# Patient Record
Sex: Male | Born: 1950 | Race: White | Hispanic: No | Smoking: Former smoker
Health system: Southern US, Community
[De-identification: ages and names within clinical notes are randomized; demographics above are authoritative.]

## PROBLEM LIST (undated history)

## (undated) DIAGNOSIS — J449 Chronic obstructive pulmonary disease, unspecified: Secondary | ICD-10-CM

## (undated) DIAGNOSIS — Z87442 Personal history of urinary calculi: Secondary | ICD-10-CM

## (undated) DIAGNOSIS — I10 Essential (primary) hypertension: Secondary | ICD-10-CM

## (undated) DIAGNOSIS — R0600 Dyspnea, unspecified: Secondary | ICD-10-CM

## (undated) DIAGNOSIS — I509 Heart failure, unspecified: Secondary | ICD-10-CM

## (undated) DIAGNOSIS — K219 Gastro-esophageal reflux disease without esophagitis: Secondary | ICD-10-CM

## (undated) HISTORY — PX: CARPAL TUNNEL RELEASE: SHX101

## (undated) HISTORY — DX: Heart failure, unspecified: I50.9

## (undated) HISTORY — PX: FRACTURE SURGERY: SHX138

---

## 2003-02-11 ENCOUNTER — Encounter: Payer: Self-pay | Admitting: Emergency Medicine

## 2003-02-11 ENCOUNTER — Inpatient Hospital Stay (HOSPITAL_COMMUNITY): Admission: AC | Admit: 2003-02-11 | Discharge: 2003-03-15 | Payer: Self-pay

## 2004-02-12 ENCOUNTER — Inpatient Hospital Stay (HOSPITAL_COMMUNITY): Admission: RE | Admit: 2004-02-12 | Discharge: 2004-02-15 | Payer: Self-pay | Admitting: Orthopedic Surgery

## 2004-02-12 ENCOUNTER — Ambulatory Visit: Payer: Self-pay | Admitting: Internal Medicine

## 2004-02-13 ENCOUNTER — Ambulatory Visit: Payer: Self-pay | Admitting: Plastic Surgery

## 2004-03-02 ENCOUNTER — Ambulatory Visit: Payer: Self-pay | Admitting: Internal Medicine

## 2004-03-23 ENCOUNTER — Ambulatory Visit: Payer: Self-pay | Admitting: Internal Medicine

## 2004-05-11 ENCOUNTER — Inpatient Hospital Stay (HOSPITAL_COMMUNITY): Admission: RE | Admit: 2004-05-11 | Discharge: 2004-05-13 | Payer: Self-pay | Admitting: Orthopedic Surgery

## 2004-05-12 ENCOUNTER — Ambulatory Visit: Payer: Self-pay | Admitting: Physical Medicine & Rehabilitation

## 2004-06-01 ENCOUNTER — Ambulatory Visit: Payer: Self-pay | Admitting: Internal Medicine

## 2004-08-27 ENCOUNTER — Ambulatory Visit (HOSPITAL_COMMUNITY): Admission: RE | Admit: 2004-08-27 | Discharge: 2004-08-27 | Payer: Self-pay | Admitting: Orthopedic Surgery

## 2004-11-24 ENCOUNTER — Ambulatory Visit: Payer: Self-pay | Admitting: Internal Medicine

## 2004-12-08 ENCOUNTER — Ambulatory Visit: Payer: Self-pay | Admitting: Internal Medicine

## 2005-01-11 ENCOUNTER — Ambulatory Visit: Payer: Self-pay | Admitting: Internal Medicine

## 2005-01-18 ENCOUNTER — Ambulatory Visit (HOSPITAL_COMMUNITY): Admission: RE | Admit: 2005-01-18 | Discharge: 2005-01-18 | Payer: Self-pay | Admitting: Orthopedic Surgery

## 2005-02-15 ENCOUNTER — Ambulatory Visit: Payer: Self-pay | Admitting: Internal Medicine

## 2005-04-19 ENCOUNTER — Ambulatory Visit: Payer: Self-pay | Admitting: Internal Medicine

## 2005-05-19 ENCOUNTER — Encounter
Admission: RE | Admit: 2005-05-19 | Discharge: 2005-08-17 | Payer: Self-pay | Admitting: Physical Medicine & Rehabilitation

## 2005-05-19 ENCOUNTER — Ambulatory Visit: Payer: Self-pay | Admitting: Physical Medicine & Rehabilitation

## 2005-07-26 ENCOUNTER — Ambulatory Visit: Payer: Self-pay | Admitting: Internal Medicine

## 2005-08-10 ENCOUNTER — Ambulatory Visit: Payer: Self-pay | Admitting: Physical Medicine & Rehabilitation

## 2005-09-28 ENCOUNTER — Ambulatory Visit: Payer: Self-pay | Admitting: Physical Medicine & Rehabilitation

## 2005-09-28 ENCOUNTER — Encounter
Admission: RE | Admit: 2005-09-28 | Discharge: 2005-12-27 | Payer: Self-pay | Admitting: Physical Medicine & Rehabilitation

## 2005-11-03 ENCOUNTER — Ambulatory Visit: Payer: Self-pay | Admitting: Physical Medicine & Rehabilitation

## 2005-12-27 ENCOUNTER — Encounter
Admission: RE | Admit: 2005-12-27 | Discharge: 2006-03-27 | Payer: Self-pay | Admitting: Physical Medicine & Rehabilitation

## 2005-12-27 ENCOUNTER — Ambulatory Visit: Payer: Self-pay | Admitting: Physical Medicine & Rehabilitation

## 2006-02-21 ENCOUNTER — Ambulatory Visit: Payer: Self-pay | Admitting: Physical Medicine & Rehabilitation

## 2006-04-18 ENCOUNTER — Ambulatory Visit: Payer: Self-pay | Admitting: Physical Medicine & Rehabilitation

## 2006-04-18 ENCOUNTER — Encounter
Admission: RE | Admit: 2006-04-18 | Discharge: 2006-07-17 | Payer: Self-pay | Admitting: Physical Medicine & Rehabilitation

## 2006-07-18 ENCOUNTER — Encounter
Admission: RE | Admit: 2006-07-18 | Discharge: 2006-10-16 | Payer: Self-pay | Admitting: Physical Medicine & Rehabilitation

## 2006-07-18 ENCOUNTER — Ambulatory Visit: Payer: Self-pay | Admitting: Physical Medicine & Rehabilitation

## 2006-11-07 ENCOUNTER — Ambulatory Visit: Payer: Self-pay | Admitting: Physical Medicine & Rehabilitation

## 2006-11-07 ENCOUNTER — Encounter
Admission: RE | Admit: 2006-11-07 | Discharge: 2006-11-09 | Payer: Self-pay | Admitting: Physical Medicine & Rehabilitation

## 2007-03-06 ENCOUNTER — Encounter
Admission: RE | Admit: 2007-03-06 | Discharge: 2007-03-07 | Payer: Self-pay | Admitting: Physical Medicine & Rehabilitation

## 2007-03-06 ENCOUNTER — Ambulatory Visit: Payer: Self-pay | Admitting: Physical Medicine & Rehabilitation

## 2007-07-03 ENCOUNTER — Encounter
Admission: RE | Admit: 2007-07-03 | Discharge: 2007-07-30 | Payer: Self-pay | Admitting: Physical Medicine & Rehabilitation

## 2007-07-26 ENCOUNTER — Ambulatory Visit: Payer: Self-pay | Admitting: Physical Medicine & Rehabilitation

## 2007-08-29 ENCOUNTER — Encounter
Admission: RE | Admit: 2007-08-29 | Discharge: 2007-08-29 | Payer: Self-pay | Admitting: Physical Medicine & Rehabilitation

## 2007-11-16 ENCOUNTER — Encounter
Admission: RE | Admit: 2007-11-16 | Discharge: 2007-12-07 | Payer: Self-pay | Admitting: Physical Medicine & Rehabilitation

## 2007-12-07 ENCOUNTER — Ambulatory Visit: Payer: Self-pay | Admitting: Physical Medicine & Rehabilitation

## 2008-03-31 ENCOUNTER — Encounter
Admission: RE | Admit: 2008-03-31 | Discharge: 2008-04-18 | Payer: Self-pay | Admitting: Physical Medicine & Rehabilitation

## 2008-04-18 ENCOUNTER — Ambulatory Visit: Payer: Self-pay | Admitting: Physical Medicine & Rehabilitation

## 2008-06-17 ENCOUNTER — Encounter
Admission: RE | Admit: 2008-06-17 | Discharge: 2008-09-15 | Payer: Self-pay | Admitting: Physical Medicine & Rehabilitation

## 2008-06-18 ENCOUNTER — Ambulatory Visit: Payer: Self-pay | Admitting: Physical Medicine & Rehabilitation

## 2008-07-18 ENCOUNTER — Ambulatory Visit: Payer: Self-pay | Admitting: Physical Medicine & Rehabilitation

## 2008-08-12 ENCOUNTER — Ambulatory Visit: Payer: Self-pay | Admitting: Physical Medicine & Rehabilitation

## 2008-08-12 ENCOUNTER — Encounter
Admission: RE | Admit: 2008-08-12 | Discharge: 2008-10-21 | Payer: Self-pay | Admitting: Physical Medicine & Rehabilitation

## 2008-10-21 ENCOUNTER — Ambulatory Visit: Payer: Self-pay | Admitting: Physical Medicine & Rehabilitation

## 2008-11-14 ENCOUNTER — Encounter
Admission: RE | Admit: 2008-11-14 | Discharge: 2009-02-05 | Payer: Self-pay | Admitting: Physical Medicine & Rehabilitation

## 2008-11-18 ENCOUNTER — Ambulatory Visit: Payer: Self-pay | Admitting: Physical Medicine & Rehabilitation

## 2008-12-17 ENCOUNTER — Encounter
Admission: RE | Admit: 2008-12-17 | Discharge: 2009-03-17 | Payer: Self-pay | Admitting: Physical Medicine & Rehabilitation

## 2008-12-17 ENCOUNTER — Ambulatory Visit: Payer: Self-pay | Admitting: Physical Medicine & Rehabilitation

## 2009-01-14 ENCOUNTER — Ambulatory Visit: Payer: Self-pay | Admitting: Physical Medicine & Rehabilitation

## 2009-02-12 ENCOUNTER — Ambulatory Visit: Payer: Self-pay | Admitting: Physical Medicine & Rehabilitation

## 2009-03-04 ENCOUNTER — Encounter
Admission: RE | Admit: 2009-03-04 | Discharge: 2009-04-28 | Payer: Self-pay | Admitting: Physical Medicine & Rehabilitation

## 2009-03-13 ENCOUNTER — Ambulatory Visit: Payer: Self-pay | Admitting: Physical Medicine & Rehabilitation

## 2009-04-15 ENCOUNTER — Ambulatory Visit: Payer: Self-pay | Admitting: Physical Medicine & Rehabilitation

## 2009-05-07 ENCOUNTER — Encounter
Admission: RE | Admit: 2009-05-07 | Discharge: 2009-08-04 | Payer: Self-pay | Admitting: Physical Medicine & Rehabilitation

## 2009-05-10 ENCOUNTER — Emergency Department: Payer: Self-pay | Admitting: Internal Medicine

## 2009-07-09 ENCOUNTER — Ambulatory Visit: Payer: Self-pay | Admitting: Physical Medicine & Rehabilitation

## 2009-08-04 ENCOUNTER — Encounter
Admission: RE | Admit: 2009-08-04 | Discharge: 2009-10-26 | Payer: Self-pay | Admitting: Physical Medicine & Rehabilitation

## 2009-08-07 ENCOUNTER — Ambulatory Visit: Payer: Self-pay | Admitting: Physical Medicine & Rehabilitation

## 2009-09-10 ENCOUNTER — Ambulatory Visit: Payer: Self-pay | Admitting: Physical Medicine & Rehabilitation

## 2009-10-15 ENCOUNTER — Ambulatory Visit: Payer: Self-pay | Admitting: Physical Medicine & Rehabilitation

## 2009-10-26 ENCOUNTER — Encounter
Admission: RE | Admit: 2009-10-26 | Discharge: 2009-11-03 | Payer: Self-pay | Admitting: Physical Medicine & Rehabilitation

## 2009-11-03 ENCOUNTER — Ambulatory Visit: Payer: Self-pay | Admitting: Physical Medicine & Rehabilitation

## 2010-09-14 NOTE — Assessment & Plan Note (Signed)
Theodore Obrien returns to clinic today for followup evaluation.  The patient  reports that he has been approved for disability.  He reports that he is  allowed to do a minimum amount of work and get paid in addition to his  disability payments.  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   In terms of his pain medicines, he is using his OxyContin CR 40 mg twice  a day and the oxycodone 5 mg up to 8 tablets per day.  He does need a  refill on each of those over the next couple of weeks.   MEDICATIONS:  1. OxyContin extended release 40 mg q.12 h.  2. Oxycodone immediate release 5 mg 1-2 tablets p.o. q.i.d. p.r.n. (6-      8 per day).  3. Neurontin 600 mg q.i.d.   REVIEW OF SYSTEMS:  Positive for night sweats, high blood sugar, nausea,  urinary retention, and poor appetite.   PHYSICAL EXAMINATION:  GENERAL:  Well-appearing adult male in mild to no  acute discomfort.  VITAL SIGNS:  Blood pressure was 187/101 in the left upper extremity and  155/87 in the right upper extremity.  Pulse was 110 with respiratory  rate of 22, and O2 saturation 97% on room air.  EXTREMITIES:  He ambulates without any assistive device.   IMPRESSION:  1. Persistent severe pain of the right foot with numbness of the feet      bilaterally to knee level.  2. Status post left carpal tunnel release with planned right carpal      tunnel release.   In the office today, we did refill the patient's oxycodone immediate  release and continuous release each as of December 21, 2007.  We will plan  on seeing the patient in follow up in this office in approximately 3-4  months time with refills prior to that appointment as necessary.  The  patient continues to get good analgesic effect overall without signs of  diversion with significant side effects.           ______________________________  Theodore Obrien, M.D.     DC/MedQ  D:  12/07/2007 11:45:28  T:  12/08/2007 05:14:13  Job #:  [id number]

## 2010-09-14 NOTE — Assessment & Plan Note (Signed)
Mr. Huhn was a prior patient of Dr. Thomasena Edis.  He is back in followup of  his painful peripheral neuropathy and prior right tibial fracture.  He  suffered his neuropathy due to side effects of antibiotic therapy.  The  patient is currently taking OxyContin 40 mg q.12 h and oxycodone 5 mg 1-  2 q.6 h p.r.n..  He needs usually 8 a day to cover his pain.  He is on  Neurontin 600 mg q.i.d. which helps somewhat as well.  Says he has tried  Lyrica before without significant benefit.  He also has tried Elavil at  some point at low dose.  The patient rates his pain as 7/10.  Described  as aching and burning.  Pain interferes with general activity, relations  with others, and enjoyment of life on a moderate-to-severe level.  Sleep  is poor.  He can walk about 10 minutes at a time due to foot pain.   REVIEW OF SYSTEMS:  Notable for numbness, trouble walking, tingling,  dizziness, confusion, depression, anxiety, nausea, urine retention,  weight gain.  Full review is in the written health and history section.  Other pertinent positives are above.   SOCIAL HISTORY:  Essentially is unchanged.  The patient has been on  disability since this summer.  He was unable to work due to his  problems.   PHYSICAL EXAMINATION:  VITAL SIGNS:  Blood pressure is 164/75, pulse is  101, respiratory rate 18.  He is sating 98% on room air.  GENERAL:  The patient is generally pleasant, alert and oriented x3.  HEART:  Regular rhythm, slightly tachycardiac.  CHEST:  Clear.  ABDOMEN:  Soft, nontender.  EXTREMITIES:  Strength is generally 5/5 in all 4 extremities.  He has  decreased pinprick and light touch in both hands in the distal fingers.  He has decreased sensation essentially below the ankle on either foot.  I grade that sensation to trace out of 2 in general.  There is no  obvious color abnormalities or symmetry abnormalities.  Pulses are 2+.  He has antalgic gait on either leg.  NEUROLOGIC:  Cognitively, he is  appropriate.   ASSESSMENT:  1. 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  2. History of right tibial fracture.  3. History of bilateral carpal tunnel syndrome.   PLAN:  1. I told Helen that our goal would be to decrease his oxycodone      breakthrough use.  I really do not want to increase him to 15 mg      breakthrough medication at this point.  He is on OxyContin 40 mg      q.12 h.  We will stay with the same schedule medications for now.      We will continue with Neurontin 600 mg q.i.d.  I did start Voltaren      gel t.i.d. to hand and feet.  2. We will add Topamax 25 mg at bedtime titrating up to 75 mg over 2      weeks' time.  3. I will have him follow up with Nurse [facility] in 1 month and with me      in 2 months' time.      Ranelle Oyster, M.D.  Electronically Signed     ZTS/MedQ  D:  07/18/2008 09:53:05  T:  07/19/2008 00:01:15  Job #:  [id number]

## 2010-09-14 NOTE — Assessment & Plan Note (Signed)
[name] returns to the clinic today for followup evaluation.  He  reports that he is doing well.  He does need a refill on his OxyContin  and oxycodone in the office today.  He is getting good relief overall  with the medication.  He does have a sufficient supply of Neurontin with  1 refill at present.   MEDICATIONS:  1. OxyContin extended release 40 mg q.12 h.  2. Oxycodone immediate release 5 mg 1-2 tablets p.o. q.i.d. p.r.n.      (generally 8 per day).  3. Neurontin 600 mg q.i.d.   REVIEW OF SYSTEMS:  Positive for urinary retention.   PHYSICAL EXAMINATION:  GENERAL:  Well-appearing, middle-aged adult male,  in mild to no acute discomfort.  VITAL SIGNS:  Blood pressure 145/84 with pulse 97, respiratory rate 18,  and O2 saturation 97% on room air.  He ambulates without any assistive  device.  He has 4+ to 5-/5 strength throughout.   IMPRESSION:  1. Persistent severe pain at the right foot with numbness of the feet      bilaterally to knee level.  2. Status post left carpal tunnel release with planned, right carpal      tunnel release.   In the office today, we did refill the patient's OxyContin CR and his  OxyContin immediate release, each as of today April 18, 2008.  No  refill on the Neurontin is necessary.  We will plan on seeing him in  follow up in approximately 3 months' time.  He continues to get good  analgesic effect without signs of diversion or significant side effects.           ______________________________  Ellwood Dense, M.D.     DC/MedQ  D:  04/18/2008 13:39:46  T:  04/19/2008 05:44:15  Job #:  [id number]

## 2010-09-14 NOTE — Assessment & Plan Note (Signed)
NARRATIVE:  Mr. Massmann returned to the clinic today for followup  evaluation.  The new job that he has with [employer] has been causing more  stress.  He is having a lot of stress related to poor software at work.  He also has to do a lot more walking between buildings, and both of  those have resulted in slightly increased use of the immediate release  oxycodone.  He does need a refill on each of the medicines starting  tomorrow.  His feet have felt worse with increased walking, possibly  related to the stress also.   MEDICATIONS:  1. OxyContin extended release 40 mg q.12 h.  2. Oxycodone immediate release 5 mg, 1-2 tablets p.o. t.i.d. p.r.n. (6-      8 per day).  3. Neurontin 600 mg q.i.d.   REVIEW OF SYSTEMS:  Positive for nausea.   PHYSICAL EXAMINATION:  GENERAL:  A well-appearing fit adult male in mild  acute discomfort.  VITAL SIGNS:  Not obtained in the office today.  MUSCULOSKELETAL:  He has 5/5 strength throughout.   IMPRESSION:  1. Persistent severe pain of the right foot with numbness of the feet      bilaterally to the knee level.  2. Status post carpal tunnel release with planned right carpal tunnel      release.  3. In the office today, we did refill the patient's extended release      oxycodone and his immediate release oxycodone.  We increased him up      to 8 tablets per day of the immediate release on an as-needed      basis.  Each of those will be filled as of tomorrow, March 08, 2007.   FOLLOWUP:  We will plan on seeing him in followup in approximately 4  months' time.           ______________________________  Ellwood Dense, M.D.     DC/MedQ  D:  03/07/2007 09:11:19  T:  03/07/2007 14:55:39  Job #:  [id number]

## 2010-09-14 NOTE — Assessment & Plan Note (Signed)
Mr. Blatz returns to the clinic today for followup evaluation. 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 He has  been working with them for three months and doing well. He reports that  he continues to get good relief from his pain medicines noted below. He  is off Cymbalta and reports no adverse effects off the medication  compared to on the medication. He reports that his pain is still present  in his bilateral feet, especially if he is up on his feet walking long  distances. He does report that the pain is variable and intermittent.   MEDICATIONS:  1. OxyContin extended release 40 mg q12 hours.  2. Oxycodone immediate release 5 mg 1-2 tablets p.o. t.i.d. p.r.n.  (6      per day).  3. Neurontin 600 mg q.i.d.   REVIEW OF SYSTEMS:  Noncontributory.   PHYSICAL EXAMINATION:  Well-appearing, fit, adult male in mild to no  acute discomfort. Blood pressure 161/103 with a pulse of 81, respiratory  rate 17 and O2 saturation is 94% on room air.  The patient has 5/5 strength throughout the bilateral upper and lower  extremities.   IMPRESSION:  1. Persistent severe pain of the right foot with numbness of the feet      bilaterally to knee level.  2. Status post left carpal tunnel release with planned right carpal      tunnel release.  3. In the office today, we did refill the patient's OxyContin      sustained release along with his immediate release and Neurontin      each as of November 13, 2006. Will plan on seeing him in followup in      approximately 4 months time with refills prior to that appointment      as necessary.           ______________________________  Ellwood Dense, M.D.     DC/MedQ  D:  11/09/2006 09:55:22  T:  11/09/2006 19:06:52  Job #:  [id number]

## 2010-09-14 NOTE — Assessment & Plan Note (Signed)
Theodore Obrien is back regarding his painful peripheral neuropathy.  He has  been generally under fair control.  He was on vacation and ran out of  his OxyContin and had some withdrawal symptoms.  He has been taking  Topamax and has noticed a big difference and has had no side effects  worth mentioning except for a bit of dry mouth.  He reports his pain is  7-9/10, describes it as tingling and burning.  Pain interferes with  general activity, relations with others, enjoyment of life on a moderate  level.  Sleep is fair.   REVIEW OF SYSTEMS:  Notable for the above, loss and dizziness,  occasional nausea, and some weight loss.  Full 14-point review is in the  written health and history section above.   SOCIAL HISTORY:  Unchanged.  He is still on disability and trying to  workout longer-term health insurance.   PHYSICAL EXAMINATION:  Blood pressure is 168/83, pulse is 104,  respiratory rate 18.  He is sating 96% on room air.  The patient is  pleasant, alert and oriented x3.  Affect is bright and appropriate.  Gait is essentially stable.  He had decreased in pinprick in the distal  hands as well as the feet and stocking-glove distribution.  Sensation is  trace to 1/2.  No skin color changes noted.  Pulses are 2+.  He has some  generalized antalgia on either leg with walking.  Cognitively, he is  appropriate.   ASSESSMENT:  1. 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  2. History of right tibial fracture.  3. Bilateral carpal tunnel syndrome.   PLAN:  1. We will increase Topamax to 100 mg h.s.  titrating up to 200 mg as      tolerated to see if he has an effect.  2. We will continue OxyContin 40 mg q.12 h. #60 and oxycodone 5 mg 1-2      q.6 h. p.r.n. #240.  3. Gave the patient literature regarding a spinal stimulator which he      will review.  4. Continue Neurontin 600 mg q.i.d.  5. We will see him back in 3 months with me.      Theodore Obrien, M.D.  Electronically Signed     ZTS/MedQ  D:  10/21/2008 10:01:07  T:  10/21/2008 84:69:62  Job #:  [id number]

## 2010-09-14 NOTE — Assessment & Plan Note (Signed)
Mr. Maye returns to the clinic today for followup evaluation.  He  reports that he was laid off and actually released in late December 2008  to early January 2009.  He reports that he has applied for disability as  he does not feel that he can continue to work secondary to the pain from  his right foot and also the pain medications that he takes.  He reports  that he was sent to a psychiatrist by Social Security/Disability for  memory testing.  He also reports that he may be hiring an attorney to  help him with his disability.  The disability people have told him that  the initial decision may take 4-6 months and he is prepared for that at  this time.  He is also reporting that he is planning to marry probably  in April 2009.   MEDICATIONS:  1. Oxycodone extended release 40 mg q.12 hours.  2. Oxycodone immediate release 5 mg one to two tablets p.o. q.i.d.      p.r.n. (6-8 per day).  3. Neurontin 600 mg q.i.d.   REVIEW OF SYSTEMS:  Noncontributory.   PHYSICAL EXAMINATION:  GENERAL:  Well-appearing thin adult male.  Mild  acute discomfort.  VITAL SIGNS:  Blood pressure 145/99, pulse 83, respiratory rate 18, O2  saturation 98% on room air.  He is 5-/5 strength throughout.  He  ambulates without any assistive device.   IMPRESSION:  1. Persistent severe pain of the right foot with numbness of the feet      bilaterally to knee level.  2. Status post carpal tunnel release with planned right carpal tunnel      release.   In the office today, we did refill the patient's oxycodone immediate  release and continuous release as of today, July 30, 2007.  That is  slightly early from his last refill done June 05, 2007.  He reports  that he has been slightly sick and nauseated recently with some vomiting  and he has taken slightly increased amounts of medications secondary to  the vomiting.   We will plan to see the patient in followup in approximately 4 months'  time.  Refills of his  medications will be completed prior to that office  visit.           ______________________________  Ellwood Dense, M.D.     DC/MedQ  D:  07/30/2007 13:33:15  T:  07/30/2007 14:06:32  Job #:  [id number]
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
## 2010-09-17 NOTE — Assessment & Plan Note (Signed)
Theodore Obrien returns to clinic today for follow-up evaluation. I last saw  him in this office February 22, 2006. Since that time he had carpel  tunnel surgery on the left with Dr. Carola Frost, April 16, 2006. He did use  a few extra immediate release oxycodone initially after that surgery  this week. He does not need a refill on his Neurontin, but does need a  refill on his immediate release oxycodone along with his extended-  release oxycodone. He reports that he was laid off from his work at  [employer] approximately Thanksgiving 2007. He is in the process of trying  to set up [employer]. He still needs to have carpel tunnel release  on the right side in the near future.   MEDICATIONS:  1. Oxycodone extended release 40 mg q. 12 hours.  2. Oxycodone immediate release 5 mg 1-2 tablets p.o. t.i.d. p.r.n.  3. Cymbalta 60 mg q. day.  4. Neurontin 600 mg q.i.d.   REVIEW OF SYSTEMS:  Positive for nausea along with urinary retention.   PHYSICAL EXAMINATION:  A well-appearing adult male in mild-to-no acute  discomfort. Blood pressure 162/100 with pulse of 82, respiratory rate  16, and O2 saturation of 97% on room air. He has 4/5 strength throughout  the bilateral upper extremities with some decreased strength in the left  wrist where he has wrist splint in place. He also has a splint present  on the right wrist. Lower extremity exam showed 5/5 strength throughout.   IMPRESSION:  1. Persistent severe pain of the right foot along with numbness of the      feet bilaterally through knee level.  2. Status post left carpel tunnel release with imminent right carpel      tunnel release planned.   At the present time we have refilled his oxycodone immediate release  along with his extended release as of April 27, 2006. Will plan on  seeing the patient in followup in approximately two to three months with  refills prior to that appointment if necessary. He continues to be very  compliant with all  restrictions regarding narcotic use.           ______________________________  Theodore Obrien, M.D.     DC/MedQ  D:  04/20/2006 10:39:07  T:  04/20/2006 12:43:50  Job #:  [id number]
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
## 2010-09-17 NOTE — Op Note (Signed)
NAME:  [name], [name] NO.:  [id number]   MEDICAL RECORD NO.:  [id number]          PATIENT TYPE:  AMB   LOCATION:  SDS                          FACILITY:  [facility]   PHYSICIAN:  Doralee Albino. Carola Frost, M.D. 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   PROCEDURE:  1.  Removal of right tibial nail (implant, deep).  2.  Intramedullary debridement with reaming and curettage of all locking      bolt sites.   SURGEON:  Myrene Galas, M.D.   ASSISTANT:  Peter Garter, PA   ANESTHESIA:  General.   COMPLICATIONS:  None.   SPECIMENS:  Four specimens, 2 from the intramedullary nail fibrous tissue  and 2 from tibial reamings.   DRAINS:  None.   DISPOSITION:  To PACU.   CONDITION:  Stable.   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  This included  placement of antibiotic rod, osteotomy coupled with nailing and iliac crest  bone grafting, and exchange nailing.  Throughout this time, he has been on  antibiotics and has been followed by Dr. Orvan Falconer from Infectious Disease.  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  After discussion with Dr. Orvan Falconer and following  serial x-rays, we offered Theodore Obrien removal of the intramedullary nail with  cast bracing and continuation of the antibiotics for 10 days or so.  This  was based upon the clinical union which he had attained including total lack  of tenderness, complete return to function, ability to jump without any pain  and no symptoms after prolonged activity.  In spite of this, he did not have  convincing radiographic union, but again, Dr. Orvan Falconer felt that  discontinuation of antibiotics was a pressing need.  After full discussion  of the risks and benefits including the possibility of refracture, continued  infection or recurrent infection and possible need for further surgery, the  patient wished to proceed up.  He understood thromboembolic risk as well as  other perioperative risk.   BRIEF DESCRIPTION OF PROCEDURE:  Theodore Obrien was taken to the operating room  after receiving preoperative vancomycin.  His right lower extremity was  prepped and draped in the usual sterile fashion.  No tourniquet was used.  The old incision was remade proximally and the proximal aspect of the tibial  nail cleaned with a curette to remove any fibrinous material.  Next, the  locking bolts were all withdrawn with the exception of partial withdrawal of  one of the distal locks to prevent rotation during engagement of the  extraction device in the proximal aspect of the nail.  The extraction device  was engaged, the locking bolt removed and then the nail tracked without  complication.  It should be noted that the distal locking bolts were well  affixed and that the proximal locking bolt was partially threaded was felt  to have purchase as well.  No purulence was encountered and no significant  or unusual drainage.  A guidewire was then placed into the canal and  sequential reaming performed from 11.5 to 13.5, which was the amount of  reaming the patient had previously undergone.  After removal of the nail, we  sent cultures from the debris within the distal aspect of the nail; we also  sent another set of cultures from the tibial reamings after the 13-mm reamer  passage.  The bone felt solid during reaming, there was no gross motion and  copious irrigation was performed, allowing egress through the distal locking  bolts.   The entire tibia was then scanned with multiple fluoro images to  ensure no complications and to obtain a baseline picture of the fracture  site as well.  Again, it appeared to be bridged nicely on 4 cortices.  The  patient's wounds were then irrigated and closed in a standard layered  fashion with PDS and nylon.  A sterile gently compressive dressing was  applied and the patient taken to the PACU in stable condition.   PROGNOSIS:  Theodore Obrien appears to have resolved his infection, based on  negative inflammatory markers preoperatively, well-fixed locking bolts at  removal and the absence of any purulent debris.  The reaming and debridement  of his bone and locking bolt should further reduce his bacterial load if one  is present.  We are hopeful we can go on and completely resolve this  infection, as he has sustained significant side-effects from his antibiotic  regimen.  We will allow him to begin progressive weightbearing with external  support either from his Cam boot or the clamshell tibial orthosis which has  been ordered for him.  I will discontinued the antibiotics in another 10-14  days and follow up with me in approximately 10 days for removal of his  sutures.  He will not receive any formal DVT prophylaxis because of his  continued mobilization.  We still remain concerned about the possibility of  persistent infection as well as refracture.      Doralee Albino. Carola Frost, M.D.  Electronically Signed     MHH/MEDQ  D:  01/18/2005  T:  01/19/2005  Job:  [id number]

## 2010-09-17 NOTE — Assessment & Plan Note (Signed)
Theodore Obrien returns to the Clinic today ahead of his previously scheduled  appointment.  He is reporting increased pain on an occasional basis,  especially severe in the medial aspect of his left foot.  We have been using  OxyContin sustained release 40 mg q.12 h. and Oxycodone 5 mg up to 5 tablets  per day on an as needed basis.  He also uses Neurontin, and that dose has  recently been increased to 600 mg q.i.d.  We are reluctant to increase his  pain medications, explicit that there are no narcotics any further.  He is  not sure if he has ever tried Lyrica, although he feels he probably tried  some samples in the past.  I would like to at least try that medication to  see if we can help him with his intermittent pain.  Unfortunately, we may  eventually need to increase his narcotic dosage, but will try to avoid that  at least for now.   MEDICATIONS:  1.  OxyContin sustained release 4 mg q.12 h.  2.  Oxycodone 5 mg 1-2 tablets p.o. q.4-6 h. p.r.n. (approximately 5 per      day).  3.  Cymbalta 60 mg q.d.  4.  Gabapentin 600 mg q.i.d.   REVIEW OF SYSTEMS:  Positive for urinary retention.   PHYSICAL EXAMINATION:  GENERAL:  Well-appearing, fit adult male; in mild to  no acute discomfort.  VITAL SIGNS:  Blood pressure 135/83, pulse 77, respirations 16, O2  saturations 95% on room air.  EXTREMITIES:  He has 5/5 strength throughout the bilateral upper and lower  extremities.  He complains of severe pain along the medial aspect of his  left foot.  He ambulates without any assistive device.   IMPRESSION:  Persistent, severe pain of the right foot; along with numbness  at the bottom of the legs, up to the level of his knees.   PLAN:  No refill on his OxyContin or Ocycodone medication is necessary.  We  did give him samples of Lyrica at 50 mg, to use twice a day.  We will see  how he does on this medication over the next month.  We would like to see  him a follow-up in approximately 1 month's  time.           ______________________________  Ellwood Dense, M.D.     DC/MedQ  D:  09/29/2005 10:49:53  T:  09/29/2005 13:26:31  Job #:  [id number]

## 2010-09-17 NOTE — Assessment & Plan Note (Signed)
Theodore Obrien returned to the clinic today for follow-up evaluation.  He reports  that overall he still has persistent burning and tingling of his feet.  He  complains of them feeling like he is touching cacti.  He reports that he  continues to take is Cymbalta at 60 mg daily.  He reports that he gets good  relief with his pain medicines and they help him tolerate the pain that he  does have.  He reports that he could not take his pain medicine for a few  days secondary to nausea related to GI upset.  He noted that he had  increased pain when he was not taking his immediate-release and extended-  release Oxycodone.   MEDICATIONS:  1. Oxycodone extended-release 40 mg q.12 hours.  2. Oxycodone immediate-release 5 mg one to two tablets p.o. q.4-6 hours      (approximately 5-6 per days).  3. Cymbalta 60 mg daily.  4. Gabapentin 600 mg q.i.d.   REVIEW OF SYSTEMS:  Positive for urinary retention and nausea.   PHYSICAL EXAMINATION:  GENERAL:  A well-appearing, fit adult male in mild to  no acute discomfort.  VITAL SIGNS:  Blood pressure 150/80 pulse 92, respiratory rate 18, and O2  saturation 95% on room air.  He has 5/5 strength throughout the bilateral  upper and lower extremities.  Bulk and tone were normal.   IMPRESSION:  Persistent severe pain in the right foot along with numbness of  his feet bilaterally through knee level.   In the office today we did refill the patient's Oxycodone sustained-release  and Oxycodone Immediate-release as noted above.  He reports that he has a  sufficient supply of Neurontin at this point.  We will plan on seeing him in  follow-up in 2-3 months' time with refills prior to that appointment as  necessary.           ______________________________  Ellwood Dense, M.D.     DC/MedQ  D:  12/28/2005 16:46:22  T:  12/29/2005 13:38:08  Job #:  [id number]
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
## 2010-09-17 NOTE — Assessment & Plan Note (Signed)
DATE OF VISIT:  November 07, 2005   REASON FOR VISIT:  [name] returns to the clinic today for followup  evaluation.  He reports that overall he is getting good relief from his  OxyContin sustained release 40 mg q.12h. and his p.r.n. oxycodone.  He  reports that he gained no relief with the Lyrica that we had given him as  samples.  He also reports that he had a lot of stomach aches with the  medication.  He would prefer not to continue that.  He continues to use the  Gabapentin 600 mg four times a day.   The patient is reporting no adverse side-effects from the oxycodone and  OxyContin medications.  He does report that he takes a fair amount of  vitamin C for his overall health.   MEDICATIONS:  1.  Oxycodone extended release 40 mg q.12h.  2.  Oxycodone immediate release 5 mg one to two tablets p.o. q.4-6h. p.r.n.      (approximately 5 per day).  3.  Cymbalta 60 mg daily.  4.  Gabapentin 600 mg four times a day.   REVIEW OF SYSTEMS:  Positive for nausea along with urinary retention.   PHYSICAL EXAMINATION:  GENERAL:  A well-appearing, fit, adult male in mild  to no acute discomfort.  VITAL SIGNS:  Blood pressure 157/81 with a pulse of 94, respirations 16, O2  saturations 96% on room air.  NEUROLOGIC:  He has 5/5 strength throughout the bilateral upper and lower  extremities.  He complains of severe right foot pain that was worse over the  past few days when he had to do some particular work at his place of  employment.  He was up on his feet for a prolonged period of time and doing  a fair amount of lifting.  Since that time, the pain has improved to some  degree.   ASSESSMENT/PLAN:  1.  Persistent severe pain of the right foot along with numbness of his feet      up through his knee levels.  2.  In the office today, we did refill the patient's oxycodone and allowed      him to use an average of 5 per day with variation allowable.  He is also      using the oxycodone extended  release at 40 mg q.12h. and that was      refilled for him in the office today, both as of November 09, 2005.  We will      plan on seeing him in followup in approximately 2 months' time.  He will      not be using the Lyrica, but will continue on the Gabapentin as noted      above.           ______________________________  Ellwood Dense, M.D.     DC/MedQ  D:  11/07/2005 14:06:56  T:  11/07/2005 16:27:01  Job #:  [id number]

## 2010-09-17 NOTE — Assessment & Plan Note (Signed)
Mr. Zehren returns to clinic today for followup evaluation.  He reports  that he is doing well overall.  He continues to take the 2 oxycodone  medications as noted below.  He does need refills on each of those over  the next couple of days.   CURRENT MEDICATIONS:  1. Oxycodone extended release 40 mg q. 12 hours.  2. Oxycodone immediate release 5 mg 1 to 2 tablets p.o. t.i.d. (6 per      day).  3. Cymbalta 60 mg daily.  4. Neurontin 600 mg q.i.d.   REVIEW OF SYSTEMS:  Noncontributory.   PHYSICAL EXAMINATION:  Well-appearing adult male, in no acute  discomfort.  Blood pressure 148/83 with pulse 89.  Respiratory rate 16,  and 02 saturation 94% on room air.  The patient has 5/5 strength in  bilateral upper and lower extremities.   IMPRESSION:  1. Persistent severe pain of the right foot with numbness of the feet      bilaterally to the knee level.  2. Status post left carpal tunnel release with planned right carpal      tunnel release.   In the office today, we did refill the patient's oxycodone sustained  release along with his oxycodone immediate release.  We will plan on  seeing the patient in followup in approximately 4 months' time with  medication refills as necessary prior to that appointment.           ______________________________  Ellwood Dense, M.D.     DC/MedQ  D:  07/19/2006 12:11:12  T:  07/19/2006 14:40:41  Job #:  [id number]

## 2010-09-17 NOTE — Op Note (Signed)
NAME:  Theodore Obrien NO.:  [id number]   MEDICAL RECORD NO.:  [id number]          PATIENT TYPE:  INP   LOCATION:  2550                         FACILITY:  [facility]   PHYSICIAN:  Doralee Albino. Carola Frost, M.D. DATE OF BIRTH:  1951-03-15   DATE OF PROCEDURE:  02/12/2004  DATE OF DISCHARGE:                                 OPERATIVE REPORT   PREOPERATIVE DIAGNOSIS:  Right tibial infected nonunion.   POSTOPERATIVE DIAGNOSIS:  Right tibial infected nonunion.   PROCEDURE:  1.  Irrigation and debridement of right tibia nonunion site with insertion      of an antibiotic cement rod.  2.  Irrigation and debridement of soft tissue abscess and free flap.  3.  Removal of hardware, right tibial Depuy nail.   SURGEON:  Doralee Albino. Carola Frost, M.D.   ASSISTANT:  Cecil Cranker, P.A.-C.   ANESTHESIA:  General.   COMPLICATIONS:  None.   TOURNIQUET TIME:  None.   ESTIMATED BLOOD LOSS:  150 mL.   DISPOSITION:  To the PACU.   SPECIMENS:  Four, first from the free flap abscess, second from bone  nonunion site, third from bone nonunion site, fourth from intramedullary  reaming, right tibia.   CONDITION:  Good.   INDICATIONS FOR PROCEDURE:  Theodore Obrien is a patient of Dr. Cornelius Moras referred  for treatment of an infected nonunion of a grade 3B open tibial shaft  fracture treated with intramedullary nailing in October 2004 with a soleus  flap done by Dr. Odis Luster.  The patient, over the past month, experienced  increasing pain, redness, and swelling of the right tibia.  MRI was obtained  which showed a soft tissue abscess.  The abscess did rupture on the day  prior to surgery.  After discussing the risks and benefits of surgery, the  patient did elect to proceed.   DESCRIPTION OF PROCEDURE:  Theodore Obrien was taken to the operating room without  the administration of preoperative antibiotics.  His right lower extremity  was prepped and draped in the usual sterile fashion.  After induction  of  general anesthesia, Dr. Odis Luster had previously marked the edge of the soleus  flap and the proper incision.  This was followed with dissection carried  down to the anterior crest of the tibia at the margin of the soleus flap in  the lateral compartment.  The periosteum was elevated revealing a pocket of  purulence which extended over the medial aspect of the tibia underneath the  free flap.  This communicated directly with the abscess above.  The nonunion  site was curetted aggressively and rongeur used to remove much of the  fibrous nonunion tissue.  It was scraped back to bleeding bone, both the  proximal and distal end.  The intramedullary nail was then removed after  first securing the traction device in the nail and removing the locking  screws.  The nail was backed out and the canal reamed sequentially up to 12  mm.  The reamings were sent to microbiology as were other cultures obtained  including bone.  PulsaVac was then used to  copiously irrigate the nonunion  site and flap as well as irrigation down the canal.  The nonunion site  measured approximately 2 cm.  Following this pulsatile irrigation, an  antibiotic impregnated 300 mm rod was inserted.  The cement contained both  vancomycin and tobramycin.  Following reaming, the patient was administered  IV vancomycin empirically.  The incisions were then irrigated after  obtaining AP and lateral x-rays and closed in a layered fashion with 2-0 PDS  and simple nylon sutures for the flap area and 2-0 PDS for the knee incision  and simple nylon sutures for the locking bolt incision.  A sterile, gentle  compressive dressing was applied.  The patient was awakened from anesthesia  and transferred to the PACU in stable condition.   PROGNOSIS:  We anticipate that we will obtain an organism from Theodore Obrien's  surgery.  We will plan for placement of a PICC line and will consult  infectious disease to assist with management of this infection.   A course of  6-8 weeks of treatment is anticipated with subsequent return to the OR for  repeat debridement of his fracture site, placement of BMP as well as iliac  crest autograft, and fibular osteotomy with acute shortening of the tibia.  Dr. Odis Luster and myself will examine his wound tomorrow and plan a course of  treatment for his flap damage which will most likely consist of wet to dry  dressings.  It may be that at the time of his acute bony shortening, he may  also undergo revision of his flap that will be facilitated by that decreased  length.  He will be placed on DVT prophylaxis after placement of his PICC  line and will remain non-weight bearing on that extremity.       MHH/MEDQ  D:  02/12/2004  T:  02/12/2004  Job:  [id number]

## 2010-09-17 NOTE — Assessment & Plan Note (Signed)
[name] is back regarding his peripheral neuropathy.  He notes that he  has been more active over the summer and in general feeling better, but  had this more foot pain due to activity.  He tried the Topamax, but did  not notice any substantial benefit and weaned himself off.  He remains  on OxyContin 40 mg q.12 h and oxycodone 5 mg 1-2 q.6 h p.r.n.  He read  up on the spinal stimulator and was not very interested and is a bit  afraid of such a procedure.  He continues on Neurontin 600 mg q.i.d.  Pain is 6-7/10.  He described as a sharp, constant, burning.  Pain  interferes with general activity, relations with others, enjoyment of  life on a moderate level.  Sleep is poor as his pain is most prominent  at night.   REVIEW OF SYSTEMS:  Notable for the above as well as some depression,  anxiety, occasional confusion.  Other pertinent positives are above and  full 14-point review is in the written health and history section of the  chart.   SOCIAL HISTORY:  The patient is married.  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  He feels that the  activity helps to keep his mind off his pain.   PHYSICAL EXAMINATION:  VITAL SIGNS:  Blood pressure is 145/86, pulse 89,  respiratory rate 16.  He is sating 98% on room air.  GENERAL:  The patient is pleasant, alert, and oriented x3.  EXTREMITIES:  He continues to have some decreased pinprick and sensation  in the distal limbs.  He has some antalgia with gait.  No skin or  temperature color changes are noted.  NEUROLOGIC:  Cognitively, he is well within normal limits.  HEART:  Regular rate.  CHEST:  Clear.  ABDOMEN:  Soft, nontender.   ASSESSMENT:  1. 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  2. History of right tibial fracture.  3. Bilateral carpal tunnel syndrome.   PLAN:  1. We will introduce nortriptyline 25 mg nightly for neuropathic pain      to see if he has any benefit with this.  He  will continue with his      Neurontin 600 mg q.i.d. as well.  Consider increasing Neurontin to      900 mg in the future.  2. Refilled OxyContin 40 mg q.12 h #60 and oxycodone 5 mg 1-2 q.6 h      p.r.n. #240.  3. I will see the patient back in about 3 months with Nursing Clinic      followup in 1 months' time.  The patient will call me if any      questions or issues with his medications.      Theodore Obrien, M.D.  Electronically Signed     ZTS/MedQ  D:  01/14/2009 12:43:22  T:  01/15/2009 04:14:32  Job #:  [id number]
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
## 2010-09-17 NOTE — Discharge Summary (Signed)
NAME:  Theodore Obrien, OVENS NO.:  [id number]   MEDICAL RECORD NO.:  [id number]          PATIENT TYPE:  INP   LOCATION:  5012                         FACILITY:  [facility]   PHYSICIAN:  Doralee Albino. Carola Frost, M.D. DATE OF BIRTH:  [birth year]   DATE OF ADMISSION:  02/12/2004  DATE OF DISCHARGE:  02/15/2004                                 DISCHARGE SUMMARY   ADMISSION DIAGNOSIS:  Infected nonunion right tibial fracture.   DISCHARGE DIAGNOSIS:  Infected nonunion right tibial fracture status post  removal of tibial nail, insertion of antibiotic spacer insertion and  irrigation and debridement of infected flap.   CONSULTATIONS OBTAINED:  Dr. Etter Sjogren of plastic surgery.   PROCEDURE PERFORMED:  On February 12, 2004, a removal of tibial nail,  antibiotic spacer insertion and I&D of infected flap without complications.   HISTORY:  [name] is a patient of Dr. Cornelius Moras who is referred to [place] for an  infected nonunion of a grade IIIB open tibial shaft fracture treated in  October of 2004 with an intramedullary nailing with a psoas flap done by Dr.  Odis Luster.  Over the past month or so, he has been experiencing some increased  pain, redness and swelling of his right tibia and an MRI was obtained which  showed an abscess in the soft tissue.  On the day prior to surgery, the  abscess was ruptured.  At this time, the patient is electing to proceed with  examination.   PHYSICAL EXAMINATION:  GENERAL:  This is a 60 year old white male who is  well-developed and well-nourished and in no acute distress.  HEENT:  Pupils equal and reactive to light and accommodation.  Extraocular  movements intact.  Normocephalic, atraumatic.  NECK:  Supple.  LUNGS:  Clear to auscultation bilaterally.  HEART:  A regular rate and rhythm with a normal S1 and S2.  ABDOMEN:  Positive bowel sounds, nontender and soft.  SKIN:  Warm and dry.  There is a flap covering his right tibia that appears  to be infected at this  time.  EXTREMITIES:  The patient has positive motor and sensory in all extremities  bilaterally as well as positive dorsal pedal pulses bilaterally.   COURSE IN THE HOSPITAL:  The patient was admitted on February 12, 2004,  underwent a removal of a tibial nail with an antibiotic spacer insertion and  had I&D of the infected flap, tolerated the procedure well without  complications.  Infectious disease was consulted at that time, Dr. Cliffton Asters.  Empiric vancomycin was started pending the status of the strains  and cultures.  The spacer that was inserted had tobramycin and vancomycin in  it.  On February 13, 2004, the patient without complaints or concerns.  He  states the pain is well controlled at this time.  He remains resting  comfortably in bed, positive sensory in his right lower extremity including  deep peroneal nerve and superficial peroneal nerve and tibial nerve,  positive motor bilaterally, positive dorsal pedal pulses bilaterally.  Calf  is soft and nontender.  Laboratories are within normal limits and  dressing  is intact.  The patient is nonweightbearing with PT and OT at this time.  Plastic is going to see him at a later date.  Plastic saw him later in the  day, recommended saline dressing q.i.d. and t.i.d. at home.  Report that  wound should heal and he is to follow up with him in approximately two  weeks.  On February 14, 2004, the patient was seen, reports doing quite well.  No changes in exam from previous day.  He remains afebrile, vital signs  stable.  On February 15, 2004, the patient is seen, no complaints, ready to  go home.  Feels comfortable doing dressing changes at home, remains  afebrile.  Gram's smear of cultures were negative for growth x2 days.  Still  waiting final recommendations from Infectious Disease as far as antibiotic  therapy.  He has been set up with home antibiotic with vancomycin at 2 grams  IV q.24h. and Avelox 500 mg p.o. daily.  The patient at  this point is  discharged.   DISCHARGE CONDITION:  Good.   DISPOSITION:  Discharged to home.   MEDICATIONS:  1.  Vancomycin 2 grams q.24h.  2.  Avelox 500 mg p.o. daily.  3.  Percocet 5/325 p.r.n. pain.   On February 13, 2004, PICC line was also inserted for the patient so he could  have home antibiotic therapy.   DISCHARGE INSTRUCTIONS:  The patient is nonweightbearing on the right lower  extremity.  Physical therapy and occupational therapy to work with range of  motion.  Diet is as tolerated.  Dressing changes t.i.d. to infected flap  area as per plastics. The patient is to follow up with Dr. Myrene Galas in  approximately 10 days at [phone] for an appointment.  Also follow up with  Dr. Odis Luster as well as Dr. Orvan Falconer in their respective clinics.      Robe   RWC/MEDQ  D:  03/24/2004  T:  03/25/2004  Job:  [id number]
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
## 2010-09-17 NOTE — Op Note (Signed)
NAME:  Theodore Obrien, Theodore Obrien NO.:  [id number]   MEDICAL RECORD NO.:  [id number]          PATIENT TYPE:  INP   LOCATION:  5031                         FACILITY:  [facility]   PHYSICIAN:  Doralee Albino. Carola Frost, M.D. DATE OF BIRTH:  [birth year]   DATE OF PROCEDURE:  DATE OF DISCHARGE:  05/13/2004                                 OPERATIVE REPORT   DATE OF PROCEDURE:  May 11, 2004.   PREOPERATIVE DIAGNOSES:  1.  Infected nonunion, right tibia.  2.  Retained antibiotic rod, right tibia.   POSTOPERATIVE DIAGNOSES:  1.  Infected nonunion, right tibia.  2.  Retained antibiotic rod, right tibia.   PROCEDURE:  1.  Repair of nonunion with iliac crest bone grafting.  2.  Intramedullary nailing, right tibia, with DePuy tibial nail, 11 mm x 345      mm statically locked.  3.  Removal of antibiotic drug delivery implant.  4.  Fibular osteotomy.  5.  Debridement of full thickness free flap wound including skin,      subcutaneous tissue, and muscle.   SURGEON:  Doralee Albino. Carola Frost, MD.   ASSISTANT:  Cecil Cranker, MD.   ANESTHESIA:  General.   COMPLICATIONS:  None.   ESTIMATED BLOOD LOSS:  200 mL.   DISPOSITION:  To PACU.   CONDITION:  Stable.   BRIEF SUMMARY OF INDICATION FOR PROCEDURE:  Theodore Obrien, Theodore Obrien., is a 60-year-  old white male, well known to the Orthopedic practice for his open tibia  fracture sustained over one year ago with late development of infection and  symptomatic nonunion.  He underwent irrigation and debridement of his  infected nonunion site with removal of his intramedullary nail and placement  of an antibiotic-impregnated intramedullary rod approximately 10 weeks ago.  He was treated on IV Vancomycin.  He also had an associated full thickness  defect in his flap that was treated with localized wound care.  His C-  reactive protein and sed rate have returned to baseline levels, and his soft  tissues had nearly resolved their infection, although he  did continue to  have a draining sinus, but there was no associated purulence.  He had a  large defect over a centimeter, and after discussion of the risks and  benefits, he elected to proceed with an iliac crest bone grafting for his  nonunion site, intramedullary nailing, and fibular osteotomy with acute  shortening to improve bone apposition.  He understood the risks to include  neurovascular injury, continued nonunion, persistent infection,  thromboembolic complications, and the possible need for further surgery.   BRIEF SUMMARY OF PROCEDURE:  [name] was taken to the operating room where  his right lower extremity was prepped and draped in the usual sterile  fashion.  He was administered preoperative antibiotics because we had,  despite numerous attempts, failed to obtain an organism even with aggressive  culturing and bone biopsy and specimens.  Through a 4-cm incision, approach  was made to his right iliac crest where we were able to harvest a  considerable quantity of iliac crest bone.  This was  excellent cancellous  material, and we were able to use the trap door technique, folding back down  the trap door made with osteotomes with the hinge being medial and  posterior.  The soft tissues were repaired in layers after temporary  placement of a Gelfoam.  The 0 Vicryl, 2-0 Vicryl, and a running  subcuticular stitch were then used, and then Steri-Strips applied.  Attention was turned distally where we then made the old incision to the  knee, identified the tip of the antibiotic rod.  We also reentered along the  edge of the muscle flap and elevated this medially.  No gross purulence was  encountered; however, some fibrous tissue had reaccumulation.  Once again,  the bone ends were scraped thoroughly, and attention was directed toward  this full thickness defect in the flap, which continued to be a problem.  It  tracked directly down to the fracture site in the area in which we needed  to  place the iliac crest graft.  Consequently, the decision was made to proceed  with excision full thickness and then apposition with sutures.  We debrided  the skin and subcutaneous tissue and muscle all along this necrotic area  after scraping the margins with a curette.  We then used buried PDS sutures  and then nylon far-near, near-far sutures to reduce tension as we closed  this area.  We then took a bur to freshen the sclerotic bone ends as well as  a drill to get back to bleeding cancellous and cortical bone.  We did take  the bur posteriorly along the distal aspect of the tibia to bur down  posterior spikes so that we could achieve better apposition.  The antibiotic  rod was grasped with a Kocher and thrust proximally and then retrieved  through the knee.  We continued the burring process and the curettage  process and so forth.  We then inserted a guide rod and began sequentially  reaming, going up to 12.5.  This removed all the adherent fibrous tissue  within the canal, but did encounter a moderate amount of resistance.  Because we did not want to overream him and jeopardize his blood supply or  burn the bone in the intramedullary canal, we elected to proceed with  placement of an 11 mm intramedullary nail.  The nail was then inserted and  locked distally.  We chose the same size nail as was previously used because  this nail had been left slightly short and we knew that we would compress at  least a centimeter at the fracture site.  We locked it distally using the  perfect circle technique and then gently back slapped it to obtain excellent  apposition.  Prior to compressing at the fracture site, we did place some of  the cancellous iliac crest bone graft posteriorly around the fracture site.  Once the tibial cortex was apposed, we then locked the nail proximally, both  through a dynamic sliding hole as well as a static hole.  We wanted to minimize motion at the fracture site  during the initial stages of his  healing while providing for the possibility of dynamization at the three  month mark or so if needed.  Prior to back slapping the nail, we did  approach the fibula through a 3 cm lateral incision being visual and to  avoid injury to the peroneal nerve or vessel.  We used a large enough  incision to make certain that we did not  encounter this nerve and that we  had adequate exposure, a centimeter of bone was about 12 mm rather than bone  work size through this osteotomy.  We then took the remainder of the iliac  crest graft, which again was significant in quantity, and completely packed  it around and within the fracture site where possible.  We were able to  place a graft nearly circumferentially.  We then closed the area with a deep  PDS layer and then used 2-0 Vicryl and nylon for the area along the flap.  We used a Vicryl and staples at the knee in the locking bolt sites.  Sterile  dressing was applied, the patient awakened from anesthesia after application  of a posterior U-dressing, and transported to the PACU in stable condition.   PROGNOSIS:  Mr. Patsey Berthold prognosis is good as we were able to achieve a solid  bony apposition, we did not encounter any gross purulence, and we had an  excellent graft from his crest.  However, he certainly is at high risk for  persistent nonunion given the atrophic nature and prior infection in this  area.  Because he has been so active, we will place him on DVT prophylaxis  in the hospital only and then will allow him simply to continue with his  very active lifestyle.  He will ambulatory on postop day one if patterns  hold.  He will be touchdown weightbearing only on this side until he begins  to develop some evidence of healing, probably at the six-week mark, and then  we will rapidly progress weightbearing.  We will consider dynamization at  the three-month mark, and at this point he should develop enough stability   that, rather than bend at the fracture site with removal of the static nail,  he will only truly achieve axial compression.  He will remain on IV  Vancomycin, and fortunately he has tolerated this well without systemic  complication.      [place]   MHH/MEDQ  D:  05/13/2004  T:  05/13/2004  Job:  [id number]

## 2010-09-17 NOTE — Op Note (Signed)
NAME:  Theodore Obrien, Theodore Obrien NO.:  [id number]   MEDICAL RECORD NO.:  [id number]          PATIENT TYPE:  OIB   LOCATION:  2550                         FACILITY:  [facility]   PHYSICIAN:  Doralee Albino. Carola Frost, M.D. DATE OF BIRTH:  1951/04/12   DATE OF PROCEDURE:  08/27/2004  DATE OF DISCHARGE:  08/27/2004                                 OPERATIVE REPORT   PREOPERATIVE DIAGNOSIS:  Persistent right tibial nonunion.   POSTOPERATIVE DIAGNOSIS:  Persistent right tibial nonunion.   PROCEDURE:  1.  Right tibial exchange nailing and compression, using Howmedica T2 12mm X       2.  Fibular osteotomy.   SURGEON:  Doralee Albino. Carola Frost, M.D.   ASSISTANT:  Cecil Cranker, P.A.-C.   ANESTHESIA:  General.   COMPLICATIONS:  None.   DRAINS:  None.   SPECIMENS:  Two sent to microbiology for culture.   DISPOSITION:  To the PACU.   CONDITION:  Stable.   INDICATIONS FOR PROCEDURE:  Mr. Towry is a 60 year old well known to my  service for treatment of his persistent right tibial nonunion which was  treated back in January with removal of an antibiotic rod, fibular  osteotomy, and intramedullary nailing using iliac crest bone graft at the  site.  He has been followed for the last three months over which he has  begun weight-bearing as tolerated without complications other than mild  discomfort.  However, his x-rays demonstrated failure of his tibial nonunion  site to completely heal.  He has maintained IV antibiotics throughout this  course.  After discussing the risks and benefits, the patient wished to  proceed with exchange nailing of his tibia, repeat fibular osteotomy, and  use of a compression type nail device.   BRIEF SUMMARY OF PROCEDURE:  Mr. Milnes went to the operating room where his  11 by 345 mm nail was removed.  The tibia was then sequentially reamed up to  13.5.  A 12 mm nail was then inserted and locked distally.  A single  proximal lock was placed in the  Dynamic slot, as well.  The fibula was then  exposed laterally and a repeat 1 cm osteotomy was performed.  We did  separate the fibula from the tibia to allow for dynamization of the distal  tibia proximally.  The compression screw was then placed into the center of  the intramedullary nail and cranked until the Dynamic proximal screw was  engaged.  We then continued to apply compression resulting in bending both  the proximal and distal locks to maximize compression at the nonunion site.  This was watched under direct fluoroscopic imaging on the lateral and noted  to result in compression of the fracture site with generation of a solid  apposition and stability.  The wounds were then copiously irrigated and  closed in standard layered fashion.  The knee was washed, as well, and given  that a small incision was made, an 18 gauge needle was also used to extract  any additional collection within the knee joint, itself.  Sterile, gently  compressive dressing was  applied without a splint.  The patient was then  taken to the PACU in stable condition.  Reamings were sent to microbiology  during the procedure for culture and gram stain.   PROGNOSIS:  Mr. Glomski's prognosis with this fracture is fair.  It has  continued to manifest as a persistent nonunion despite aggressive treatment,  both of his infection as well as his fracture site.  We have applied  significant compressive forces to his fracture site and am hopeful that with  exchange nailing, this can ultimately lead to union.  It certainly should be  stable and he be allowed to weight-bear as tolerated and given his tolerance  of such procedures in the past, he will be discharged to home from the PACU  per his request.  Should he have any pain which is not well controlled, we  will be more than happy to admit him overnight or for two days.  It may be  that he requires additional osteotomy at some point proximal to this  nonunion site in  order to maximize compression or that he may need a second  iliac crest grafting, but we are certainly hopeful to avoid that as well as  possible circular frame should this not be successful.      MHH/MEDQ  D:  09/06/2004  T:  09/06/2004  Job:  [id number]
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
## 2010-09-17 NOTE — Assessment & Plan Note (Signed)
[name] returns to the clinic today for follow-up evaluation.  He reports  that he is getting good results overall with the OxyContin and oxycodone  noted below.  He reports that he has some increased pain, especially in his  foot on the right side if he is up for a long period of time at work.  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   MEDICATIONS:  1.  OxyContin 40 mg twice a day.  2.  Oxycodone 5 mg one to two tablets p.o. q.4-6h. p.r.n. (approximately      five per day).  3.  Cymbalta 60 mg daily.  4.  Gabapentin 600 mg t.i.d.   REVIEW OF SYSTEMS:  Positive for urinary retention and coughing along with  sinus problems.   PHYSICAL EXAMINATION:  GENERAL:  Well-appearing, fit adult male in mild to  no acute discomfort.  VITAL SIGNS:  Blood pressure 140/89 with a pulse of 107, respiratory rate  16, and O2 saturation 96% on room air.  NEUROLOGIC:  He has 5-/5 strength throughout the bilateral upper and lower  extremities.  Bulk and tone were normal.   IMPRESSION:  Persistent severe pain of the right foot along with numbness of  the bilateral legs up to the level of the knees.   In the office we refilled the patient's OxyContin medication 40 mg q.12h.  and oxycodone at 5 mg one to two tablets p.o. q.4-6h. p.r.n.  Those were  both written for August 13, 2005.  We will plan on seeing him in follow-up in  approximately two to three months' time.           ______________________________  Ellwood Dense, M.D.     DC/MedQ  D:  08/11/2005 10:10:20  T:  08/11/2005 11:31:58  Job #:  [id number]

## 2010-09-17 NOTE — Assessment & Plan Note (Signed)
Theodore Obrien returns to clinic today for followup evaluation.  He reports that  he is doing fairly well overall.  He has good days and bad days, but gets  reasonable relief from the combination of his oxycodone immediate release  and oxycodone sustained release.  He is using those as noted below and needs  a refill over the next few days.  He actually has approximately 41 tablets  of the oxycodone immediate release remaining and 15 tablets of the sustained  release, and that is dated from a prescription January 26, 2006.  He does  not use it quite as much as allowed and still gets reasonable relief.  He  does not need a refill on the Neurontin in the office today.   He has had some adjustment in his work schedule and is doing some extra  walking at his job that causes him some increased foot pain.   The patient has been diagnosed with mild carpal tunnel syndrome by Dr.  Thad Ranger on nerve conduction studies.  He is being referred to Dr. Carola Frost for  evaluation of carpal tunnel surgery.  Dr. Carola Frost apparently did some prior  right leg surgery for this gentleman.   MEDICATIONS:  1. Oxycodone extended release 40 mg q. 12 hours.  2. Oxycodone immediate release 5 mg 1 to 2 tablets p.o. t.i.d. p.r.n.  3. Cymbalta 60 mg.  4. Neurontin 600 mg q.i.d.   REVIEW OF SYSTEMS:  Positive for nausea and urinary retention.   PHYSICAL EXAM:  Well-appearing thin adult male in mild to no acute  discomfort.  Blood pressure 170/90, with a pulse of 116, respiratory rate 16, and an O2  saturation 94% on room air.  He has 5/5 strength to his bilateral upper and lower extremities.  Bulk and  tone were normal.  He had decreased sensation of the right foot along the  medial aspect.   IMPRESSION:  Persistent severe pain of the right foot along with numbness of  the feet bilaterally through knee level.   In the office today we did refill the patient's oxycodone sustained release,  along with his immediate release,  each as of March 02, 2006.  He does not  need a refill of his Neurontin in the office today.  We will plan on seeing  the patient in followup in this office in approximately 2 to 3 months' time  with refills prior to that appointment as necessary.           ______________________________  Ellwood Dense, M.D.     DC/MedQ  D:  02/22/2006 15:10:17  T:  02/23/2006 15:38:53  Job #:  [id number]
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
## 2010-09-17 NOTE — Assessment & Plan Note (Signed)
MEDICAL RECORD NUMBER:  [id number]   [name] returns to the clinic today for follow-up evaluation. He reports  that he is getting good relief from a combination of his oxycodone used four  to six tablets per day and his OxyContin used 40 mg twice a day. He does  need refills on both of those in the office today. He reports that the pain  never goes away but the medicines do make his pain more tolerable. He still  has pain at the balls of his feet which he complains is a bruised-like  feeling, especially after he is up walking for a prolonged period of time.  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 He has placed gel  pads in his shoes and that has helped with his foot pain.   MEDICATIONS:  1.  OxyContin 40 mg twice a day.  2.  Oxycodone 5 mg one to two tablets p.o. q.4-6h. p.r.n.  3.  Cymbalta 60 mg daily.  4.  Gabapentin 600 mg t.i.d.   REVIEW OF SYSTEMS:  Noncontributory.   PHYSICAL EXAMINATION:  Well-appearing, fit adult male in mild to no acute  discomfort. Blood pressure 156/91 with a pulse of 87, respiratory rate 16,  and O2 saturation 95% on room air. He has 5/5 strength throughout the  bilateral upper extremities and 5-/5 strength throughout the bilateral lower  extremities. The patient has intact sensation in his upper extremities with  the exception of his right hand fingertips. He has slightly decreased  sensation below knee levels bilaterally.   IMPRESSION:  Persistent severe pain of the right foot along with numbness of  the bilateral legs up to the level of his knees.   In the office today we did refill the patient's OxyContin along with his  oxycodone medication as of February 19 and February 16 respectively. We will  plan on seeing him in follow-up in approximately 2 months' time with refill  of medication prior to that as needed at the 27-month period.           ______________________________  Ellwood Dense, M.D.     DC/MedQ  D:   06/16/2005 10:13:53  T:  06/16/2005 10:50:41  Job #:  [id number]

## 2011-02-13 ENCOUNTER — Emergency Department: Payer: Self-pay | Admitting: Emergency Medicine

## 2012-04-09 ENCOUNTER — Ambulatory Visit: Payer: Self-pay

## 2012-09-28 ENCOUNTER — Ambulatory Visit: Payer: Self-pay | Admitting: Internal Medicine

## 2014-10-06 ENCOUNTER — Telehealth: Payer: Self-pay | Admitting: Surgery
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
## 2014-10-07 NOTE — Telephone Encounter (Signed)
I have called pt back and advised him of sx date and pre op date and time.

## 2014-10-20 ENCOUNTER — Encounter
Admission: RE | Admit: 2014-10-20 | Discharge: 2014-10-20 | Disposition: A | Discharge status: Home / Self Care | Payer: Medicare Other | Source: Ambulatory Visit | Attending: Surgery | Admitting: Surgery

## 2014-10-20 DIAGNOSIS — Z01812 Encounter for preprocedural laboratory examination: Secondary | ICD-10-CM | POA: Diagnosis present

## 2014-10-20 DIAGNOSIS — I10 Essential (primary) hypertension: Secondary | ICD-10-CM | POA: Diagnosis not present

## 2014-10-20 DIAGNOSIS — K4190 Unilateral femoral hernia, without obstruction or gangrene, not specified as recurrent: Secondary | ICD-10-CM | POA: Insufficient documentation

## 2014-10-20 DIAGNOSIS — Z01810 Encounter for preprocedural cardiovascular examination: Secondary | ICD-10-CM | POA: Insufficient documentation

## 2014-10-20 HISTORY — DX: Essential (primary) hypertension: I10

## 2014-10-20 HISTORY — DX: Gastro-esophageal reflux disease without esophagitis: K21.9

## 2014-10-20 LAB — CBC WITH DIFFERENTIAL/PLATELET
Basophils Absolute: 0 10*3/uL (ref 0–0.1)
Basophils Relative: 1 %
Eosinophils Absolute: 0.4 10*3/uL (ref 0–0.7)
Eosinophils Relative: 7 %
HCT: 40.5 % (ref 40.0–52.0)
Hemoglobin: 13.5 g/dL (ref 13.0–18.0)
Lymphocytes Relative: 23 %
Lymphs Abs: 1.3 10*3/uL (ref 1.0–3.6)
MCH: 28.5 pg (ref 26.0–34.0)
MCHC: 33.3 g/dL (ref 32.0–36.0)
MCV: 85.6 fL (ref 80.0–100.0)
Monocytes Absolute: 0.6 10*3/uL (ref 0.2–1.0)
Monocytes Relative: 10 %
Neutro Abs: 3.3 10*3/uL (ref 1.4–6.5)
Neutrophils Relative %: 59 %
Platelets: 165 10*3/uL (ref 150–440)
RBC: 4.73 MIL/uL (ref 4.40–5.90)
RDW: 13.7 % (ref 11.5–14.5)
WBC: 5.5 10*3/uL (ref 3.8–10.6)

## 2014-10-20 LAB — BASIC METABOLIC PANEL
Anion gap: 4 — ABNORMAL LOW (ref 5–15)
BUN: 11 mg/dL (ref 6–20)
CO2: 28 mmol/L (ref 22–32)
Calcium: 8.6 mg/dL — ABNORMAL LOW (ref 8.9–10.3)
Chloride: 104 mmol/L (ref 101–111)
Creatinine, Ser: 0.85 mg/dL (ref 0.61–1.24)
GFR calc Af Amer: >60 mL/min (ref >60)
GFR calc non Af Amer: >60 mL/min (ref >60)
Glucose, Bld: 126 mg/dL — ABNORMAL HIGH (ref 65–99)
Potassium: 4.6 mmol/L (ref 3.5–5.1)
Sodium: 136 mmol/L (ref 135–145)

## 2014-10-20 NOTE — Patient Instructions (Signed)
  Your procedure is scheduled on: Monday 6/27 Report to Day Surgery. [facility] mall Entrance To find out your arrival time please call [phone] between 1PM - 3PM on Friday 6/24.  Remember: Instructions that are not followed completely may result in serious medical risk, up to and including death, or upon the discretion of your surgeon and anesthesiologist your surgery may need to be rescheduled.    __x__ 1. Do not eat food or drink liquids after midnight. No gum chewing or hard candies.     __x__ 2. No Alcohol for 24 hours before or after surgery.   ____ 3. Bring all medications with you on the day of surgery if instructed.    __x__ 4. Notify your doctor if there is any change in your medical condition     (cold, fever, infections).     Do not wear jewelry, make-up, hairpins, clips or nail polish.  Do not wear lotions, powders, or perfumes.   Do not shave 48 hours prior to surgery. Men may shave face and neck.  Do not bring valuables to the hospital.    [facility] is not responsible for any belongings or valuables.               Contacts, dentures or bridgework may not be worn into surgery.  Leave your suitcase in the car. After surgery it may be brought to your room.  For patients admitted to the hospital, discharge time is determined by your                treatment team.   Patients discharged the day of surgery will not be allowed to drive home.   Please read over the following fact sheets that you were given:   Surgical Site Infection Prevention   __x__ Take these medicines the morning of surgery with A SIP OF WATER:    1. carvedilol  2. clonidine  3.   4.  5.  6.  ____ Fleet Enema (as directed)   __x_ Use CHG Soap as directed  ____ Use inhalers on the day of surgery  ____ Stop metformin 2 days prior to surgery    ____ Take 1/2 of usual insulin dose the night before surgery and none on the morning of surgery.   ____ Stop Coumadin/Plavix/aspirin on   ____  Stop Anti-inflammatories on    ____ Stop supplements until after surgery.    ____ Bring C-Pap to the hospital.

## 2014-10-27 ENCOUNTER — Ambulatory Visit: Payer: Medicare Other | Admitting: Anesthesiology

## 2014-10-27 ENCOUNTER — Encounter
Admission: RE | Disposition: A | Discharge status: Home / Self Care | Payer: Self-pay | Source: Ambulatory Visit | Attending: Surgery

## 2014-10-27 ENCOUNTER — Ambulatory Visit
Admission: RE | Admit: 2014-10-27 | Discharge: 2014-10-27 | Disposition: A | Discharge status: Home / Self Care | Payer: Medicare Other | Source: Ambulatory Visit | Attending: Surgery | Admitting: Surgery

## 2014-10-27 ENCOUNTER — Encounter: Payer: Self-pay | Admitting: *Deleted

## 2014-10-27 DIAGNOSIS — I10 Essential (primary) hypertension: Secondary | ICD-10-CM | POA: Insufficient documentation

## 2014-10-27 DIAGNOSIS — K4190 Unilateral femoral hernia, without obstruction or gangrene, not specified as recurrent: Secondary | ICD-10-CM

## 2014-10-27 DIAGNOSIS — Z79899 Other long term (current) drug therapy: Secondary | ICD-10-CM | POA: Insufficient documentation

## 2014-10-27 DIAGNOSIS — K4130 Unilateral femoral hernia, with obstruction, without gangrene, not specified as recurrent: Secondary | ICD-10-CM | POA: Insufficient documentation

## 2014-10-27 DIAGNOSIS — F17210 Nicotine dependence, cigarettes, uncomplicated: Secondary | ICD-10-CM | POA: Insufficient documentation

## 2014-10-27 HISTORY — PX: FEMORAL HERNIA REPAIR: SHX632

## 2014-10-27 SURGERY — HERNIA REPAIR FEMORAL
Anesthesia: General | Site: Groin | Laterality: Left | Wound class: Clean

## 2014-10-27 MED ORDER — NEOMYCIN-POLYMYXIN B GU 40-200000 IR SOLN
Status: DC | PRN
  Administered 2014-10-27: 2 mL

## 2014-10-27 MED ORDER — PROPOFOL 10 MG/ML IV BOLUS
INTRAVENOUS | Status: DC | PRN
  Administered 2014-10-27: 30 mg via INTRAVENOUS
  Administered 2014-10-27: 150 mg via INTRAVENOUS

## 2014-10-27 MED ORDER — CEFAZOLIN SODIUM 1-5 GM-% IV SOLN
1.0000 g | INTRAVENOUS | Status: AC
Start: 2014-10-27 — End: 2014-10-27
  Administered 2014-10-27: 1 g via INTRAVENOUS

## 2014-10-27 MED ORDER — MIDAZOLAM HCL 2 MG/2ML IJ SOLN
INTRAMUSCULAR | Status: DC | PRN
  Administered 2014-10-27: 1 mg via INTRAVENOUS
  Administered 2014-10-27: .5 mg via INTRAVENOUS

## 2014-10-27 MED ORDER — KETOROLAC TROMETHAMINE 30 MG/ML IJ SOLN
INTRAMUSCULAR | Status: DC | PRN
  Administered 2014-10-27: 30 mg via INTRAVENOUS

## 2014-10-27 MED ORDER — FAMOTIDINE 20 MG PO TABS
20.0000 mg | ORAL_TABLET | Freq: Once | ORAL | Status: AC
  Administered 2014-10-27: 20 mg via ORAL

## 2014-10-27 MED ORDER — FAMOTIDINE 20 MG PO TABS
ORAL_TABLET | ORAL | Status: AC
  Administered 2014-10-27: 20 mg via ORAL
  Filled 2014-10-27: qty 1

## 2014-10-27 MED ORDER — NEOMYCIN-POLYMYXIN B GU 40-200000 IR SOLN
Status: AC
  Filled 2014-10-27: qty 2

## 2014-10-27 MED ORDER — ONDANSETRON HCL 4 MG/2ML IJ SOLN: 4.0000 mg | Freq: Once | INTRAMUSCULAR | Status: DC | PRN

## 2014-10-27 MED ORDER — HEPARIN SODIUM (PORCINE) 5000 UNIT/ML IJ SOLN
5000.0000 [IU] | Freq: Once | INTRAMUSCULAR | Status: AC
  Administered 2014-10-27: 5000 [IU] via SUBCUTANEOUS

## 2014-10-27 MED ORDER — BUPIVACAINE HCL (PF) 0.25 % IJ SOLN
INTRAMUSCULAR | Status: AC
  Filled 2014-10-27: qty 30

## 2014-10-27 MED ORDER — LACTATED RINGERS IV SOLN
INTRAVENOUS | Status: DC
  Administered 2014-10-27 (×2): via INTRAVENOUS

## 2014-10-27 MED ORDER — HEPARIN SODIUM (PORCINE) 5000 UNIT/ML IJ SOLN
INTRAMUSCULAR | Status: AC
  Administered 2014-10-27: 5000 [IU] via SUBCUTANEOUS
  Filled 2014-10-27: qty 1

## 2014-10-27 MED ORDER — FENTANYL CITRATE (PF) 100 MCG/2ML IJ SOLN
INTRAMUSCULAR | Status: DC | PRN
  Administered 2014-10-27 (×2): 25 ug via INTRAVENOUS
  Administered 2014-10-27: 50 ug via INTRAVENOUS

## 2014-10-27 MED ORDER — FENTANYL CITRATE (PF) 100 MCG/2ML IJ SOLN
25.0000 ug | INTRAMUSCULAR | Status: DC | PRN
  Administered 2014-10-27 (×4): 25 ug via INTRAVENOUS

## 2014-10-27 MED ORDER — HYDROCODONE-ACETAMINOPHEN 5-325 MG PO TABS: 1.0000 | ORAL_TABLET | Freq: Four times a day (QID) | ORAL | Status: DC | PRN

## 2014-10-27 MED ORDER — HYDROCODONE-ACETAMINOPHEN 5-325 MG PO TABS
1.0000 | ORAL_TABLET | Freq: Four times a day (QID) | ORAL | Status: DC | PRN
  Administered 2014-10-27: 2 via ORAL

## 2014-10-27 MED ORDER — FENTANYL CITRATE (PF) 100 MCG/2ML IJ SOLN
INTRAMUSCULAR | Status: AC
  Administered 2014-10-27: 25 ug via INTRAVENOUS
  Filled 2014-10-27: qty 2

## 2014-10-27 MED ORDER — HYDROCODONE-ACETAMINOPHEN 5-325 MG PO TABS
ORAL_TABLET | ORAL | Status: AC
  Filled 2014-10-27: qty 2

## 2014-10-27 MED ORDER — CEFAZOLIN SODIUM 1-5 GM-% IV SOLN
INTRAVENOUS | Status: AC
  Administered 2014-10-27: 1 g via INTRAVENOUS
  Filled 2014-10-27: qty 50

## 2014-10-27 MED ORDER — LIDOCAINE HCL (CARDIAC) 20 MG/ML IV SOLN
INTRAVENOUS | Status: DC | PRN
  Administered 2014-10-27: 30 mg via INTRAVENOUS

## 2014-10-27 MED ORDER — EPHEDRINE SULFATE 50 MG/ML IJ SOLN
INTRAMUSCULAR | Status: DC | PRN
  Administered 2014-10-27: 10 mg via INTRAVENOUS

## 2014-10-27 MED ORDER — BUPIVACAINE HCL (PF) 0.25 % IJ SOLN
INTRAMUSCULAR | Status: DC | PRN
  Administered 2014-10-27: 20 mL via PERINEURAL

## 2014-10-27 SURGICAL SUPPLY — 39 items
APL SKNCLS STERI-STRIP NONHPOA (GAUZE/BANDAGES/DRESSINGS) ×2
BENZOIN TINCTURE PRP APPL 2/3 (GAUZE/BANDAGES/DRESSINGS) ×3 IMPLANT
CANISTER SUCT 1200ML W/VALVE (MISCELLANEOUS) ×4 IMPLANT
CHLORAPREP W/TINT 26ML (MISCELLANEOUS) ×4 IMPLANT
CLOSURE WOUND 1/2 X4 (GAUZE/BANDAGES/DRESSINGS) ×1
DRAIN PENROSE 5/8X12 LTX STRL (DRAIN) ×4 IMPLANT
DRAPE INCISE IOBAN 66X45 STRL (DRAPES) ×1 IMPLANT
DRAPE LAPAROTOMY TRNSV 106X77 (MISCELLANEOUS) ×4 IMPLANT
DRAPE SHEET LG 3/4 BI-LAMINATE (DRAPES) ×4 IMPLANT
DRAPE UTILITY 15X26 TOWEL STRL (DRAPES) ×8 IMPLANT
DRESSING TELFA 4X3 1S ST N-ADH (GAUZE/BANDAGES/DRESSINGS) ×4 IMPLANT
DRSG TEGADERM 2-3/8X2-3/4 SM (GAUZE/BANDAGES/DRESSINGS) ×3 IMPLANT
DRSG TEGADERM 4X4.75 (GAUZE/BANDAGES/DRESSINGS) ×1 IMPLANT
ELECT CAUTERY BLADE 6.4 (BLADE) ×4 IMPLANT
GAUZE SPONGE 4X4 12PLY STRL (GAUZE/BANDAGES/DRESSINGS) ×1 IMPLANT
GLOVE BIO SURGEON STRL SZ7.5 (GLOVE) ×10 IMPLANT
GOWN STRL REUS W/ TWL LRG LVL3 (GOWN DISPOSABLE) ×2 IMPLANT
GOWN STRL REUS W/ TWL XL LVL3 (GOWN DISPOSABLE) ×2 IMPLANT
GOWN STRL REUS W/TWL LRG LVL3 (GOWN DISPOSABLE) ×4
GOWN STRL REUS W/TWL XL LVL3 (GOWN DISPOSABLE) ×4
LABEL OR SOLS (LABEL) ×4 IMPLANT
MESH PATCH KUGEL HERNIA (Mesh General) ×3 IMPLANT
NDL SAFETY 25GX1.5 (NEEDLE) ×4 IMPLANT
NS IRRIG 500ML POUR BTL (IV SOLUTION) ×4 IMPLANT
PACK BASIN MINOR ARMC (MISCELLANEOUS) ×4 IMPLANT
PAD GROUND ADULT SPLIT (MISCELLANEOUS) ×4 IMPLANT
STAPLER SKIN PROX 35W (STAPLE) ×1 IMPLANT
STRAP SAFETY BODY (MISCELLANEOUS) ×1 IMPLANT
STRIP CLOSURE SKIN 1/2X4 (GAUZE/BANDAGES/DRESSINGS) ×2 IMPLANT
SUT SILK 2 0 SH (SUTURE) ×2 IMPLANT
SUT VIC AB 0 UR5 27 (SUTURE) ×10 IMPLANT
SUT VIC AB 2-0 BRD 54 (SUTURE) ×1 IMPLANT
SUT VIC AB 2-0 SH 27 (SUTURE) ×4
SUT VIC AB 2-0 SH 27XBRD (SUTURE) ×5 IMPLANT
SUT VIC AB 3-0 SH 27 (SUTURE) ×4
SUT VIC AB 3-0 SH 27X BRD (SUTURE) ×2 IMPLANT
SUT VIC AB 4-0 FS2 27 (SUTURE) ×4 IMPLANT
SWABSTK COMLB BENZOIN TINCTURE (MISCELLANEOUS) ×1 IMPLANT
SYRINGE 10CC LL (SYRINGE) ×4 IMPLANT

## 2014-10-27 NOTE — Transfer of Care (Signed)
Immediate Anesthesia Transfer of Care Note  Patient: Theodore Obrien  Procedure(s) Performed: Procedure(s): Repair of left femoral and direct hernia with mesh  (Left)  Patient Location: PACU  Anesthesia Type:General  Level of Consciousness: sedated  Airway & Oxygen Therapy: Patient Spontanous Breathing and Patient connected to face mask oxygen  Post-op Assessment: Report given to RN and Post -op Vital signs reviewed and stable  Post vital signs: Reviewed and stable  Last Vitals:  Filed Vitals:   10/27/14 0844  BP: 141/79  Pulse: 53  Temp: 37 C  Resp: 12    Complications: No apparent anesthesia complications

## 2014-10-27 NOTE — Op Note (Signed)
10/27/2014  8:35 AM  PATIENT:  Theodore Obrien  64 y.o. male  PRE-OPERATIVE DIAGNOSIS:  FEMORAL HERNIA  POST-OPERATIVE DIAGNOSIS:  left femoral and direct hernia   PROCEDURE:  Procedure(s): Repair of left femoral and direct hernia with mesh  (Left)  SURGEON:  Surgeon(s) and Role:    Sherri Rad, MD - Primary  ASSISTANTS: Scrub tech  ANESTHESIA: General  SPECIMEN:none  Findings:  Small direct and moderate femoral hernia  EBL: Minimal  Description of procedure:   With informed consent supine position general anesthesia the patient's left groin was sterilely prepped and draped with Betadine solution. [name] was applied. Timeout was observed.  Incision was fashion low in the left groin. Scalpel was used on skin and subcutaneous tissues and Scarpa's fascia divided with electrocautery. The course of the external oblique aponeurosis was identified and incised laterally with scalpel and carried through the external ring with Metzenbaum scissors. The spermatic cord was then encircled at the pubic tubercle and elevated with Penrose drain. Dissection of the cord demonstrated the peritoneal reflection. There was a moderate direct inguinal hernia.  The floor the inguinal canal was opened with electrocautery through the transversalis muscle and the preperitoneal space was identified. Cooper's ligament was swept inferiorly and laterally. ED up to the vessels was an incarcerated femoral hernia which was reduced easily returned back into the preperitoneal space.  Small oval Kugel patch was brought into the field and secured to the Cooper's ligament with a number 000 Vicryls suture and placed in the preperitoneal pocket to obliterate the direct femoral and indirect hernia spaces. Mesh lay without any undue tension or twisting. It was secured superiorly and inferiorly along the undersurface of the transversalis fascia with number 00 Vicryls suture. The floor of the inguinal canal was then reapproximated  with 00 Vicryls suture in running locking fashion. External oblique was reapproximated from lateral to medial with 2-0 Vicryls suture. Scarpa's fascia was reapproximated with 20- Vicryls suture. 3-0 running deep dermal was placed followed by 4-0 Vicryls septic or in the skin. Benzoin Steri-Strips Telfa and Tegaderm and then applied. She was then taken to the recovery room in stable and satisfactory condition by anesthesia services.

## 2014-10-27 NOTE — Anesthesia Procedure Notes (Signed)
Procedure Name: LMA Insertion Date/Time: 10/27/2014 7:45 AM Performed by: Allean Found Pre-anesthesia Checklist: Patient identified, Emergency Drugs available, Suction available, Patient being monitored and Timeout performed Patient Re-evaluated:Patient Re-evaluated prior to inductionOxygen Delivery Method: Circle system utilized Preoxygenation: Pre-oxygenation with 100% oxygen Intubation Type: IV induction Ventilation: Mask ventilation without difficulty LMA: LMA inserted Tube size: 4.0 mm Number of attempts: 1 Placement Confirmation: positive ETCO2 and breath sounds checked- equal and bilateral Tube secured with: Tape Dental Injury: Teeth and Oropharynx as per pre-operative assessment
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
## 2014-10-27 NOTE — Anesthesia Postprocedure Evaluation (Signed)
  Anesthesia Post-op Note  Patient: Theodore Obrien  Procedure(s) Performed: Procedure(s): Repair of left femoral and direct hernia with mesh  (Left)  Anesthesia type:General  Patient location: PACU  Post pain: Pain level controlled  Post assessment: Post-op Vital signs reviewed, Patient's Cardiovascular Status Stable, Respiratory Function Stable, Patent Airway and No signs of Nausea or vomiting  Post vital signs: Reviewed and stable  Last Vitals:  Filed Vitals:   10/27/14 1049  BP: 109/67  Pulse: 59  Temp: 36.2 C  Resp: 14    Level of consciousness: awake, alert  and patient cooperative  Complications: No apparent anesthesia complications

## 2014-10-27 NOTE — H&P (Signed)
  Date of Initial H&P:09/11/2014  History reviewed, patient examined, no change in status, stable for surgery. 

## 2014-10-27 NOTE — Discharge Instructions (Addendum)
DISCHARGE INSTRUCTIONS TO PATIENT  REMINDER:   Carry a list of your medications and allergies with you at all times  Call your pharmacy at least 1 week in advance to refill prescriptions  Do not mix any prescribed pain medicine with alcohol  Do not drive any motor vehicles while taking pain medication.  Take medications with food.  Do not retake a pain medication if you vomit after taking it.  Activity: no lifting more than 15 pounds until instructed by your doctor.   Dressing Care Instruction (if applicable):              Remove operative dressings in 48 hours.  May Shower-  Call office if any questions regarding this activity.  Dry Dressing as needed to operative site.  Drain care instructions provided to you in the hospital.   Follow-up appointments (date to return to physician): Call for appointment with Dr. Sherri Rad, MD at [phone] or [phone]  If need MD on call after hours and on weekends call Hospital operator at [phone] as ask to speak to Surgeon on call for [facility].  Call Surgeon if you have:  Temperature greater than 100.4  Persistent nausea and vomiting  Severe uncontrolled pain  Redness, tenderness, or signs of infection (pain, swelling, redness, odor or green/yellow discharge around the site)  Difficulty breathing, headache or visual disturbances  Hives  Persistent dizziness or light-headedness  Extreme fatigue  Any other questions or concerns you may have after discharge  In an emergency, call 911 or go to an Emergency Department at a nearby hospital  Diet:  Resume your usual diet.  Avoid spicy, greasy or heavy foods.  If you have nausea or vomiting, go back to liquids.  If you cannot keep liquids down, call your doctor.  Avoid alcohol consumption while on prescription pain medications. Good nutrition promotes healing. Increase fiber and fluids.     I understand and acknowledge receipt of the above instructions.                                                                                                                                         Patient or Guardian Signature                                                                    Date/Time  Physician's or R.N.'s Signature                                                                  Date/Time  The discharge instructions have been reviewed with the patient and/or Family Member/Parent/Guardian.  Patient and/or Family Member/Parent/Guardian signed and retained a printed copy.

## 2014-10-28 ENCOUNTER — Encounter: Payer: Self-pay | Admitting: Surgery

## 2014-10-29 ENCOUNTER — Telehealth: Payer: Self-pay | Admitting: Surgery
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
## 2014-10-29 NOTE — Telephone Encounter (Signed)
Returned patient call. Patient reports that the swelling has gone down since yesterday and that his penis and scrotum are bruised. Patient did not report any additional symptoms such as severe pain or fever. I informed patient that some bruising and swelling are normal 3 days after a hernia surgery. Patient directed to go to ED if he develops a fever; if swelling and bruising becomes worse, or if he experiences sudden un relievable severe pain. Patient confirmed understanding of directions.

## 2014-11-04 ENCOUNTER — Ambulatory Visit (INDEPENDENT_AMBULATORY_CARE_PROVIDER_SITE_OTHER): Payer: Medicare Other | Admitting: Surgery

## 2014-11-04 ENCOUNTER — Encounter: Payer: Self-pay | Admitting: Surgery

## 2014-11-04 VITALS — BP 136/76 | HR 61 | Temp 98.9°F | Resp 20

## 2014-11-04 DIAGNOSIS — K4090 Unilateral inguinal hernia, without obstruction or gangrene, not specified as recurrent: Secondary | ICD-10-CM

## 2014-11-04 NOTE — Progress Notes (Signed)
Outpatient postop visit  11/04/2014  Theodore Obrien is an 64 y.o. male.    Procedure: Left femoral hernia repair  CC: Minimal pain  HPI: October performed a left femoral hernia repair with mesh via an open technique. Patient has no complaints at this time with the exception of minimal pain and edge.  Medications reviewed.    Physical Exam:  BP 136/76 mmHg  Pulse 61  Temp(Src) 98.9 F (37.2 C) (Oral)  Resp 20    PE: Healing left groin scar no erythema no drainage minimal induration nontender    Assessment/Plan:  Patient doing very well postop open femoral hernia repair on the left with mesh. I reminded him of no heavy lifting or sports for another 2 weeks. He will call and follow-up if needed.  Florene Glen, MD, FACS

## 2014-11-24 ENCOUNTER — Telehealth: Payer: Self-pay

## 2014-11-24 NOTE — Telephone Encounter (Signed)
Patient called stating that he had surgery (hernia repair) on 10/27/2014-Dr. Marina Gravel. He stated that he has had some redness on the middle of his incision area. He stated that he doesn't have any oozing, no fever, no itching but still would like for [place] to take a look at. I told him to stop by for [place] to take a look at it and if he needed to be seen by one of the surgeons, that we would schedule it for tomorrow.

## 2014-11-27 ENCOUNTER — Encounter: Payer: Self-pay | Admitting: Surgery

## 2014-11-27 ENCOUNTER — Ambulatory Visit (INDEPENDENT_AMBULATORY_CARE_PROVIDER_SITE_OTHER): Payer: Medicare Other | Admitting: Surgery

## 2014-11-27 ENCOUNTER — Telehealth: Payer: Self-pay | Admitting: Surgery

## 2014-11-27 VITALS — BP 138/74 | HR 68 | Temp 98.3°F | Ht 73.0 in | Wt 173.0 lb

## 2014-11-27 DIAGNOSIS — K4090 Unilateral inguinal hernia, without obstruction or gangrene, not specified as recurrent: Secondary | ICD-10-CM

## 2014-11-27 MED ORDER — DOXYCYCLINE HYCLATE 50 MG PO CAPS: 50.0000 mg | ORAL_CAPSULE | Freq: Two times a day (BID) | ORAL | Status: DC

## 2014-11-27 NOTE — Progress Notes (Signed)
Outpatient postop visit  11/27/2014  Theodore Obrien is an 64 y.o. male.    Procedure: Left femoral hernia repair with mesh  CC drainage from wound  HPI: Patient states drainage started a few days ago and is purulent in nature denies fevers or chills no increase in pain.  Medications reviewed.    Physical Exam:  BP 138/74 mmHg  Pulse 68  Temp(Src) 98.3 F (36.8 C) (Oral)  Ht 6\' 1"  (1.854 m)  Wt 173 lb (78.472 kg)  BMI 22.83 kg/m2    PE: Minimal erythema only at the medial edge of the otherwise healing wound suggestive of suture abscess. No expressible purulence or fluid. Nontender wound and the remainder the wound is nonerythematous    Assessment/Plan:  Suspect suture abscess although there is no fluid expressible at this point. I would recommend starting the patient on anabiotic's I will prescribe doxycycline twice a day with instructions to follow-up if it worsens and certainly to be seen next week in the office.  Florene Glen, MD, FACS

## 2014-11-27 NOTE — Telephone Encounter (Signed)
Patient called regarding surgery 6/27 with Dr Marina Gravel (hernia) his incision is now open and has puss draining from same. Dr Burt Knack advised patient to come in to be seen this afternoon. 3:00pm appointment has been made.

## 2014-11-27 NOTE — Patient Instructions (Signed)
Follow-up next week in the [facility] office.  Please be sure to get your antibiotics at the pharmacy.

## 2014-11-27 NOTE — Telephone Encounter (Deleted)
Patient had surgery

## 2014-12-02 ENCOUNTER — Encounter: Payer: Medicare Other | Admitting: General Surgery

## 2014-12-03 ENCOUNTER — Encounter: Payer: Medicare Other | Admitting: General Surgery

## 2014-12-09 ENCOUNTER — Ambulatory Visit (INDEPENDENT_AMBULATORY_CARE_PROVIDER_SITE_OTHER): Payer: Medicare Other | Admitting: Surgery

## 2014-12-09 ENCOUNTER — Encounter: Payer: Self-pay | Admitting: Surgery

## 2014-12-09 VITALS — BP 137/78 | HR 69 | Temp 98.4°F | Ht 73.0 in | Wt 180.4 lb

## 2014-12-09 DIAGNOSIS — K4090 Unilateral inguinal hernia, without obstruction or gangrene, not specified as recurrent: Secondary | ICD-10-CM

## 2014-12-09 DIAGNOSIS — K469 Unspecified abdominal hernia without obstruction or gangrene: Secondary | ICD-10-CM | POA: Insufficient documentation

## 2014-12-09 NOTE — Progress Notes (Signed)
Surgery clinic  The patient was seen about a week ago by my associate placed on the site and 4 look like a suture abscess.  This morning he awoke with some drainage from his wound and made an appointment.  He denies any malaise fevers.  On physical examination neck appreciate no evidence of recurrent hernia on the left side. Along the medial aspect of the wound is a suture granuloma. Suture was visible and removed. This wound was debrided. And dressed.  Impression suture granuloma  Plan dry dressing,  follow-up 7-10 days.

## 2014-12-09 NOTE — Patient Instructions (Signed)
Please keep a dry dressing on this area until it heals.  You may shower but ensure that this area is dried well prior to placing dressing on incision area.  Please set-up an appointment to see [place] in 7-10 days.

## 2014-12-17 ENCOUNTER — Encounter: Payer: Self-pay | Admitting: Surgery

## 2014-12-17 ENCOUNTER — Encounter: Payer: Medicare Other | Admitting: Surgery

## 2014-12-17 ENCOUNTER — Ambulatory Visit (INDEPENDENT_AMBULATORY_CARE_PROVIDER_SITE_OTHER): Payer: Medicare Other | Admitting: Surgery

## 2014-12-17 VITALS — BP 132/80 | HR 73 | Temp 98.5°F | Ht 73.0 in | Wt 181.0 lb

## 2014-12-17 DIAGNOSIS — S31109D Unspecified open wound of abdominal wall, unspecified quadrant without penetration into peritoneal cavity, subsequent encounter: Secondary | ICD-10-CM

## 2014-12-17 DIAGNOSIS — Z09 Encounter for follow-up examination after completed treatment for conditions other than malignant neoplasm: Secondary | ICD-10-CM

## 2014-12-17 MED ORDER — SULFAMETHOXAZOLE-TRIMETHOPRIM 800-160 MG PO TABS: 1.0000 | ORAL_TABLET | Freq: Two times a day (BID) | ORAL | Status: DC

## 2014-12-17 NOTE — Progress Notes (Signed)
Surgery Progress Note  S: Doing well.  Wound healed then became red over the past few days.  Had a little area in middle that he said drained some old blood and white chunks.  Min pain.  No longer on antibiotics Blood pressure 132/80, pulse 73, temperature 98.5 F (36.9 C), temperature source Oral, height 6\' 1"  (1.854 m), weight 181 lb (82.101 kg). GEN: NAD/A&Ox3 GROIN: Cellulitis around length of wound.  Min tender. No swelling.  Small area palpated with cotton stab with no drainage  A/P 64 yo s/p FHR with postop stitch abscess, now with new cellulitis.  Began on bactrim. Gave instructions to wear occlusive dressing while working due to dirtiness of environment.  F/u in 1 week with Dr. Marina Gravel.  No indication for surgical intervention at this time.

## 2014-12-17 NOTE — Patient Instructions (Signed)
Do not drive on pain medications Do not lift greater than 15 lbs for a period of 6 weeks Call or return to ER if you develop fever greater than 101.5, nausea/vomiting, increased pain, redness/drainage from incisions Put occlusive dressing on in the morning before work. Take off after work before shower.

## 2014-12-23 ENCOUNTER — Ambulatory Visit (INDEPENDENT_AMBULATORY_CARE_PROVIDER_SITE_OTHER): Payer: Medicare Other | Admitting: Surgery

## 2014-12-23 ENCOUNTER — Encounter: Payer: Self-pay | Admitting: Surgery

## 2014-12-23 VITALS — BP 129/74 | HR 56 | Temp 97.2°F | Ht 73.0 in | Wt 180.0 lb

## 2014-12-23 DIAGNOSIS — K4090 Unilateral inguinal hernia, without obstruction or gangrene, not specified as recurrent: Secondary | ICD-10-CM

## 2014-12-23 NOTE — Patient Instructions (Signed)
Follow up as needed.   Please call with any questions or concerns.

## 2014-12-23 NOTE — Progress Notes (Signed)
Surgery Clinic  Status post left femoral and direct inguinal hernia repair with preperitoneal Kugel patch on 10/27/14. He's had minor superficial suture abscess. He was seen last weekend placed on oral antibiotics which he is nearly completed with now. He is feeling much better and there is been no further drainage.   PE:  Wound is nicely healed. There is no open areas. I cannot appreciate a recurrence.  Impression: Doing well.  Plan:  Activity and diet as tolerated I'll see him back in the office as needed.

## 2015-03-23 ENCOUNTER — Emergency Department
Admission: EM | Admit: 2015-03-23 | Discharge: 2015-03-24 | Disposition: A | Discharge status: Home / Self Care | Payer: Medicare Other | Attending: Emergency Medicine | Admitting: Emergency Medicine

## 2015-03-23 ENCOUNTER — Encounter: Payer: Self-pay | Admitting: *Deleted

## 2015-03-23 DIAGNOSIS — F1123 Opioid dependence with withdrawal: Secondary | ICD-10-CM | POA: Diagnosis not present

## 2015-03-23 DIAGNOSIS — R531 Weakness: Secondary | ICD-10-CM | POA: Insufficient documentation

## 2015-03-23 DIAGNOSIS — F1423 Cocaine dependence with withdrawal: Secondary | ICD-10-CM | POA: Diagnosis not present

## 2015-03-23 DIAGNOSIS — F10239 Alcohol dependence with withdrawal, unspecified: Secondary | ICD-10-CM | POA: Diagnosis not present

## 2015-03-23 DIAGNOSIS — R451 Restlessness and agitation: Secondary | ICD-10-CM | POA: Insufficient documentation

## 2015-03-23 DIAGNOSIS — F1120 Opioid dependence, uncomplicated: Secondary | ICD-10-CM

## 2015-03-23 DIAGNOSIS — R1110 Vomiting, unspecified: Secondary | ICD-10-CM | POA: Diagnosis not present

## 2015-03-23 DIAGNOSIS — R443 Hallucinations, unspecified: Secondary | ICD-10-CM | POA: Diagnosis not present

## 2015-03-23 DIAGNOSIS — R61 Generalized hyperhidrosis: Secondary | ICD-10-CM | POA: Diagnosis not present

## 2015-03-23 DIAGNOSIS — F1910 Other psychoactive substance abuse, uncomplicated: Secondary | ICD-10-CM

## 2015-03-23 DIAGNOSIS — I10 Essential (primary) hypertension: Secondary | ICD-10-CM | POA: Diagnosis not present

## 2015-03-23 DIAGNOSIS — F1110 Opioid abuse, uncomplicated: Secondary | ICD-10-CM | POA: Diagnosis present

## 2015-03-23 DIAGNOSIS — F17210 Nicotine dependence, cigarettes, uncomplicated: Secondary | ICD-10-CM | POA: Insufficient documentation

## 2015-03-23 LAB — URINE DRUG SCREEN, QUALITATIVE (ARMC ONLY)
Amphetamines, Ur Screen: NOT DETECTED
Barbiturates, Ur Screen: NOT DETECTED
Benzodiazepine, Ur Scrn: NOT DETECTED
Cannabinoid 50 Ng, Ur ~~LOC~~: NOT DETECTED
Cocaine Metabolite,Ur ~~LOC~~: POSITIVE — AB
MDMA (ECSTASY) UR SCREEN: NOT DETECTED
Methadone Scn, Ur: NOT DETECTED
OPIATE, UR SCREEN: POSITIVE — AB
Phencyclidine (PCP) Ur S: NOT DETECTED
Tricyclic, Ur Screen: NOT DETECTED

## 2015-03-23 LAB — COMPREHENSIVE METABOLIC PANEL
ALK PHOS: 70 U/L (ref 38–126)
ALT: 15 U/L — ABNORMAL LOW (ref 17–63)
AST: 19 U/L (ref 15–41)
Albumin: 4.2 g/dL (ref 3.5–5.0)
Anion gap: 9 (ref 5–15)
BUN: 13 mg/dL (ref 6–20)
CALCIUM: 9.5 mg/dL (ref 8.9–10.3)
CO2: 24 mmol/L (ref 22–32)
Chloride: 103 mmol/L (ref 101–111)
Creatinine, Ser: 0.93 mg/dL (ref 0.61–1.24)
GFR calc Af Amer: >60 mL/min (ref >60)
GFR calc non Af Amer: >60 mL/min (ref >60)
GLUCOSE: 183 mg/dL — AB (ref 65–99)
Potassium: 3.9 mmol/L (ref 3.5–5.1)
Sodium: 136 mmol/L (ref 135–145)
Total Bilirubin: 1.8 mg/dL — ABNORMAL HIGH (ref 0.3–1.2)
Total Protein: 8.2 g/dL — ABNORMAL HIGH (ref 6.5–8.1)

## 2015-03-23 LAB — ACETAMINOPHEN LEVEL: Acetaminophen (Tylenol), Serum: <10 ug/mL — ABNORMAL LOW (ref 10–30)

## 2015-03-23 LAB — CBC WITH DIFFERENTIAL/PLATELET
BASOS ABS: 0 10*3/uL (ref 0–0.1)
BASOS PCT: 1 %
EOS ABS: 0.1 10*3/uL (ref 0–0.7)
Eosinophils Relative: 1 %
HCT: 50.3 % (ref 40.0–52.0)
HEMOGLOBIN: 16.7 g/dL (ref 13.0–18.0)
LYMPHS ABS: 1.3 10*3/uL (ref 1.0–3.6)
Lymphocytes Relative: 14 %
MCH: 27.9 pg (ref 26.0–34.0)
MCHC: 33.1 g/dL (ref 32.0–36.0)
MCV: 84.1 fL (ref 80.0–100.0)
Monocytes Absolute: 0.6 10*3/uL (ref 0.2–1.0)
Monocytes Relative: 6 %
NEUTROS PCT: 78 %
Neutro Abs: 7.5 10*3/uL — ABNORMAL HIGH (ref 1.4–6.5)
Platelets: 210 10*3/uL (ref 150–440)
RBC: 5.98 MIL/uL — AB (ref 4.40–5.90)
RDW: 13.3 % (ref 11.5–14.5)
WBC: 9.6 10*3/uL (ref 3.8–10.6)

## 2015-03-23 LAB — ETHANOL: Alcohol, Ethyl (B): <5 mg/dL (ref <5)

## 2015-03-23 LAB — SALICYLATE LEVEL

## 2015-03-23 MED ORDER — LORAZEPAM 2 MG/ML IJ SOLN
0.0000 mg | Freq: Four times a day (QID) | INTRAMUSCULAR | Status: DC
  Administered 2015-03-23 (×3): 2 mg via INTRAVENOUS
  Filled 2015-03-23 (×3): qty 1

## 2015-03-23 MED ORDER — THIAMINE HCL 100 MG/ML IJ SOLN
100.0000 mg | Freq: Every day | INTRAMUSCULAR | Status: DC
  Administered 2015-03-23: 100 mg via INTRAVENOUS
  Filled 2015-03-23: qty 2

## 2015-03-23 MED ORDER — LORAZEPAM 2 MG PO TABS: 0.0000 mg | ORAL_TABLET | Freq: Four times a day (QID) | ORAL | Status: DC

## 2015-03-23 MED ORDER — VITAMIN B-1 100 MG PO TABS
100.0000 mg | ORAL_TABLET | Freq: Every day | ORAL | Status: DC
  Administered 2015-03-24: 100 mg via ORAL
  Filled 2015-03-23: qty 1

## 2015-03-23 MED ORDER — BUPRENORPHINE HCL 8 MG SL SUBL
8.0000 mg | SUBLINGUAL_TABLET | Freq: Every day | SUBLINGUAL | Status: DC
  Administered 2015-03-24: 8 mg via SUBLINGUAL
  Filled 2015-03-23: qty 1

## 2015-03-23 MED ORDER — CLONIDINE HCL 0.1 MG PO TABS
0.1000 mg | ORAL_TABLET | Freq: Two times a day (BID) | ORAL | Status: DC
  Administered 2015-03-23 – 2015-03-24 (×2): 0.1 mg via ORAL
  Filled 2015-03-23 (×2): qty 1

## 2015-03-23 MED ORDER — LORAZEPAM 2 MG PO TABS: 0.0000 mg | ORAL_TABLET | Freq: Two times a day (BID) | ORAL | Status: DC

## 2015-03-23 MED ORDER — SODIUM CHLORIDE 0.9 % IV SOLN: Freq: Once | INTRAVENOUS | Status: DC

## 2015-03-23 MED ORDER — LORAZEPAM 2 MG/ML IJ SOLN
0.0000 mg | Freq: Two times a day (BID) | INTRAMUSCULAR | Status: DC
  Administered 2015-03-23: 2 mg via INTRAVENOUS
  Filled 2015-03-23: qty 1

## 2015-03-23 MED ORDER — BUPRENORPHINE HCL 8 MG SL SUBL
8.0000 mg | SUBLINGUAL_TABLET | SUBLINGUAL | Status: AC
  Administered 2015-03-23: 8 mg via SUBLINGUAL
  Filled 2015-03-23: qty 1

## 2015-03-23 NOTE — ED Notes (Signed)
He is awake  - lying in bed rolling back and forht - moaning with loud speech  "Get me some medicine - i am gonna die."  Pt reassured    Med administered as ordered

## 2015-03-23 NOTE — ED Notes (Signed)
Pt moving around and tossing and turning in bed.  Up at times, but redirectable to stay in room.  NAD. Cooperative with POC.

## 2015-03-23 NOTE — ED Notes (Signed)
BEHAVIORAL HEALTH ROUNDING Patient sleeping: No. Patient alert and oriented: yes Behavior appropriate: Yes.  ; If no, describe:  Nutrition and fluids offered: yes Toileting and hygiene offered: Yes  Sitter present: q15 minute observations and security monitoring   ENVIRONMENTAL ASSESSMENT Potentially harmful objects out of patient reach: Yes.   Personal belongings secured: Yes.   Patient dressed in hospital provided attire only: Yes.   Plastic bags out of patient reach: Yes.   Patient care equipment (cords, cables, call bells, lines, and drains) shortened, removed, or accounted for: Yes.   Equipment and supplies removed from bottom of stretcher: Yes.   Potentially toxic materials out of patient reach: Yes.   Sharps container removed or out of patient reach: Yes.   monitoring Law enforcement present: Yes  ODS

## 2015-03-23 NOTE — ED Notes (Signed)
Pt's sister  Theodore Obrien has visited and she is his POA  Phone numbers placed on ED behavioral sheet   Family reassured  Pt resting quietly  Continue to monitor

## 2015-03-23 NOTE — Consult Note (Signed)
[facility] Face-to-Face Psychiatry Consult   Reason for Consult:  Consult for this 64 year old man who comes into the hospital requesting help with heroin abuse Referring Physician:  Thomasene Lot Patient Identification: Theodore Obrien MRN:  [id number] Principal Diagnosis: Opiate dependence [facility]) Diagnosis:   Patient Active Problem List   Diagnosis Date Noted  . Opiate dependence ([place]) [F11.20] 03/23/2015  . Hernia of abdominal cavity [K46.9] 12/09/2014    Total Time spent with patient: 45 minutes  Subjective:   Theodore Obrien is a 64 y.o. male patient admitted with "I need help getting off heroin".  HPI:  Patient presented voluntarily requesting assistance stopping the use of heroin. He states that he has been using IV heroin 1 for about 5-6 months. Denies that he is using any other drugs. Last use was yesterday morning. He also admits that he drinks a bit but denies history of DTs or seizures. Mood is been feeling anxious and dysphoric but he denies any thoughts of killing himself. Denies any hallucinations. He had been sober for up to 18 months in the past before relapsing about 6 months ago. Motivated by realizing how sick he is getting and how much it is impairing his life.  Social history: Patient lives by himself. 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 Has family who are supportive of him.  Medical history: He is currently running high blood pressure but denies any significant ongoing medical problems  Substance abuse history: History of opiate dependence going back several years.  Past Psychiatric History: No history of suicide attempts. No other history of significant psychiatric problems that he identifies. He has been to rehabilitation and detox treatment in the past.  Risk to Self: Suicidal Ideation: No Suicidal Intent: No Is patient at risk for suicide?: No Suicidal Plan?: No Access to Means: No What has been your use of drugs/alcohol within the last 12 months?: Heroin How many times?: 0 Other  Self Harm Risks: Active Addiction Triggers for Past Attempts: Unknown Intentional Self Injurious Behavior: None Risk to Others: Homicidal Ideation: No Thoughts of Harm to Others: No Current Homicidal Intent: No Current Homicidal Plan: No Access to Homicidal Means: No Identified Victim: None Reported History of harm to others?: No Assessment of Violence: None Noted Violent Behavior Description: None reported or noted Does patient have access to weapons?: No Criminal Charges Pending?: No Does patient have a court date: No Prior Inpatient Therapy: Prior Inpatient Therapy: Yes Prior Therapy Dates: 04/2012 Prior Therapy Facilty[facility]Provider[facility]): [facility] (In [place], [place]) Reason for Treatment: SA Treatment Prior Outpatient Therapy: Prior Outpatient Therapy: Yes Prior Therapy Dates: Current Prior Therapy Facilty/Provider(s): [facility] Reason for Treatment: Depression Does patient have an ACCT team?: No Does patient have Intensive In-House Services?  : No Does patient have [facility] services? : No Does patient have P4CC services?: No  Past Medical History:  Past Medical History  Diagnosis Date  . Hypertension   . GERD (gastroesophageal reflux disease)     Past Surgical History  Procedure Laterality Date  . Fracture surgery      right leg  . Carpal tunnel release Left   . Femoral hernia repair Left 10/27/2014    Procedure: Repair of left femoral and direct hernia with mesh ;  Surgeon: Sherri Rad, MD;  Location: [facility] ORS;  Service: General;  Laterality: Left;   Family History:  Family History  Problem Relation Age of Onset  . Hypertension Mother    Family Psychiatric  History: Denies any family history of substance abuse or mental health  problems Social History:  History  Alcohol Use  . Yes    Comment: occasionally beer     History  Drug Use No    Social History   Social History  . Marital Status: Legally Separated    Spouse Name: N/A  . Number of  Children: N/A  . Years of Education: N/A   Social History Main Topics  . Smoking status: Current Every Day Smoker -- 1.00 packs/day for 5 years    Types: Cigarettes  . Smokeless tobacco: Never Used  . Alcohol Use: Yes     Comment: occasionally beer  . Drug Use: No  . Sexual Activity: Not Asked   Other Topics Concern  . None   Social History Narrative   Additional Social History:    Pain Medications: See PTA Prescriptions: See PTA Over the Counter: See PTA History of alcohol / drug use?: Yes Longest period of sobriety (when/how long): Unknown Negative Consequences of Use: Financial, Personal relationships Name of Substance 1: Heroin 1 - Age of First Use: 64 1 - Amount (size/oz): Unknown 1 - Frequency: Daily 1 - Duration: 6 months 1 - Last Use / Amount: 03/23/2015                   Allergies:  No Known Allergies  Labs:  Results for orders placed or performed during the hospital encounter of 03/23/15 (from the past 48 hour(s))  CBC with Differential/Platelet     Status: Abnormal   Collection Time: 03/23/15  9:22 AM  Result Value Ref Range   WBC 9.6 3.8 - 10.6 K/uL   RBC 5.98 (H) 4.40 - 5.90 MIL/uL   Hemoglobin 16.7 13.0 - 18.0 g/dL   HCT 50.3 40.0 - 52.0 %   MCV 84.1 80.0 - 100.0 fL   MCH 27.9 26.0 - 34.0 pg   MCHC 33.1 32.0 - 36.0 g/dL   RDW 13.3 11.5 - 14.5 %   Platelets 210 150 - 440 K/uL   Neutrophils Relative % 78 %   Neutro Abs 7.5 (H) 1.4 - 6.5 K/uL   Lymphocytes Relative 14 %   Lymphs Abs 1.3 1.0 - 3.6 K/uL   Monocytes Relative 6 %   Monocytes Absolute 0.6 0.2 - 1.0 K/uL   Eosinophils Relative 1 %   Eosinophils Absolute 0.1 0 - 0.7 K/uL   Basophils Relative 1 %   Basophils Absolute 0.0 0 - 0.1 K/uL  Comprehensive metabolic panel     Status: Abnormal   Collection Time: 03/23/15  9:22 AM  Result Value Ref Range   Sodium 136 135 - 145 mmol/L   Potassium 3.9 3.5 - 5.1 mmol/L   Chloride 103 101 - 111 mmol/L   CO2 24 22 - 32 mmol/L   Glucose,  Bld 183 (H) 65 - 99 mg/dL   BUN 13 6 - 20 mg/dL   Creatinine, Ser 0.93 0.61 - 1.24 mg/dL   Calcium 9.5 8.9 - 10.3 mg/dL   Total Protein 8.2 (H) 6.5 - 8.1 g/dL   Albumin 4.2 3.5 - 5.0 g/dL   AST 19 15 - 41 U/L   ALT 15 (L) 17 - 63 U/L   Alkaline Phosphatase 70 38 - 126 U/L   Total Bilirubin 1.8 (H) 0.3 - 1.2 mg/dL   GFR calc non Af Amer >60 >60 mL/min   GFR calc Af Amer >60 >60 mL/min    Comment: (NOTE) The eGFR has been calculated using the CKD EPI equation. This calculation has not  been validated in all clinical situations. eGFR's persistently <60 mL/min signify possible Chronic Kidney Disease.    Anion gap 9 5 - 15  Ethanol     Status: None   Collection Time: 03/23/15  9:22 AM  Result Value Ref Range   Alcohol, Ethyl (B) <5 <5 mg/dL    Comment:        LOWEST DETECTABLE LIMIT FOR SERUM ALCOHOL IS 5 mg/dL FOR MEDICAL PURPOSES ONLY   Urine Drug Screen, Qualitative ([facility] only)     Status: Abnormal   Collection Time: 03/23/15  9:22 AM  Result Value Ref Range   Tricyclic, Ur Screen NONE DETECTED NONE DETECTED   Amphetamines, Ur Screen NONE DETECTED NONE DETECTED   MDMA (Ecstasy)Ur Screen NONE DETECTED NONE DETECTED   Cocaine Metabolite,Ur [place] POSITIVE (A) NONE DETECTED   Opiate, Ur Screen POSITIVE (A) NONE DETECTED   Phencyclidine (PCP) Ur S NONE DETECTED NONE DETECTED   Cannabinoid 50 Ng, Ur [place] NONE DETECTED NONE DETECTED   Barbiturates, Ur Screen NONE DETECTED NONE DETECTED   Benzodiazepine, Ur Scrn NONE DETECTED NONE DETECTED   Methadone Scn, Ur NONE DETECTED NONE DETECTED    Comment: (NOTE) 147  Tricyclics, urine               Cutoff 1000 ng/mL 200  Amphetamines, urine             Cutoff 1000 ng/mL 300  MDMA (Ecstasy), urine           Cutoff 500 ng/mL 400  Cocaine Metabolite, urine       Cutoff 300 ng/mL 500  Opiate, urine                   Cutoff 300 ng/mL 600  Phencyclidine (PCP), urine      Cutoff 25 ng/mL 700  Cannabinoid, urine              Cutoff 50 ng/mL 800   Barbiturates, urine             Cutoff 200 ng/mL 900  Benzodiazepine, urine           Cutoff 200 ng/mL 1000 Methadone, urine                Cutoff 300 ng/mL 1100 1200 The urine drug screen provides only a preliminary, unconfirmed 1300 analytical test result and should not be used for non-medical 1400 purposes. Clinical consideration and professional judgment should 1500 be applied to any positive drug screen result due to possible 1600 interfering substances. A more specific alternate chemical method 1700 must be used in order to obtain a confirmed analytical result.  1800 Gas chromato graphy / mass spectrometry (GC/MS) is the preferred 1900 confirmatory method.   Acetaminophen level     Status: Abnormal   Collection Time: 03/23/15  9:22 AM  Result Value Ref Range   Acetaminophen (Tylenol), Serum <10 (L) 10 - 30 ug/mL    Comment:        THERAPEUTIC CONCENTRATIONS VARY SIGNIFICANTLY. A RANGE OF 10-30 ug/mL MAY BE AN EFFECTIVE CONCENTRATION FOR MANY PATIENTS. HOWEVER, SOME ARE BEST TREATED AT CONCENTRATIONS OUTSIDE THIS RANGE. ACETAMINOPHEN CONCENTRATIONS >150 ug/mL AT 4 HOURS AFTER INGESTION AND >50 ug/mL AT 12 HOURS AFTER INGESTION ARE OFTEN ASSOCIATED WITH TOXIC REACTIONS.   Salicylate level     Status: None   Collection Time: 03/23/15  9:22 AM  Result Value Ref Range   Salicylate Lvl <8.2 2.8 - 30.0 mg/dL    Current Facility-Administered  Medications  Medication Dose Route Frequency Provider Last Rate Last Dose  . 0.9 %  sodium chloride infusion   Intravenous Once Earleen Newport, MD   Stopped at 03/23/15 1117  . buprenorphine (SUBUTEX) sublingual tablet 8 mg  8 mg Sublingual STAT Gonzella Lex, MD      . Derrill Memo ON 03/24/2015] buprenorphine (SUBUTEX) sublingual tablet 8 mg  8 mg Sublingual Daily Gonzella Lex, MD      . thiamine (B-1) injection 100 mg  100 mg Intravenous Daily Earleen Newport, MD   100 mg at 03/23/15 1110  . thiamine (VITAMIN B-1) tablet 100 mg   100 mg Oral Daily Earleen Newport, MD   100 mg at 03/23/15 1054   Current Outpatient Prescriptions  Medication Sig Dispense Refill  . carvedilol (COREG) 3.125 MG tablet Take 3.125 mg by mouth 2 (two) times daily.    . cloNIDine (CATAPRES) 0.1 MG tablet Take 0.1 mg by mouth 3 (three) times daily.    Marland Kitchen sulfamethoxazole-trimethoprim (BACTRIM DS,SEPTRA DS) 800-160 MG per tablet Take 1 tablet by mouth 2 (two) times daily. 20 tablet 1    Musculoskeletal: Strength & Muscle Tone: spastic Gait & Station: ataxic Patient leans: N/A  Psychiatric Specialty Exam: Review of Systems  Constitutional: Positive for chills and diaphoresis.  HENT: Negative.   Eyes: Negative.   Respiratory: Negative.   Cardiovascular: Negative.   Gastrointestinal: Positive for nausea and diarrhea. Negative for vomiting.  Musculoskeletal: Positive for myalgias.  Skin: Negative.   Neurological: Negative.   Psychiatric/Behavioral: Positive for substance abuse. Negative for depression, suicidal ideas, hallucinations and memory loss. The patient is nervous/anxious and has insomnia.     Blood pressure 174/107, pulse 80, temperature 98 F (36.7 C), temperature source Oral, resp. rate 20, height 6' (1.829 m), weight 81.647 kg (180 lb), SpO2 100 %.Body mass index is 24.41 kg/(m^2).  General Appearance: Disheveled  Eye Contact::  None  Speech:  Garbled and Slurred  Volume:  Decreased  Mood:  Dysphoric  Affect:  Labile  Thought Process:  Tangential  Orientation:  Full (Time, Place, and Person)  Thought Content:  Negative  Suicidal Thoughts:  No  Homicidal Thoughts:  No  Memory:  Immediate;   Fair Recent;   Fair Remote;   Fair  Judgement:  Intact  Insight:  Present  Psychomotor Activity:  Decreased  Concentration:  Fair  Recall:  [employer] of Knowledge:Fair  Language: Fair  Akathisia:  No  Handed:  Right  AIMS (if indicated):     Assets:  Desire for Improvement Financial Resources/Insurance Physical  Health Vocational/Educational  ADL's:  Impaired  Cognition: WNL  Sleep:      Treatment Plan Summary: Medication management and Plan 64 year old man requesting assistance with opiate abuse. By the time I came to interview him he looked to be in quite a bit of discomfort. Made no eye contact. Speech was telegraphic and at times hard to understand. He looked like he was in a lot of pain. Patient however is not suicidal or homicidal has no psychotic symptoms. Does not require inpatient psychiatric hospitalization. I have talked with the DTS worker and recommend that the patient be referred to observation. He will be given a single dose of Subutex tonight and continue daily Subutex for 2 more days if he is still here. I discontinued the alcohol detox protocol as not being indicated. Blood pressure needs to be monitored as he may need some treatment for that. I'm going to go  ahead and put him on some clonidine in the meantime. No need for IVC  Disposition: Patient does not meet criteria for psychiatric inpatient admission. Supportive therapy provided about ongoing stressors.  Theodore Obrien 03/23/2015 7:26 PM

## 2015-03-23 NOTE — ED Notes (Signed)
BEHAVIORAL HEALTH ROUNDING Patient sleeping: No. Patient alert and oriented: yes Behavior appropriate: Yes.  ; If no, describe:  Nutrition and fluids offered: yes Toileting and hygiene offered: Yes  Sitter present: q15 minute observations and security  monitoring Law enforcement present: Yes  ODS  

## 2015-03-23 NOTE — ED Notes (Signed)
BEHAVIORAL HEALTH ROUNDING Patient sleeping: Yes.   Patient alert and oriented: eyes closed  Appears asleep Behavior appropriate: Yes.  ; If no, describe:  Nutrition and fluids offered: Yes  Toileting and hygiene offered: sleeping Sitter present: q 15 minute observations and security monitoring Law enforcement present: yes  ODS 

## 2015-03-23 NOTE — BHH Counselor (Signed)
Pt has signed [facility] Obs voluntary admission form. Writer has spoke with [facility] AC (Tori [phone]) and AC will review Pt's chart for admission.  

## 2015-03-23 NOTE — ED Notes (Signed)
BEHAVIORAL HEALTH ROUNDING Patient sleeping: YES Patient alert and oriented: YES Behavior appropriate: YES Describe behavior: No inappropriate or unacceptable behaviors noted at this time.  Nutrition and fluids offered: YES Toileting and hygiene offered: YES Sitter present: Behavioral tech rounding every 15 minutes on patient to ensure safety.  Law enforcement present: YES Law enforcement agency: [employer] (ODS)  ENVIRONMENTAL ASSESSMENT Potentially harmful objects out of patient reach: YES Personal belongings secured: YES Patient dressed in hospital provided attire only: YES Plastic bags out of patient reach: YES Patient care equipment (cords, cables, call bells, lines, and drains) shortened, removed, or accounted for: YES Equipment and supplies removed from bottom of stretcher: YES Potentially toxic materials out of patient reach: YES Sharps container removed or out of patient reach: YES 

## 2015-03-23 NOTE — ED Provider Notes (Signed)
-----------------------------------------   9:17 PM on 03/23/2015 -----------------------------------------  Patient has been seen by TTS area they have arranged for him to be cared for in the observation unit at [facility]. They went to have the patient sign consent forms but he seems somnolent. I have reassessed the patient area he woke up with me and was communicative, though did seem somnolent. He also felt warm to touch. A repeat of the temperature reading finds him to be 98.8.     Ahmed Prima, MD 03/23/15 [phone]

## 2015-03-23 NOTE — BH Assessment (Signed)
Unable to assess patient at this time. Patient was giving medication.

## 2015-03-23 NOTE — ED Notes (Signed)
Pt is withdrawing from heroin, pt is hallunicating , having jerking movements, pt is alert and oriented, pt is attempting to get out of wheelchair, pt last used yesterday morning

## 2015-03-23 NOTE — BHH Counselor (Signed)
Pt sleeping and unable to arouse for signing of [facility] Obs voluntary admission and consent.

## 2015-03-23 NOTE — BH Assessment (Signed)
Assessment Note  Theodore Obrien is an 64 y.o. male who presents to to the ER, via is history due to calling her and wanting help for his heroin use. Patient states, he has used, for the last 6 months and want to stop. Per the report of his sister Theodore Obrien-[phone](c) or [phone])) the patient was doing good for the last several years. Only thing she was aware of was, his use in 04/2012. At that time, he was seen at [facility] and then transferred to [facility] for ongoing observation and monitoring. He was then transferred to [facility] in [place], [place]. He was in the program for approximately 3 1/2 months.  He currently lives alone and his sister helps him time to time. They recently decided to have him, take care of his own financial affairs.   Patient is under the psychiatric care of Dr. Kasandra Knudsen, with [facility], in [place], [place].  During the the interview, the patient was pleaseant and cooperative. He was unable to sit still and and stay in one place. He was easily distracted but it wasn't difficult to redirect.  He denies SI/HI and AV/H.  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 Hypertension   . GERD (gastroesophageal reflux disease)     Past Surgical History  Procedure Laterality Date  . Fracture surgery      right leg  . Carpal tunnel release Left   . Femoral hernia repair Left 10/27/2014    Procedure: Repair of left femoral and direct hernia with mesh ;  Surgeon: Sherri Rad, MD;  Location: [facility] ORS;  Service: General;  Laterality: Left;    Family History:  Family History  Problem Relation Age of Onset  . Hypertension Mother     Social History:  reports that he has been smoking Cigarettes.  He has a 5 pack-year smoking history. He has never used smokeless tobacco. He reports that he drinks alcohol. He reports that he does not use illicit drugs.  Additional Social History:   Alcohol / Drug Use Pain Medications: See PTA Prescriptions: See PTA Over the Counter: See PTA History of alcohol / drug use?: Yes Longest period of sobriety (when/how long): Unknown Negative Consequences of Use: Financial, Personal relationships Substance #1 Name of Substance 1: Heroin 1 - Age of First Use: 64 1 - Amount (size/oz): Unknown 1 - Frequency: Daily 1 - Duration: 6 months 1 - Last Use / Amount: 03/23/2015  CIWA: CIWA-Ar BP: (!) 174/107 mmHg Pulse Rate: 80 COWS:    Allergies: No Known Allergies  Home Medications:  (Not in a hospital admission)  OB/GYN Status:  No LMP for male patient.  General Assessment Data Location of Assessment: [facility] ED TTS Assessment: In system Is this a Tele or Face-to-Face Assessment?: Face-to-Face Is this an Initial Assessment or a Re-assessment for this encounter?: Initial Assessment Marital status: Single Maiden name: n/a Is patient pregnant?: No Pregnancy Status: No Living Arrangements: Alone Can pt return to current living arrangement?: Yes Admission Status: Voluntary Is patient capable of signing voluntary admission?: Yes Referral Source: Self/Family/Friend Insurance type: n/a  Medical Screening Exam ([place]) Medical Exam completed: Yes  Crisis Care Plan Living Arrangements: Alone Name of Psychiatrist: Dr. Kasandra Knudsen [facility] ) Name of Therapist: None  Education Status Is patient currently in school?: No Current Grade: n/a Highest grade of school patient has completed: 12th Grade Name of school: n/a Contact person:  n/a  Risk to self with the past 6 months Suicidal Ideation: No Has patient been a risk to self within the past 6 months prior to admission? : No Suicidal Intent: No Has patient had any suicidal intent within the past 6 months prior to admission? : No Is patient at risk for suicide?: No Suicidal Plan?: No Has patient had any suicidal plan within the past 6 months prior to admission? :  No Access to Means: No What has been your use of drugs/alcohol within the last 12 months?: Heroin Previous Attempts/Gestures: No How many times?: 0 Other Self Harm Risks: Active Addiction Triggers for Past Attempts: Unknown Intentional Self Injurious Behavior: None Family Suicide History: No Recent stressful life event(s): Other (Comment) (Active Addiction ) Persecutory voices/beliefs?: No Depression: Yes Depression Symptoms: Feeling angry/irritable, Feeling worthless/self pity, Guilt, Fatigue, Isolating Substance abuse history and/or treatment for substance abuse?: Yes Suicide prevention information given to non-admitted patients: Not applicable  Risk to Others within the past 6 months Homicidal Ideation: No Does patient have any lifetime risk of violence toward others beyond the six months prior to admission? : No Thoughts of Harm to Others: No Current Homicidal Intent: No Current Homicidal Plan: No Access to Homicidal Means: No Identified Victim: None Reported History of harm to others?: No Assessment of Violence: None Noted Violent Behavior Description: None reported or noted Does patient have access to weapons?: No Criminal Charges Pending?: No Does patient have a court date: No Is patient on probation?: No  Psychosis Hallucinations: None noted Delusions: None noted  Mental Status Report Appearance/Hygiene: In hospital gown, Unremarkable, In scrubs Eye Contact: Poor Motor Activity: Unremarkable, Freedom of movement Speech: Pressured, Soft Level of Consciousness: Alert Mood: Labile, Suspicious, Pleasant Affect: Anxious, Appropriate to circumstance, Preoccupied Anxiety Level: Moderate Thought Processes: Coherent, Relevant Judgement: Impaired Orientation: Person, Place, Time, Situation, Appropriate for developmental age Obsessive Compulsive Thoughts/Behaviors: Moderate  Cognitive Functioning Concentration: Decreased Memory: Recent Impaired, Remote Intact IQ:  Average Insight: Fair Impulse Control: Poor Appetite: Fair Weight Loss: 0 Weight Gain: 0 Sleep: Decreased Total Hours of Sleep: 5 Vegetative Symptoms: None  ADLScreening [facility] Assessment Services) Patient's cognitive ability adequate to safely complete daily activities?: Yes Patient able to express need for assistance with ADLs?: Yes Independently performs ADLs?: Yes (appropriate for developmental age)  Prior Inpatient Therapy Prior Inpatient Therapy: Yes Prior Therapy Dates: 04/2012 Prior Therapy Facilty/Provider(s): [facility] (In [place], [place]) Reason for Treatment: SA Treatment  Prior Outpatient Therapy Prior Outpatient Therapy: Yes Prior Therapy Dates: Current Prior Therapy Facilty/Provider(s): [facility] Reason for Treatment: Depression Does patient have an ACCT team?: No Does patient have Intensive In-House Services?  : No Does patient have [facility] services? : No Does patient have P4CC services?: No  ADL Screening (condition at time of admission) Patient's cognitive ability adequate to safely complete daily activities?: Yes Is the patient deaf or have difficulty hearing?: No Does the patient have difficulty seeing, even when wearing glasses/contacts?: No Does the patient have difficulty concentrating, remembering, or making decisions?: No Patient able to express need for assistance with ADLs?: Yes Does the patient have difficulty dressing or bathing?: No Independently performs ADLs?: Yes (appropriate for developmental age) Does the patient have difficulty walking or climbing stairs?: No Weakness of Legs: None Weakness of Arms/Hands: None  Home Assistive Devices/Equipment Home Assistive Devices/Equipment: None  Therapy Consults (therapy consults require a physician order) PT Evaluation Needed: No OT Evalulation Needed: No SLP Evaluation Needed: No Abuse/Neglect Assessment (Assessment to be complete while  patient is alone) Physical Abuse:  Denies Verbal Abuse: Denies Sexual Abuse: Denies Exploitation of patient/patient's resources: Denies Self-Neglect: Denies Values / Beliefs Cultural Requests During Hospitalization: None Spiritual Requests During Hospitalization: None Consults Spiritual Care Consult Needed: No Social Work Consult Needed: No Regulatory affairs officer (For Healthcare) Does patient have an advance directive?:  (unable to assess)    Additional Information 1:1 In Past 12 Months?: No CIRT Risk: No Elopement Risk: No Does patient have medical clearance?: Yes  Child/Adolescent Assessment Running Away Risk: Denies (Patient is an adult)  Disposition:  Disposition Initial Assessment Completed for this Encounter: Yes Disposition of Patient: Other dispositions (Psych MD to see) Other disposition(s): Other (Comment) (Psych MD to see)  On Site Evaluation by:   Reviewed with Physician:    Gunnar Fusi, MS, LCAS, LPC, [place], CCSI 03/23/2015 5:16 PM

## 2015-03-23 NOTE — ED Notes (Signed)
Meal provided  Patient observed lying in bed with eyes closed  Even, unlabored respirations observed   NAD pt appears to be sleeping  I will continue to monitor along with every 15 minute visual observations and ongoing security camera monitoring

## 2015-03-23 NOTE — ED Notes (Signed)
Supper provided along with an extra drink  Pt observed with no unusual behavior  Appropriate to stimulation  No verbalized needs or concerns at this time  NAD assessed  Continue to monitor 

## 2015-03-23 NOTE — ED Provider Notes (Signed)
[facility] Emergency Department Provider Note     Time seen: ----------------------------------------- 9:36 AM on 03/23/2015 -----------------------------------------    I have reviewed the triage vital signs and the nursing notes.   HISTORY  Chief Complaint Withdrawal    HPI Theodore Obrien is a 64 y.o. male who presents ER withdrawn from heroin and alcohol. Patient was hallucinating and having jerking movements. Patient last used yesterday morning. Patient does inject heroin, nothing makes his symptoms better or worse.   Past Medical History  Diagnosis Date  . Hypertension   . GERD (gastroesophageal reflux disease)     Patient Active Problem List   Diagnosis Date Noted  . Hernia of abdominal cavity 12/09/2014    Past Surgical History  Procedure Laterality Date  . Fracture surgery      right leg  . Carpal tunnel release Left   . Femoral hernia repair Left 10/27/2014    Procedure: Repair of left femoral and direct hernia with mesh ;  Surgeon: Sherri Rad, MD;  Location: [facility] ORS;  Service: General;  Laterality: Left;    Allergies Review of patient's allergies indicates no known allergies.  Social History Social History  Substance Use Topics  . Smoking status: Current Every Day Smoker -- 1.00 packs/day for 5 years    Types: Cigarettes  . Smokeless tobacco: Never Used  . Alcohol Use: Yes     Comment: occasionally beer    Review of Systems Constitutional: Negative for fever. Eyes: Negative for visual changes. ENT: Negative for sore throat. Cardiovascular: Negative for chest pain. Respiratory: Negative for shortness of breath. Gastrointestinal: Negative for abdominal pain, positive for vomiting Genitourinary: Negative for dysuria. Musculoskeletal: Negative for back pain. Skin: Positive for diaphoresis Neurological: Negative for headaches, positive for weakness  10-point ROS otherwise  negative.  ____________________________________________   PHYSICAL EXAM:  VITAL SIGNS: ED Triage Vitals  Enc Vitals Group     BP 03/23/15 0902 174/107 mmHg     Pulse Rate 03/23/15 0902 80     Resp 03/23/15 0902 20     Temp 03/23/15 0902 98 F (36.7 C)     Temp Source 03/23/15 0902 Oral     SpO2 03/23/15 0902 100 %     Weight 03/23/15 0902 180 lb (81.647 kg)     Height 03/23/15 0902 6' (1.829 m)     Head Cir --      Peak Flow --      Pain Score --      Pain Loc --      Pain Edu? --      Excl. in [place]? --     Constitutional: Alert and oriented, moderate distress Eyes: Conjunctivae are normal. PERRL. Normal extraocular movements. ENT   Head: Normocephalic and atraumatic.   Nose: No congestion/rhinnorhea.   Mouth/Throat: Mucous membranes are moist.   Neck: No stridor. Cardiovascular: Normal rate, regular rhythm. Normal and symmetric distal pulses are present in all extremities. No murmurs, rubs, or gallops. Respiratory: Normal respiratory effort without tachypnea nor retractions. Breath sounds are clear and equal bilaterally. No wheezes/rales/rhonchi. Gastrointestinal: Soft and nontender. No distention. No abdominal bruits.  Musculoskeletal: Nontender with normal range of motion in all extremities. Old track marks are noted in his upper extremities. Neurologic:  Normal speech and language. No gross focal neurologic deficits are appreciated. Patient agitated Skin:  Skin is warm, dry and intact. No rash noted. Psychiatric: Patient agitated, rapid speech.  ____________________________________________  ED COURSE:  Pertinent labs & imaging results that were  available during my care of the patient were reviewed by me and considered in my medical decision making (see chart for details). Patient in significant withdrawal. We'll place on CIWA protocol. ____________________________________________    LABS (pertinent positives/negatives)  Labs Reviewed  CBC WITH  DIFFERENTIAL/PLATELET - Abnormal; Notable for the following:    RBC 5.98 (*)    Neutro Abs 7.5 (*)    All other components within normal limits  COMPREHENSIVE METABOLIC PANEL - Abnormal; Notable for the following:    Glucose, Bld 183 (*)    Total Protein 8.2 (*)    ALT 15 (*)    Total Bilirubin 1.8 (*)    All other components within normal limits  ACETAMINOPHEN LEVEL - Abnormal; Notable for the following:    Acetaminophen (Tylenol), Serum <10 (*)    All other components within normal limits  ETHANOL  SALICYLATE LEVEL  URINE DRUG SCREEN, QUALITATIVE ([facility] ONLY)    ____________________________________________  FINAL ASSESSMENT AND PLAN  Acute withdrawal  Plan: Patient with labs and imaging as dictated above. Patient in severe withdrawal and also agitated. CIWA protocol be placed. He was also given a saline bolus, will likely need inpatient detox.   Earleen Newport, MD   Earleen Newport, MD 03/23/15 [phone]

## 2015-03-23 NOTE — ED Notes (Signed)

## 2015-03-24 DIAGNOSIS — F1123 Opioid dependence with withdrawal: Secondary | ICD-10-CM | POA: Insufficient documentation

## 2015-03-24 MED ORDER — LORAZEPAM 1 MG PO TABS
ORAL_TABLET | ORAL | Status: AC
  Filled 2015-03-24: qty 1

## 2015-03-24 MED ORDER — LORAZEPAM 1 MG PO TABS
1.0000 mg | ORAL_TABLET | Freq: Once | ORAL | Status: AC
  Administered 2015-03-24: 1 mg via ORAL

## 2015-03-24 NOTE — ED Notes (Signed)
BEHAVIORAL HEALTH ROUNDING Patient sleeping: Yes.   Patient alert and oriented: not applicable pt asleep Behavior appropriate: Yes.  ; If no, describe:  Nutrition and fluids offered: No Toileting and hygiene offered: No Sitter present: ED Rover Present Law enforcement present: Yes

## 2015-03-24 NOTE — Consult Note (Signed)
  Psychiatry: Follow-up for this patient who is in the emergency room because of heroin withdrawal. Patient was interviewed. Chart reviewed. As of yesterday we had suggested that he be referred to a observation bed. That was denied.  Patient states he is feeling better today. Denies any suicidal thoughts. Patient is lucid. Still having physical symptoms of opiate withdrawal.  Blood pressure has continued to be elevated. Most likely represents primary high blood pressure rather than a symptom of opiate withdrawal.  Patient is not acutely dangerous. Does not meet commitment criteria. Does not require inpatient psychiatric hospitalization.  Recommendation is for discharge from the emergency room with referral to outpatient treatment for substance abuse in the community. Supportive counseling done with the patient. No indication for other psychiatric medicine at this point.  Discharge diagnosis opiate dependence with withdrawal. Case reviewed with emergency room physician.

## 2015-03-24 NOTE — BHH Counselor (Signed)
Informed Pt RN (Iris) that per [facility] RN Aasha Dina Hawks B.) Pt's blood pressure would need to drop before being accepted to [facility] Observational Unit.

## 2015-03-24 NOTE — ED Notes (Signed)
Patient has had behaviors throughout the shift; ranging from disrobing, to soiling himself, to lying in the floor. Patient is redirectable, however continues to intermittently exhibit abnormal behaviors as define above. Patient has stated several times that he is "going to walk out of this bitch". As previously stated, patient is redirectable and will return to his room and literally and purposefully fall face first onto his stretcher. No verbalized needs at this time. Will continue to monitor.

## 2015-03-24 NOTE — ED Notes (Signed)
Spoke with MD about elevated DBP of 118. MD aware of interventions that have been given, however Dr. Dahlia Client does not wish to give any further antihypertensives at this time. MD aware of behaviors throughout the night. Patient has not required intervention because he was redirectable and returned to bed when asked. Patient a little more agitated this morning citing the fact that he wants to go home. MD with order for Ativan 1mg  PO now; advises that it is her suspicion that his agitation is causing his DBP to be elevated. Order to be entered and carried for the Ativan by nursing staff.

## 2015-03-24 NOTE — ED Provider Notes (Signed)
Patient cleared for discharge by Dr. Weber Cooks of psychiatry  Lavonia Drafts, MD 03/24/15 1110

## 2015-03-24 NOTE — ED Notes (Signed)
BEHAVIORAL HEALTH ROUNDING Patient sleeping: No. Patient alert and oriented: Pt alert, but intermittent confusion Behavior appropriate: No.; If no, describe: Pt able to get up to bathroom, but peed on walls during night Nutrition and fluids offered: Yes  Toileting and hygiene offered: Yes  Sitter present: ED Rover Present Law enforcement present: Yes

## 2015-03-24 NOTE — Discharge Instructions (Signed)
Polysubstance Abuse °When people abuse more than one drug or type of drug it is called polysubstance or polydrug abuse. For example, many smokers also drink alcohol. This is one form of polydrug abuse. Polydrug abuse also refers to the use of a drug to counteract an unpleasant effect produced by another drug. It may also be used to help with withdrawal from another drug. People who take stimulants may become agitated. Sometimes this agitation is countered with a tranquilizer. This helps protect against the unpleasant side effects. Polydrug abuse also refers to the use of different drugs at the same time.  °Anytime drug use is interfering with normal living activities, it has become abuse. This includes problems with family and friends. Psychological dependence has developed when your mind tells you that the drug is needed. This is usually followed by physical dependence which has developed when continuing increases of drug are required to get the same feeling or "high". This is known as addiction or chemical dependency. A person's risk is much higher if there is a history of chemical dependency in the family. °SIGNS OF CHEMICAL DEPENDENCY °· You have been told by friends or family that drugs have become a problem. °· You fight when using drugs. °· You are having blackouts (not remembering what you do while using). °· You feel sick from using drugs but continue using. °· You lie about use or amounts of drugs (chemicals) used. °· You need chemicals to get you going. °· You are suffering in work performance or in school because of drug use. °· You get sick from use of drugs but continue to use anyway. °· You need drugs to relate to people or feel comfortable in social situations. °· You use drugs to forget problems. °"Yes" answered to any of the above signs of chemical dependency indicates there are problems. The longer the use of drugs continues, the greater the problems will become. °If there is a family history of  drug or alcohol use, it is best not to experiment with these drugs. Continual use leads to tolerance. After tolerance develops more of the drug is needed to get the same feeling. This is followed by addiction. With addiction, drugs become the most important part of life. It becomes more important to take drugs than participate in the other usual activities of life. This includes relating to friends and family. Addiction is followed by dependency. Dependency is a condition where drugs are now needed not just to get high, but to feel normal. °Addiction cannot be cured but it can be stopped. This often requires outside help and the care of professionals. Treatment centers are listed in the yellow pages under: Cocaine, Narcotics, and Alcoholics Anonymous. Most hospitals and clinics can refer you to a specialized care center. Talk to your caregiver if you need help. °  °This information is not intended to replace advice given to you by your health care provider. Make sure you discuss any questions you have with your health care provider. °  °Document Released: 12/08/2004 Document Revised: 07/11/2011 Document Reviewed: 04/23/2014 °Elsevier Interactive Patient Education ©2016 Elsevier Inc. ° °

## 2015-03-24 NOTE — ED Notes (Signed)
pts sister, Langley Gauss, asked that Dr. Weber Cooks call her at [phone]

## 2015-03-24 NOTE — ED Notes (Signed)
BEHAVIORAL HEALTH ROUNDING Patient sleeping: Yes.   Patient alert and oriented: Pt asleep Behavior appropriate: Yes.  ; If no, describe: Nutrition and fluids offered: No Toileting and hygiene offered: No Sitter present: ED Rover present Law enforcement present: Yes

## 2015-03-24 NOTE — ED Notes (Signed)
BEHAVIORAL HEALTH ROUNDING Patient sleeping: Yes.   Patient alert and oriented: Pt sleeping Behavior appropriate: No.; If no, describe: Pt wakes up and takes clothes off, pt thrashes around in sleep.   Nutrition and fluids offered: No Toileting and hygiene offered: Yes  Sitter present:  Pt in constant view of ED rover Law enforcement present: Yes

## 2015-03-24 NOTE — ED Notes (Signed)
Pt asleep in bed.  Pt has been inappropriate at times, taking off clothes, laying in floor, soiling himself.  Pt cooperative and redirectable when acting inappropriate.  NAD.

## 2015-03-24 NOTE — Progress Notes (Signed)
This Probation officer received call from [place] that pt. had signed voluntary consent form for the observation unit. Reviewed clinical information with Patriciaann Clan P.A.who recommends patient be re-evaluated in the morning, patient has remained hypertensive while in the ED. Concha Norway RN-BC

## 2015-03-24 NOTE — ED Notes (Signed)
Pt masturbating in room with results on floor.

## 2015-03-24 NOTE — Progress Notes (Signed)
TTS has contacted pts. sister Darcey Nora @  [phone], Also can be reached @ [phone]. Pt has been made aware of the pts plans for discharge. Pt has agreed to allow her husband to pick the pt up @ 4pm on today.    03/24/2015 Con Memos, MS, [place], [facility] Therapeutic Triage Specialist

## 2015-03-24 NOTE — ED Provider Notes (Signed)
-----------------------------------------   7:08 AM on 03/24/2015 -----------------------------------------   Blood pressure 149/118, pulse 86, temperature 98.8 F (37.1 C), temperature source Oral, resp. rate 18, height 6' (1.829 m), weight 180 lb (81.647 kg), SpO2 98 %.  The patient had no acute events since last update.  Calm and cooperative at this time.    Initial plan for transfer to [facility] observation unit but denied admission due to patient diastolic blood pressure of 118. We will have the patient reassessed and he will receive clonidine as prescribed for his blood pressure.     Loney Hering, MD 03/24/15 [phone]

## 2015-07-23 DIAGNOSIS — F411 Generalized anxiety disorder: Secondary | ICD-10-CM | POA: Insufficient documentation

## 2015-07-23 DIAGNOSIS — I10 Essential (primary) hypertension: Secondary | ICD-10-CM | POA: Insufficient documentation

## 2015-07-23 DIAGNOSIS — G620 Drug-induced polyneuropathy: Secondary | ICD-10-CM | POA: Insufficient documentation

## 2015-08-11 ENCOUNTER — Encounter: Payer: Self-pay | Admitting: Physical Medicine & Rehabilitation

## 2017-10-04 ENCOUNTER — Encounter: Payer: Self-pay | Admitting: Urology

## 2017-10-04 ENCOUNTER — Ambulatory Visit (INDEPENDENT_AMBULATORY_CARE_PROVIDER_SITE_OTHER): Payer: Medicare Other | Admitting: Urology

## 2017-10-04 VITALS — BP 160/92 | HR 71 | Ht 72.0 in | Wt 193.0 lb

## 2017-10-04 DIAGNOSIS — R8271 Bacteriuria: Secondary | ICD-10-CM | POA: Diagnosis not present

## 2017-10-04 LAB — URINALYSIS, COMPLETE
Bilirubin, UA: NEGATIVE
Glucose, UA: NEGATIVE
Ketones, UA: NEGATIVE
LEUKOCYTES UA: NEGATIVE
NITRITE UA: NEGATIVE
PH UA: 5 (ref 5.0–7.5)
RBC UA: NEGATIVE
Urobilinogen, Ur: 0.2 mg/dL (ref 0.2–1.0)

## 2017-10-04 LAB — BLADDER SCAN AMB NON-IMAGING

## 2017-10-04 NOTE — Progress Notes (Signed)
10/04/2017 9:02 AM   Theodore Obrien 1950/10/16 [id number]  Referring provider: Tracie Harrier, MD [address] [facility] [place], [place] [address]  Chief Complaint  Patient presents with  . Urinary Tract Infection    HPI: Theodore Obrien is a 67 year old male seen at the request of Dr. Ginette Pitman for evaluation of recurrent urinary tract infections.  He had positive urine cultures for staph aureus in September 2017 and March 2019.  He states he was asymptomatic at the time of his diagnosis.  He had no dysuria or flank/abdominal/pelvic pain.  He presently has mild lower urinary tract symptoms with intermittent nocturia x2, intermittent urinary stream and urinary hesitancy.  The symptoms are not bothersome.  Denies gross hematuria.  Denies prior history of urologic problems or prior urologic evaluation.  A PSA drawn March 2019 was 0.45.   PMH: Past Medical History:  Diagnosis Date  . GERD (gastroesophageal reflux disease)   . Hypertension     Surgical History: Past Surgical History:  Procedure Laterality Date  . CARPAL TUNNEL RELEASE Left   . FEMORAL HERNIA REPAIR Left 10/27/2014   Procedure: Repair of left femoral and direct hernia with mesh ;  Surgeon: Sherri Rad, MD;  Location: [facility] ORS;  Service: General;  Laterality: Left;  . FRACTURE SURGERY     right leg    Home Medications:  Allergies as of 10/04/2017      Reactions   Zyvox [linezolid]    Nerve damage       Medication List        Accurate as of 10/04/17  9:02 AM. Always use your most recent med list.          ALPRAZolam 0.25 MG tablet Commonly known as:  XANAX Take by mouth.   carvedilol 3.125 MG tablet Commonly known as:  COREG Take by mouth.   cloNIDine 0.1 MG tablet Commonly known as:  CATAPRES TAKE 1 TABLET BY MOUTH EVERY 8 HOURS   JUBLIA 10 % Soln Generic drug:  Efinaconazole Apply topically.   PROVENTIL HFA 108 (90 Base) MCG/ACT inhaler Generic drug:  albuterol Inhale into the  lungs.       Allergies:  Allergies  Allergen Reactions  . Zyvox [Linezolid]     Nerve damage     Family History: Family History  Problem Relation Age of Onset  . Hypertension Mother   . Heart disease Mother   . Prostate cancer Father   . Leukemia Father     Social History:  reports that he has been smoking cigarettes.  He has a 5.00 pack-year smoking history. He has never used smokeless tobacco. He reports that he drinks alcohol. He reports that he does not use drugs.  ROS: UROLOGY Frequent Urination?: No Hard to postpone urination?: Yes Burning/pain with urination?: No Get up at night to urinate?: Yes Leakage of urine?: Yes Urine stream starts and stops?: Yes Trouble starting stream?: Yes Do you have to strain to urinate?: Yes Blood in urine?: Yes Urinary tract infection?: Yes Sexually transmitted disease?: No Injury to kidneys or bladder?: No Painful intercourse?: No Weak stream?: No Erection problems?: Yes Penile pain?: No  Gastrointestinal Nausea?: No Vomiting?: No Indigestion/heartburn?: No Diarrhea?: No Constipation?: No  Constitutional Fever: No Night sweats?: No Weight loss?: No Fatigue?: No  Skin Skin rash/lesions?: No Itching?: No  Eyes Blurred vision?: No Double vision?: No  Ears/Nose/Throat Sore throat?: No Sinus problems?: Yes  Hematologic/Lymphatic Swollen glands?: No Easy bruising?: No  Cardiovascular Leg swelling?: No  Chest pain?: No  Respiratory Cough?: No Shortness of breath?: Yes  Endocrine Excessive thirst?: No  Musculoskeletal Back pain?: No Joint pain?: No  Neurological Headaches?: No Dizziness?: No  Psychologic Depression?: No Anxiety?: No  Physical Exam: BP (!) 160/92 (BP Location: Left Arm, Patient Position: Sitting, Cuff Size: Large)   Pulse 71   Ht 6' (1.829 m)   Wt 193 lb (87.5 kg)   SpO2 99%   BMI 26.18 kg/m   Constitutional:  Alert and oriented, No acute distress. HEENT: [place] AT, moist  mucus membranes.  Trachea midline, no masses. Cardiovascular: No clubbing, cyanosis, or edema. Respiratory: Normal respiratory effort, no increased work of breathing. GI: Abdomen is soft, nontender, nondistended, no abdominal masses GU: No CVA tenderness.  Phallus circumcised without lesions.  Testes descended bilaterally without masses or tenderness, no paratesticular abnormalities.  Prostate small 30 g, smooth without nodules. Lymph: No cervical or inguinal lymphadenopathy. Skin: No rashes, bruises or suspicious lesions. Neurologic: Grossly intact, no focal deficits, moving all 4 extremities. Psychiatric: Normal mood and affect.  Laboratory Data:  Assessment & Plan:   He was initially unable to give a urine specimen and bladder scan for estimated volume was 14 mL.  He has asymptomatic bacteriuria.  Have recommended a screening renal ultrasound to evaluate for any anatomic abnormalities.  He will be notified with results and further recommendations.   Abbie Sons, [place] [address] [place], [place] [address] [phone]

## 2017-10-24 ENCOUNTER — Ambulatory Visit
Admission: RE | Admit: 2017-10-24 | Discharge: 2017-10-24 | Disposition: A | Discharge status: Home / Self Care | Payer: Medicare Other | Source: Ambulatory Visit | Attending: Urology | Admitting: Urology

## 2017-10-24 DIAGNOSIS — N200 Calculus of kidney: Secondary | ICD-10-CM | POA: Diagnosis not present

## 2017-10-24 DIAGNOSIS — R8271 Bacteriuria: Secondary | ICD-10-CM | POA: Diagnosis present

## 2017-10-24 DIAGNOSIS — N281 Cyst of kidney, acquired: Secondary | ICD-10-CM | POA: Insufficient documentation

## 2017-10-25 ENCOUNTER — Telehealth: Payer: Self-pay

## 2017-10-25 DIAGNOSIS — N200 Calculus of kidney: Secondary | ICD-10-CM

## 2017-10-25 DIAGNOSIS — R8271 Bacteriuria: Secondary | ICD-10-CM

## 2017-10-25 NOTE — Telephone Encounter (Signed)
-----   Message from Abbie Sons, MD sent at 10/25/2017  8:02 AM EDT ----- Renal ultrasound does show bilateral, nonobstructing stones.  These are unlikely related to infections.  Recommend a follow-up with KUB in 3 months.

## 2017-10-25 NOTE — Telephone Encounter (Signed)
Patient notified of results, please schedule follow up thanks

## 2017-10-26 NOTE — Telephone Encounter (Signed)
App made and mailed to patient An order for the KUB needs to be done and signed   Thanks, Sharyn Lull

## 2017-10-31 NOTE — Telephone Encounter (Signed)
Order placed

## 2018-01-26 ENCOUNTER — Encounter: Payer: Self-pay | Admitting: Urology

## 2018-01-26 ENCOUNTER — Ambulatory Visit
Admission: RE | Admit: 2018-01-26 | Discharge: 2018-01-26 | Disposition: A | Discharge status: Home / Self Care | Payer: Medicare Other | Source: Ambulatory Visit | Attending: Urology | Admitting: Urology

## 2018-01-26 ENCOUNTER — Ambulatory Visit (INDEPENDENT_AMBULATORY_CARE_PROVIDER_SITE_OTHER): Payer: Medicare Other | Admitting: Urology

## 2018-01-26 VITALS — BP 148/92 | HR 70 | Ht 73.0 in | Wt 188.4 lb

## 2018-01-26 DIAGNOSIS — N200 Calculus of kidney: Secondary | ICD-10-CM

## 2018-01-26 DIAGNOSIS — N5201 Erectile dysfunction due to arterial insufficiency: Secondary | ICD-10-CM

## 2018-01-26 NOTE — Progress Notes (Signed)
01/26/2018 2:47 PM   [name] [birth year] [id number]  Referring provider: Tracie Harrier, MD [address] [facility] [place], [place] [address]  Chief Complaint  Patient presents with  . Follow-up    HPI: 67 year old male who was initially seen in June 2019 for recurrent urinary tract infections.  He actually had asymptomatic bacteriuria.  He had mild lower urinary tract symptoms which were not bothersome.  His urine grew staph aureus x2.  A renal ultrasound was performed which showed a 5 mm lower pole nonobstructing stone a 5 mm nonobstructing left middle calyceal stone and a 6 mm left upper pole cyst.  The bladder showed no significant abnormalities. Since his last visit he denies bothersome lower urinary tract symptoms, dysuria or hematuria.  KUB performed today was reviewed and there is a significant amount of stool and bowel gas obscuring the renal outlines.  There is a calcification overlying the lower portion of the left renal outline.  He also notes difficulty achieving and maintaining an erection.  He is currently not sexually active.  He has never tried PDE 5 inhibitors.  PMH: Past Medical History:  Diagnosis Date  . GERD (gastroesophageal reflux disease)   . Hypertension     Surgical History: Past Surgical History:  Procedure Laterality Date  . CARPAL TUNNEL RELEASE Left   . FEMORAL HERNIA REPAIR Left 10/27/2014   Procedure: Repair of left femoral and direct hernia with mesh ;  Surgeon: Sherri Rad, MD;  Location: [facility] ORS;  Service: General;  Laterality: Left;  . FRACTURE SURGERY     right leg    Home Medications:  Allergies as of 01/26/2018      Reactions   Zyvox [linezolid]    Nerve damage       Medication List        Accurate as of 01/26/18  2:47 PM. Always use your most recent med list.          ALPRAZolam 0.25 MG tablet Commonly known as:  XANAX Take by mouth.   carvedilol 3.125 MG tablet Commonly known as:  COREG Take  by mouth.   cloNIDine 0.1 MG tablet Commonly known as:  CATAPRES TAKE 1 TABLET BY MOUTH EVERY 8 HOURS   PROVENTIL HFA 108 (90 Base) MCG/ACT inhaler Generic drug:  albuterol Inhale into the lungs.       Allergies:  Allergies  Allergen Reactions  . Zyvox [Linezolid]     Nerve damage     Family History: Family History  Problem Relation Age of Onset  . Hypertension Mother   . Heart disease Mother   . Prostate cancer Father   . Leukemia Father     Social History:  reports that he quit smoking about 20 months ago. His smoking use included cigarettes. He has a 6.00 pack-year smoking history. He has never used smokeless tobacco. He reports that he drinks alcohol. He reports that he does not use drugs.  ROS: UROLOGY Frequent Urination?: No Hard to postpone urination?: Yes Burning/pain with urination?: No Get up at night to urinate?: Yes Leakage of urine?: Yes Urine stream starts and stops?: Yes Trouble starting stream?: Yes Do you have to strain to urinate?: No Blood in urine?: No Urinary tract infection?: No Sexually transmitted disease?: No Injury to kidneys or bladder?: No Painful intercourse?: No Weak stream?: No Erection problems?: Yes Penile pain?: No  Gastrointestinal Nausea?: No Indigestion/heartburn?: No Diarrhea?: No Constipation?: No  Constitutional Fever: No Night sweats?: Yes Weight loss?: No Fatigue?:  No  Skin Skin rash/lesions?: No Itching?: No  Eyes Blurred vision?: No Double vision?: No  Ears/Nose/Throat Sore throat?: No Sinus problems?: Yes  Hematologic/Lymphatic Swollen glands?: No Easy bruising?: No  Cardiovascular Leg swelling?: No Chest pain?: No  Respiratory Cough?: No Shortness of breath?: Yes  Endocrine Excessive thirst?: No  Musculoskeletal Back pain?: No Joint pain?: Yes  Neurological Headaches?: No Dizziness?: No  Psychologic Depression?: No Anxiety?: Yes  Physical Exam: BP (!) 148/92 (BP  Location: Left Arm, Patient Position: Sitting, Cuff Size: Normal)   Pulse 70   Ht 6\' 1"  (1.854 m)   Wt 188 lb 6.4 oz (85.5 kg)   BMI 24.86 kg/m   Constitutional:  Alert and oriented, No acute distress. HEENT: [place] AT, moist mucus membranes.  Trachea midline, no masses. Cardiovascular: No clubbing, cyanosis, or edema. Respiratory: Normal respiratory effort, no increased work of breathing. GI: Abdomen is soft, nontender, nondistended, no abdominal masses GU: No CVA tenderness Lymph: No cervical or inguinal lymphadenopathy. Skin: No rashes, bruises or suspicious lesions. Neurologic: Grossly intact, no focal deficits, moving all 4 extremities. Psychiatric: Normal mood and affect.  Laboratory Data:  Urinalysis Urinalysis performed this morning at [facility] clinic showed 0-3 WBC/0-3 RBC.   Pertinent Imaging: KUB was personally reviewed  Assessment & Plan:   67 year old male with nonobstructing bilateral renal calculi.  He is asymptomatic.  Urinalysis today was clear.  He also complained of erectile dysfunction today although does not desire any treatment at this time.  Would initially recommend a PDE 5 inhibitor.  We discussed potential side effects.  He can call back for prescription if he desires a trial.  Follow-up 6 months with a KUB.   Abbie Sons, [place] [address] [place], [place] [address] [phone]

## 2018-01-29 ENCOUNTER — Encounter: Payer: Self-pay | Admitting: Urology

## 2018-05-02 DIAGNOSIS — I219 Acute myocardial infarction, unspecified: Secondary | ICD-10-CM

## 2018-05-02 HISTORY — DX: Acute myocardial infarction, unspecified: I21.9

## 2018-07-30 ENCOUNTER — Ambulatory Visit: Payer: Self-pay | Admitting: Urology

## 2018-08-28 ENCOUNTER — Ambulatory Visit: Payer: Medicare Other | Admitting: Urology

## 2018-12-27 ENCOUNTER — Other Ambulatory Visit: Payer: Self-pay

## 2018-12-27 ENCOUNTER — Inpatient Hospital Stay
Admission: EM | Admit: 2018-12-27 | Discharge: 2019-01-01 | DRG: 246 | Disposition: A | Discharge status: Home / Self Care | Payer: Medicare Other | Attending: Internal Medicine | Admitting: Internal Medicine

## 2018-12-27 ENCOUNTER — Encounter: Payer: Self-pay | Admitting: Emergency Medicine

## 2018-12-27 ENCOUNTER — Emergency Department: Payer: Medicare Other

## 2018-12-27 DIAGNOSIS — F419 Anxiety disorder, unspecified: Secondary | ICD-10-CM | POA: Diagnosis present

## 2018-12-27 DIAGNOSIS — K219 Gastro-esophageal reflux disease without esophagitis: Secondary | ICD-10-CM | POA: Diagnosis present

## 2018-12-27 DIAGNOSIS — Z87440 Personal history of urinary (tract) infections: Secondary | ICD-10-CM

## 2018-12-27 DIAGNOSIS — R739 Hyperglycemia, unspecified: Secondary | ICD-10-CM | POA: Diagnosis present

## 2018-12-27 DIAGNOSIS — F17210 Nicotine dependence, cigarettes, uncomplicated: Secondary | ICD-10-CM | POA: Diagnosis present

## 2018-12-27 DIAGNOSIS — I214 Non-ST elevation (NSTEMI) myocardial infarction: Principal | ICD-10-CM | POA: Diagnosis present

## 2018-12-27 DIAGNOSIS — Z888 Allergy status to other drugs, medicaments and biological substances status: Secondary | ICD-10-CM

## 2018-12-27 DIAGNOSIS — I452 Bifascicular block: Secondary | ICD-10-CM | POA: Diagnosis present

## 2018-12-27 DIAGNOSIS — Z8249 Family history of ischemic heart disease and other diseases of the circulatory system: Secondary | ICD-10-CM | POA: Diagnosis not present

## 2018-12-27 DIAGNOSIS — J440 Chronic obstructive pulmonary disease with acute lower respiratory infection: Secondary | ICD-10-CM | POA: Diagnosis present

## 2018-12-27 DIAGNOSIS — E872 Acidosis: Secondary | ICD-10-CM | POA: Diagnosis present

## 2018-12-27 DIAGNOSIS — Z79899 Other long term (current) drug therapy: Secondary | ICD-10-CM | POA: Diagnosis not present

## 2018-12-27 DIAGNOSIS — F10239 Alcohol dependence with withdrawal, unspecified: Secondary | ICD-10-CM | POA: Diagnosis present

## 2018-12-27 DIAGNOSIS — J9602 Acute respiratory failure with hypercapnia: Secondary | ICD-10-CM | POA: Diagnosis present

## 2018-12-27 DIAGNOSIS — J441 Chronic obstructive pulmonary disease with (acute) exacerbation: Secondary | ICD-10-CM | POA: Diagnosis present

## 2018-12-27 DIAGNOSIS — I5021 Acute systolic (congestive) heart failure: Secondary | ICD-10-CM | POA: Diagnosis present

## 2018-12-27 DIAGNOSIS — I110 Hypertensive heart disease with heart failure: Secondary | ICD-10-CM | POA: Diagnosis present

## 2018-12-27 DIAGNOSIS — R0603 Acute respiratory distress: Secondary | ICD-10-CM | POA: Diagnosis present

## 2018-12-27 DIAGNOSIS — J209 Acute bronchitis, unspecified: Secondary | ICD-10-CM | POA: Diagnosis present

## 2018-12-27 DIAGNOSIS — Z20828 Contact with and (suspected) exposure to other viral communicable diseases: Secondary | ICD-10-CM | POA: Diagnosis present

## 2018-12-27 DIAGNOSIS — F1410 Cocaine abuse, uncomplicated: Secondary | ICD-10-CM | POA: Diagnosis present

## 2018-12-27 DIAGNOSIS — N179 Acute kidney failure, unspecified: Secondary | ICD-10-CM | POA: Diagnosis present

## 2018-12-27 DIAGNOSIS — J9601 Acute respiratory failure with hypoxia: Secondary | ICD-10-CM | POA: Diagnosis present

## 2018-12-27 DIAGNOSIS — R0602 Shortness of breath: Secondary | ICD-10-CM | POA: Diagnosis not present

## 2018-12-27 LAB — COMPREHENSIVE METABOLIC PANEL
ALT: 54 U/L — ABNORMAL HIGH (ref 0–44)
AST: 84 U/L — ABNORMAL HIGH (ref 15–41)
Albumin: 3.8 g/dL (ref 3.5–5.0)
Alkaline Phosphatase: 85 U/L (ref 38–126)
Anion gap: 15 (ref 5–15)
BUN: 17 mg/dL (ref 8–23)
CO2: 21 mmol/L — ABNORMAL LOW (ref 22–32)
Calcium: 8.7 mg/dL — ABNORMAL LOW (ref 8.9–10.3)
Chloride: 102 mmol/L (ref 98–111)
Creatinine, Ser: 1.05 mg/dL (ref 0.61–1.24)
GFR calc Af Amer: >60 mL/min (ref >60)
GFR calc non Af Amer: >60 mL/min (ref >60)
Glucose, Bld: 268 mg/dL — ABNORMAL HIGH (ref 70–99)
Potassium: 4.7 mmol/L (ref 3.5–5.1)
Sodium: 138 mmol/L (ref 135–145)
Total Bilirubin: 1.6 mg/dL — ABNORMAL HIGH (ref 0.3–1.2)
Total Protein: 6.8 g/dL (ref 6.5–8.1)

## 2018-12-27 LAB — CBC WITH DIFFERENTIAL/PLATELET
Abs Immature Granulocytes: 0.05 10*3/uL (ref 0.00–0.07)
Basophils Absolute: 0.1 10*3/uL (ref 0.0–0.1)
Basophils Relative: 1 %
Eosinophils Absolute: 0.5 10*3/uL (ref 0.0–0.5)
Eosinophils Relative: 4 %
HCT: 52.4 % — ABNORMAL HIGH (ref 39.0–52.0)
Hemoglobin: 16.7 g/dL (ref 13.0–17.0)
Immature Granulocytes: 0 %
Lymphocytes Relative: 32 %
Lymphs Abs: 4.1 10*3/uL — ABNORMAL HIGH (ref 0.7–4.0)
MCH: 28.7 pg (ref 26.0–34.0)
MCHC: 31.9 g/dL (ref 30.0–36.0)
MCV: 90.2 fL (ref 80.0–100.0)
Monocytes Absolute: 1.3 10*3/uL — ABNORMAL HIGH (ref 0.1–1.0)
Monocytes Relative: 10 %
Neutro Abs: 7 10*3/uL (ref 1.7–7.7)
Neutrophils Relative %: 53 %
Platelets: 239 10*3/uL (ref 150–400)
RBC: 5.81 MIL/uL (ref 4.22–5.81)
RDW: 13 % (ref 11.5–15.5)
WBC: 13 10*3/uL — ABNORMAL HIGH (ref 4.0–10.5)
nRBC: 0 % (ref 0.0–0.2)

## 2018-12-27 LAB — BLOOD GAS, VENOUS
Acid-base deficit: 6 mmol/L — ABNORMAL HIGH (ref 0.0–2.0)
Patient temperature: 37
pCO2, Ven: 67 mmHg — ABNORMAL HIGH (ref 44.0–60.0)
pH, Ven: 7.16 — CL (ref 7.250–7.430)
pO2, Ven: 75 mmHg — ABNORMAL HIGH (ref 32.0–45.0)

## 2018-12-27 LAB — SARS CORONAVIRUS 2 BY RT PCR (HOSPITAL ORDER, PERFORMED IN ~~LOC~~ HOSPITAL LAB): SARS Coronavirus 2: NEGATIVE

## 2018-12-27 LAB — MRSA PCR SCREENING: MRSA by PCR: NEGATIVE

## 2018-12-27 LAB — URINE DRUG SCREEN, QUALITATIVE (ARMC ONLY)
Amphetamines, Ur Screen: NOT DETECTED
Barbiturates, Ur Screen: NOT DETECTED
Benzodiazepine, Ur Scrn: NOT DETECTED
Cannabinoid 50 Ng, Ur ~~LOC~~: NOT DETECTED
Cocaine Metabolite,Ur ~~LOC~~: NOT DETECTED
MDMA (Ecstasy)Ur Screen: NOT DETECTED
Methadone Scn, Ur: NOT DETECTED
Opiate, Ur Screen: POSITIVE — AB
Phencyclidine (PCP) Ur S: NOT DETECTED
Tricyclic, Ur Screen: NOT DETECTED

## 2018-12-27 LAB — GLUCOSE, CAPILLARY
Glucose-Capillary: 162 mg/dL — ABNORMAL HIGH (ref 70–99)
Glucose-Capillary: 180 mg/dL — ABNORMAL HIGH (ref 70–99)
Glucose-Capillary: 149 mg/dL — ABNORMAL HIGH (ref 70–99)

## 2018-12-27 LAB — ETHANOL: Alcohol, Ethyl (B): <10 mg/dL (ref <10)

## 2018-12-27 LAB — PROTIME-INR
INR: 1 (ref 0.8–1.2)
Prothrombin Time: 13.5 seconds (ref 11.4–15.2)

## 2018-12-27 LAB — BRAIN NATRIURETIC PEPTIDE: B Natriuretic Peptide: 133 pg/mL — ABNORMAL HIGH (ref 0.0–100.0)

## 2018-12-27 LAB — APTT: aPTT: 28 seconds (ref 24–36)

## 2018-12-27 LAB — LACTIC ACID, PLASMA
Lactic Acid, Venous: 5.3 mmol/L (ref 0.5–1.9)
Lactic Acid, Venous: 3.1 mmol/L (ref 0.5–1.9)

## 2018-12-27 LAB — HEMOGLOBIN A1C
Hgb A1c MFr Bld: 5.9 % — ABNORMAL HIGH (ref 4.8–5.6)
Mean Plasma Glucose: 122.63 mg/dL

## 2018-12-27 LAB — PROCALCITONIN: Procalcitonin: 0.12 ng/mL

## 2018-12-27 LAB — TROPONIN I (HIGH SENSITIVITY)
Troponin I (High Sensitivity): 22 ng/L — ABNORMAL HIGH (ref <18)
Troponin I (High Sensitivity): 92 ng/L — ABNORMAL HIGH (ref <18)
Troponin I (High Sensitivity): 127 ng/L (ref <18)
Troponin I (High Sensitivity): 174 ng/L (ref <18)

## 2018-12-27 LAB — SALICYLATE LEVEL: Salicylate Lvl: <7 mg/dL (ref 2.8–30.0)

## 2018-12-27 MED ORDER — SODIUM CHLORIDE 0.9 % IV SOLN
1.0000 g | INTRAVENOUS | Status: DC
  Administered 2018-12-28 – 2019-01-01 (×5): 1 g via INTRAVENOUS
  Filled 2018-12-27 (×5): qty 1

## 2018-12-27 MED ORDER — ONDANSETRON HCL 4 MG/2ML IJ SOLN
INTRAMUSCULAR | Status: AC
  Filled 2018-12-27: qty 2

## 2018-12-27 MED ORDER — LORAZEPAM 1 MG PO TABS: 1.0000 mg | ORAL_TABLET | Freq: Four times a day (QID) | ORAL | Status: DC | PRN

## 2018-12-27 MED ORDER — SODIUM CHLORIDE 0.9 % IV SOLN
1.0000 g | Freq: Once | INTRAVENOUS | Status: AC
  Administered 2018-12-27: 10:00:00 1 g via INTRAVENOUS
  Filled 2018-12-27: qty 10

## 2018-12-27 MED ORDER — LORAZEPAM 2 MG/ML IJ SOLN
2.0000 mg | INTRAMUSCULAR | Status: DC | PRN
  Administered 2018-12-27 – 2018-12-29 (×4): 2 mg via INTRAVENOUS
  Administered 2018-12-29: 12:00:00 3 mg via INTRAVENOUS
  Administered 2018-12-30 – 2018-12-31 (×5): 2 mg via INTRAVENOUS
  Filled 2018-12-27 (×8): qty 1
  Filled 2018-12-27: qty 2
  Filled 2018-12-27 (×2): qty 1

## 2018-12-27 MED ORDER — SODIUM CHLORIDE 0.9 % IV SOLN
INTRAVENOUS | Status: DC
  Administered 2018-12-27: 75 mL/h via INTRAVENOUS

## 2018-12-27 MED ORDER — DEXMEDETOMIDINE HCL IN NACL 400 MCG/100ML IV SOLN
0.4000 ug/kg/h | INTRAVENOUS | Status: DC
  Administered 2018-12-27 – 2018-12-28 (×3): 0.6 ug/kg/h via INTRAVENOUS
  Administered 2018-12-28: 07:00:00 0.8 ug/kg/h via INTRAVENOUS
  Administered 2018-12-28: 03:00:00 1.2 ug/kg/h via INTRAVENOUS
  Administered 2018-12-29: 05:00:00 0.4 ug/kg/h via INTRAVENOUS
  Filled 2018-12-27 (×5): qty 100

## 2018-12-27 MED ORDER — METHYLPREDNISOLONE SODIUM SUCC 40 MG IJ SOLR
40.0000 mg | Freq: Two times a day (BID) | INTRAMUSCULAR | Status: DC
  Administered 2018-12-27 – 2018-12-31 (×8): 40 mg via INTRAVENOUS
  Filled 2018-12-27 (×8): qty 1

## 2018-12-27 MED ORDER — INSULIN ASPART 100 UNIT/ML ~~LOC~~ SOLN
0.0000 [IU] | Freq: Three times a day (TID) | SUBCUTANEOUS | Status: DC
  Administered 2018-12-27: 16:00:00 3 [IU] via SUBCUTANEOUS
  Administered 2018-12-28: 09:00:00 5 [IU] via SUBCUTANEOUS
  Administered 2018-12-28 – 2018-12-29 (×4): 3 [IU] via SUBCUTANEOUS
  Administered 2018-12-30: 5 [IU] via SUBCUTANEOUS
  Administered 2018-12-30: 2 [IU] via SUBCUTANEOUS
  Administered 2018-12-30: 3 [IU] via SUBCUTANEOUS
  Administered 2018-12-31: 19:00:00 2 [IU] via SUBCUTANEOUS
  Filled 2018-12-27 (×8): qty 1

## 2018-12-27 MED ORDER — LORAZEPAM 2 MG/ML IJ SOLN
INTRAMUSCULAR | Status: AC
  Filled 2018-12-27: qty 1

## 2018-12-27 MED ORDER — NICOTINE 14 MG/24HR TD PT24
14.0000 mg | MEDICATED_PATCH | Freq: Every day | TRANSDERMAL | Status: DC
  Administered 2018-12-27 – 2019-01-01 (×6): 14 mg via TRANSDERMAL
  Filled 2018-12-27 (×6): qty 1

## 2018-12-27 MED ORDER — CHLORHEXIDINE GLUCONATE CLOTH 2 % EX PADS
6.0000 | MEDICATED_PAD | Freq: Every day | CUTANEOUS | Status: DC
  Administered 2018-12-27 – 2018-12-29 (×3): 6 via TOPICAL

## 2018-12-27 MED ORDER — IPRATROPIUM-ALBUTEROL 0.5-2.5 (3) MG/3ML IN SOLN
RESPIRATORY_TRACT | Status: AC
  Administered 2018-12-27: 3 mL via RESPIRATORY_TRACT
  Filled 2018-12-27: qty 6

## 2018-12-27 MED ORDER — BUDESONIDE 0.5 MG/2ML IN SUSP
0.5000 mg | Freq: Two times a day (BID) | RESPIRATORY_TRACT | Status: DC
  Administered 2018-12-27 – 2019-01-01 (×9): 0.5 mg via RESPIRATORY_TRACT
  Filled 2018-12-27 (×10): qty 2

## 2018-12-27 MED ORDER — ADULT MULTIVITAMIN W/MINERALS CH
1.0000 | ORAL_TABLET | Freq: Every day | ORAL | Status: DC
  Administered 2018-12-27 – 2019-01-01 (×6): 1 via ORAL
  Filled 2018-12-27 (×6): qty 1

## 2018-12-27 MED ORDER — METHYLPREDNISOLONE SODIUM SUCC 125 MG IJ SOLR
60.0000 mg | Freq: Four times a day (QID) | INTRAMUSCULAR | Status: DC
  Filled 2018-12-27: qty 2

## 2018-12-27 MED ORDER — ALBUTEROL SULFATE (2.5 MG/3ML) 0.083% IN NEBU
5.0000 mg | INHALATION_SOLUTION | Freq: Once | RESPIRATORY_TRACT | Status: AC
  Administered 2018-12-27: 11:00:00 5 mg via RESPIRATORY_TRACT
  Filled 2018-12-27: qty 6

## 2018-12-27 MED ORDER — SODIUM CHLORIDE 0.9 % IV SOLN
500.0000 mg | Freq: Once | INTRAVENOUS | Status: AC
  Administered 2018-12-27: 10:00:00 500 mg via INTRAVENOUS
  Filled 2018-12-27: qty 500

## 2018-12-27 MED ORDER — ORAL CARE MOUTH RINSE
15.0000 mL | Freq: Two times a day (BID) | OROMUCOSAL | Status: DC
  Administered 2018-12-27 – 2018-12-30 (×5): 15 mL via OROMUCOSAL

## 2018-12-27 MED ORDER — ENOXAPARIN SODIUM 40 MG/0.4ML ~~LOC~~ SOLN
40.0000 mg | SUBCUTANEOUS | Status: DC
  Administered 2018-12-27 – 2018-12-28 (×2): 40 mg via SUBCUTANEOUS
  Filled 2018-12-27 (×2): qty 0.4

## 2018-12-27 MED ORDER — NITROGLYCERIN IN D5W 200-5 MCG/ML-% IV SOLN: 0.0000 ug/min | INTRAVENOUS | Status: DC

## 2018-12-27 MED ORDER — ONDANSETRON HCL 4 MG PO TABS: 4.0000 mg | ORAL_TABLET | Freq: Four times a day (QID) | ORAL | Status: DC | PRN

## 2018-12-27 MED ORDER — ONDANSETRON HCL 4 MG/2ML IJ SOLN: 4.0000 mg | Freq: Four times a day (QID) | INTRAMUSCULAR | Status: DC | PRN

## 2018-12-27 MED ORDER — SODIUM CHLORIDE 0.9 % IV BOLUS
1000.0000 mL | Freq: Once | INTRAVENOUS | Status: AC
  Administered 2018-12-27: 1000 mL via INTRAVENOUS

## 2018-12-27 MED ORDER — NITROGLYCERIN IN D5W 200-5 MCG/ML-% IV SOLN
INTRAVENOUS | Status: AC
  Filled 2018-12-27: qty 250

## 2018-12-27 MED ORDER — LORAZEPAM 2 MG/ML IJ SOLN
1.0000 mg | Freq: Four times a day (QID) | INTRAMUSCULAR | Status: DC | PRN
  Administered 2018-12-27: 1 mg via INTRAVENOUS
  Filled 2018-12-27: qty 1

## 2018-12-27 MED ORDER — INSULIN ASPART 100 UNIT/ML ~~LOC~~ SOLN
0.0000 [IU] | Freq: Every day | SUBCUTANEOUS | Status: DC
  Administered 2018-12-30 – 2018-12-31 (×2): 2 [IU] via SUBCUTANEOUS
  Filled 2018-12-27 (×2): qty 1

## 2018-12-27 MED ORDER — PANTOPRAZOLE SODIUM 40 MG IV SOLR
40.0000 mg | INTRAVENOUS | Status: DC
  Administered 2018-12-27 – 2018-12-28 (×2): 40 mg via INTRAVENOUS
  Filled 2018-12-27 (×2): qty 40

## 2018-12-27 MED ORDER — VITAMIN B-1 100 MG PO TABS
100.0000 mg | ORAL_TABLET | Freq: Every day | ORAL | Status: DC
  Administered 2018-12-27 – 2019-01-01 (×6): 100 mg via ORAL
  Filled 2018-12-27 (×7): qty 1

## 2018-12-27 MED ORDER — AZITHROMYCIN 250 MG PO TABS
500.0000 mg | ORAL_TABLET | Freq: Every day | ORAL | Status: AC
  Administered 2018-12-28 – 2018-12-30 (×3): 500 mg via ORAL
  Filled 2018-12-27: qty 1
  Filled 2018-12-27: qty 2
  Filled 2018-12-27: qty 1
  Filled 2018-12-27: qty 2

## 2018-12-27 MED ORDER — ONDANSETRON HCL 4 MG/2ML IJ SOLN
4.0000 mg | Freq: Once | INTRAMUSCULAR | Status: AC
  Administered 2018-12-27: 11:00:00 4 mg via INTRAVENOUS

## 2018-12-27 MED ORDER — FOLIC ACID 1 MG PO TABS
1.0000 mg | ORAL_TABLET | Freq: Every day | ORAL | Status: DC
  Administered 2018-12-27 – 2019-01-01 (×6): 1 mg via ORAL
  Filled 2018-12-27 (×6): qty 1

## 2018-12-27 MED ORDER — IPRATROPIUM-ALBUTEROL 0.5-2.5 (3) MG/3ML IN SOLN
3.0000 mL | Freq: Four times a day (QID) | RESPIRATORY_TRACT | Status: DC
  Administered 2018-12-27 – 2018-12-30 (×13): 3 mL via RESPIRATORY_TRACT
  Filled 2018-12-27 (×12): qty 3

## 2018-12-27 MED ORDER — LORAZEPAM 2 MG/ML IJ SOLN
1.0000 mg | Freq: Once | INTRAMUSCULAR | Status: AC
  Administered 2018-12-27: 17:00:00 1 mg via INTRAVENOUS

## 2018-12-27 MED ORDER — AZITHROMYCIN 500 MG PO TABS
1000.0000 mg | ORAL_TABLET | Freq: Once | ORAL | Status: DC
  Filled 2018-12-27: qty 2

## 2018-12-27 MED ORDER — FUROSEMIDE 10 MG/ML IJ SOLN
20.0000 mg | Freq: Once | INTRAMUSCULAR | Status: AC
  Administered 2018-12-27: 14:00:00 20 mg via INTRAVENOUS
  Filled 2018-12-27: qty 2

## 2018-12-27 MED ORDER — ALBUTEROL SULFATE (2.5 MG/3ML) 0.083% IN NEBU: 2.5000 mg | INHALATION_SOLUTION | RESPIRATORY_TRACT | Status: DC | PRN

## 2018-12-27 MED ORDER — IPRATROPIUM-ALBUTEROL 0.5-2.5 (3) MG/3ML IN SOLN
3.0000 mL | Freq: Once | RESPIRATORY_TRACT | Status: AC
  Administered 2018-12-27: 09:00:00 3 mL via RESPIRATORY_TRACT

## 2018-12-27 NOTE — ED Notes (Signed)
ED TO INPATIENT HANDOFF REPORT  ED Nurse Name and Phone #:  Mickel Baas [phone] Name/Age/Gender Theodore Obrien 68 y.o. male Room/Bed: ED01A/ED01A  Code Status   Code Status: Not on file  Home/SNF/Other Home Patient oriented to: self, place, time and situation Is this baseline? Yes   Triage Complete: Triage complete  Chief Complaint breathing difficulty  Triage Note Patient from home via [facility]. Patient reports worsening SOB. Patient arrives getting breathing tx from EMS. Diminished lung sounds bilaterally. Patient denies respiratory history. Denies recent cough or fever. Denies contact with anyone sick. EDP and RT at bedside. Patient placed on bipap. Patient given 1 duoneb and 125 mg solumedrol by EMS PTA.    Allergies Allergies  Allergen Reactions  . Zyvox [Linezolid]     Nerve damage     Level of Care/Admitting Diagnosis ED Disposition    ED Disposition Condition Comment   Admit  The patient appears reasonably stabilized for admission considering the current resources, flow, and capabilities available in the ED at this time, and I doubt any other [facility] requiring further screening and/or treatment in the ED prior to admission is  present.       B Medical/Surgery History Past Medical History:  Diagnosis Date  . GERD (gastroesophageal reflux disease)   . Hypertension    Past Surgical History:  Procedure Laterality Date  . CARPAL TUNNEL RELEASE Left   . FEMORAL HERNIA REPAIR Left 10/27/2014   Procedure: Repair of left femoral and direct hernia with mesh ;  Surgeon: Sherri Rad, MD;  Location: [facility] ORS;  Service: General;  Laterality: Left;  . FRACTURE SURGERY     right leg     A IV Location/Drains/Wounds Patient Lines/Drains/Airways Status   Active Line/Drains/Airways    Name:   Placement date:   Placement time:   Site:   Days:   Peripheral IV 12/27/18 Right Forearm   12/27/18    0847    Forearm   less than 1   Peripheral IV 12/27/18 Left Forearm   12/27/18    0847     Forearm   less than 1   Incision (Closed) 10/27/14 Groin Left   10/27/14    0836     1522          Intake/Output Last 24 hours  Intake/Output Summary (Last 24 hours) at 12/27/2018 1107 Last data filed at 12/27/2018 1053 Gross per 24 hour  Intake 100 ml  Output -  Net 100 ml    Labs/Imaging Results for orders placed or performed during the hospital encounter of 12/27/18 (from the past 48 hour(s))  Brain natriuretic peptide     Status: Abnormal   Collection Time: 12/27/18  8:36 AM  Result Value Ref Range   B Natriuretic Peptide 133.0 (H) 0.0 - 100.0 pg/mL    Comment: Performed at [facility], [address] [place], [place] [address]  CBC with Differential/Platelet     Status: Abnormal   Collection Time: 12/27/18  8:36 AM  Result Value Ref Range   WBC 13.0 (H) 4.0 - 10.5 K/uL   RBC 5.81 4.22 - 5.81 MIL/uL   Hemoglobin 16.7 13.0 - 17.0 g/dL   HCT 52.4 (H) 39.0 - 52.0 %   MCV 90.2 80.0 - 100.0 fL   MCH 28.7 26.0 - 34.0 pg   MCHC 31.9 30.0 - 36.0 g/dL   RDW 13.0 11.5 - 15.5 %   Platelets 239 150 - 400 K/uL   nRBC 0.0 0.0 -  0.2 %   Neutrophils Relative % 53 %   Neutro Abs 7.0 1.7 - 7.7 K/uL   Lymphocytes Relative 32 %   Lymphs Abs 4.1 (H) 0.7 - 4.0 K/uL   Monocytes Relative 10 %   Monocytes Absolute 1.3 (H) 0.1 - 1.0 K/uL   Eosinophils Relative 4 %   Eosinophils Absolute 0.5 0.0 - 0.5 K/uL   Basophils Relative 1 %   Basophils Absolute 0.1 0.0 - 0.1 K/uL   Immature Granulocytes 0 %   Abs Immature Granulocytes 0.05 0.00 - 0.07 K/uL    Comment: Performed at [facility], [address] [place], [place] [address]  Comprehensive metabolic panel     Status: Abnormal   Collection Time: 12/27/18  8:36 AM  Result Value Ref Range   Sodium 138 135 - 145 mmol/L   Potassium 4.7 3.5 - 5.1 mmol/L   Chloride 102 98 - 111 mmol/L   CO2 21 (L) 22 - 32 mmol/L   Glucose, Bld 268 (H) 70 - 99 mg/dL   BUN 17 8 - 23 mg/dL   Creatinine, Ser 1.05 0.61 - 1.24 mg/dL    Calcium 8.7 (L) 8.9 - 10.3 mg/dL   Total Protein 6.8 6.5 - 8.1 g/dL   Albumin 3.8 3.5 - 5.0 g/dL   AST 84 (H) 15 - 41 U/L   ALT 54 (H) 0 - 44 U/L   Alkaline Phosphatase 85 38 - 126 U/L   Total Bilirubin 1.6 (H) 0.3 - 1.2 mg/dL   GFR calc non Af Amer >60 >60 mL/min   GFR calc Af Amer >60 >60 mL/min   Anion gap 15 5 - 15    Comment: Performed at [facility], [address] [place], [place] [address]  APTT     Status: None   Collection Time: 12/27/18  8:36 AM  Result Value Ref Range   aPTT 28 24 - 36 seconds    Comment: Performed at [facility], [address] [place], [place] [address]  Protime-INR     Status: None   Collection Time: 12/27/18  8:36 AM  Result Value Ref Range   Prothrombin Time 13.5 11.4 - 15.2 seconds   INR 1.0 0.8 - 1.2    Comment: (NOTE) INR goal varies based on device and disease states. Performed at [facility], [place], [place] [address]   Troponin I (High Sensitivity)     Status: Abnormal   Collection Time: 12/27/18  8:36 AM  Result Value Ref Range   Troponin I (High Sensitivity) 22 (H) <18 ng/L    Comment: (NOTE) Elevated high sensitivity troponin I (hsTnI) values and significant  changes across serial measurements may suggest ACS but many other  chronic and acute conditions are known to elevate hsTnI results.  Refer to the "Links" section for chest pain algorithms and additional  guidance. Performed at [facility], [address] [place], [place] [address]   SARS Coronavirus 2 [facility] order, Performed in [facility] hospital lab) Nasopharyngeal Nasopharyngeal Swab     Status: None   Collection Time: 12/27/18  8:36 AM   Specimen: Nasopharyngeal Swab  Result Value Ref Range   SARS Coronavirus 2 NEGATIVE NEGATIVE    Comment: (NOTE) If result is NEGATIVE SARS-CoV-2 target nucleic acids are NOT DETECTED. The SARS-CoV-2 RNA is generally detectable in upper and lower  respiratory specimens  during the acute phase of infection. The lowest  concentration of SARS-CoV-2 viral copies this assay  can detect is 250  copies / mL. A negative result does not preclude SARS-CoV-2 infection  and should not be used as the sole basis for treatment or other  patient management decisions.  A negative result may occur with  improper specimen collection / handling, submission of specimen other  than nasopharyngeal swab, presence of viral mutation(s) within the  areas targeted by this assay, and inadequate number of viral copies  (<250 copies / mL). A negative result must be combined with clinical  observations, patient history, and epidemiological information. If result is POSITIVE SARS-CoV-2 target nucleic acids are DETECTED. The SARS-CoV-2 RNA is generally detectable in upper and lower  respiratory specimens dur ing the acute phase of infection.  Positive  results are indicative of active infection with SARS-CoV-2.  Clinical  correlation with patient history and other diagnostic information is  necessary to determine patient infection status.  Positive results do  not rule out bacterial infection or co-infection with other viruses. If result is PRESUMPTIVE POSTIVE SARS-CoV-2 nucleic acids MAY BE PRESENT.   A presumptive positive result was obtained on the submitted specimen  and confirmed on repeat testing.  While 2019 novel coronavirus  (SARS-CoV-2) nucleic acids may be present in the submitted sample  additional confirmatory testing may be necessary for epidemiological  and / or clinical management purposes  to differentiate between  SARS-CoV-2 and other Sarbecovirus currently known to infect humans.  If clinically indicated additional testing with an alternate test  methodology [phone]) is advised. The SARS-CoV-2 RNA is generally  detectable in upper and lower respiratory sp ecimens during the acute  phase of infection. The expected result is Negative. Fact Sheet for Patients:   StrictlyIdeas.no Fact Sheet for Healthcare Providers: BankingDealers.co.za This test is not yet approved or cleared by the [place] FDA and has been authorized for detection and/or diagnosis of SARS-CoV-2 by FDA under an Emergency Use Authorization (EUA).  This EUA will remain in effect (meaning this test can be used) for the duration of the COVID-19 declaration under Section 564(b)(1) of the Act, 21 U.S.C. section 360bbb-3(b)(1), unless the authorization is terminated or revoked sooner. Performed at [facility], [address] [place], [place] [address]   Lactic acid, plasma     Status: Abnormal   Collection Time: 12/27/18  8:36 AM  Result Value Ref Range   Lactic Acid, Venous 5.3 (HH) 0.5 - 1.9 mmol/L    Comment: CRITICAL RESULT CALLED TO, READ BACK BY AND VERIFIED WITH Jamesetta Greenhalgh ON 12/27/2018 AT 0910 TIK Performed at [facility], [address] [place], [place] [address]   Blood gas, venous     Status: Abnormal   Collection Time: 12/27/18  8:39 AM  Result Value Ref Range   pH, Ven 7.16 (LL) 7.250 - 7.430    Comment: CRITICAL RESULT CALLED TO, READ BACK BY AND VERIFIED WITH: DR MONK [id number] [id number] JGT    pCO2, Ven 67 (H) 44.0 - 60.0 mmHg   pO2, Ven 75.0 (H) 32.0 - 45.0 mmHg   Acid-base deficit 6.0 (H) 0.0 - 2.0 mmol/L   Patient temperature 37.0    Collection site VENOUS    Sample type VENOUS     Comment: Performed at [facility], [address] [place], [place] [address]  Blood gas, venous     Status: Abnormal (Preliminary result)   Collection Time: 12/27/18 10:01 AM  Result Value Ref Range   pH, Ven 7.25 7.250 - 7.430   pCO2, Ven 73 (  HH) 44.0 - 60.0 mmHg    Comment: CRITICAL RESULT CALLED TO, READ BACK BY AND VERIFIED WITH: Laira Penninger RA 1040 [id number] JGT    pO2, Ven PENDING 32.0 - 45.0 mmHg   Patient temperature 37.0    Collection site VENOUS    Sample type VENOUS     Comment: Performed at  [facility], [address] [place], [place] [address]   Dg Chest Portable 1 View  Result Date: 12/27/2018 CLINICAL DATA:  Worsening shortness of breath, diminished breath sounds, COPD EXAM: PORTABLE CHEST 1 VIEW COMPARISON:  None available FINDINGS: Mild cardiomegaly with vascular and interstitial prominence. Difficult to exclude chronic interstitial lung disease versus developing mild edema. Background COPD also suspected. No large effusion or pneumothorax. No focal collapse or consolidation. Trachea midline. Aorta atherosclerotic and degenerative changes noted of the spine. IMPRESSION: Cardiomegaly with vascular and interstitial prominence, nonspecific for edema versus interstitial lung disease Background COPD/emphysema No focal collapse or consolidation Electronically Signed   By: Jerilynn Mages.  Shick M.D.   On: 12/27/2018 09:05    Pending Labs Unresulted Labs (From admission, onward)    Start     Ordered   12/27/18 0929  Culture, blood (routine x 2)  BLOOD CULTURE X 2,   STAT     12/27/18 0928   12/27/18 0840  Lactic acid, plasma  Now then every 2 hours,   STAT     12/27/18 0839          Vitals/Pain Today's Vitals   12/27/18 1000 12/27/18 1015 12/27/18 1030 12/27/18 1045  BP: 99/76 114/87 107/81 110/82  Pulse: 78 92 96   Resp: 16 14 16 18   SpO2: 100% 100% 100%   Weight:      Height:      PainSc:        Isolation Precautions No active isolations  Medications Medications  azithromycin (ZITHROMAX) 500 mg in sodium chloride 0.9 % 250 mL IVPB (500 mg Intravenous New Bag/Given 12/27/18 1010)  ipratropium-albuterol (DUONEB) 0.5-2.5 (3) MG/3ML nebulizer solution 3 mL (3 mLs Nebulization Given 12/27/18 0843)  cefTRIAXone (ROCEPHIN) 1 g in sodium chloride 0.9 % 100 mL IVPB (0 g Intravenous Stopped 12/27/18 1053)  sodium chloride 0.9 % bolus 1,000 mL (1,000 mLs Intravenous New Bag/Given 12/27/18 1002)  albuterol (PROVENTIL) (2.5 MG/3ML) 0.083% nebulizer solution 5 mg (5 mg Nebulization  Given 12/27/18 1053)    Mobility walks Low fall risk   Focused Assessments Pulmonary Assessment Handoff:  Lung sounds: Bilateral Breath Sounds: Diminished L Breath Sounds: Diminished R Breath Sounds: Diminished O2 Device: Bi-PAP        R Recommendations: See Admitting Provider Note  Report given to:   Additional Notes:  Patient

## 2018-12-27 NOTE — ED Provider Notes (Signed)
[facility] Emergency Department Provider Note  ____________________________________________   First MD Initiated Contact with Patient 12/27/18 [phone]     (approximate)  I have reviewed the triage vital signs and the nursing notes.  History  Chief Complaint Respiratory Distress    HPI Theodore Obrien is a 68 y.o. male with history of tobacco use, who presents via EMS for acute onset of respiratory distress that started this morning.  Arrives on non-rebreather with nebulizer treatment in place, tachypneic with increased WOB, oxygen saturation 99 to 100%.  Received DuoNeb x2 and 125 mg Solu-Medrol with EMS.  Patient states symptoms started acutely this morning.  He denies any history of COPD or CHF.  He denies any fevers, cough, sick contacts.  No vomiting or diarrhea.  No swelling.         Past Medical Hx Past Medical History:  Diagnosis Date  . GERD (gastroesophageal reflux disease)   . Hypertension     Problem List Patient Active Problem List   Diagnosis Date Noted  . Anxiety state 07/23/2015  . Benign essential hypertension 07/23/2015  . Drug-induced peripheral neuropathy ([place]) 07/23/2015  . Opioid dependence with withdrawal ([place])   . Opiate dependence ([place]) 03/23/2015  . Hernia of abdominal cavity 12/09/2014    Past Surgical Hx Past Surgical History:  Procedure Laterality Date  . CARPAL TUNNEL RELEASE Left   . FEMORAL HERNIA REPAIR Left 10/27/2014   Procedure: Repair of left femoral and direct hernia with mesh ;  Surgeon: Sherri Rad, MD;  Location: [facility] ORS;  Service: General;  Laterality: Left;  . FRACTURE SURGERY     right leg    Medications Prior to Admission medications   Medication Sig Start Date End Date Taking? Authorizing Provider  albuterol (PROVENTIL HFA) 108 (90 Base) MCG/ACT inhaler Inhale into the lungs. 08/03/17 08/03/18  [provider]  ALPRAZolam Duanne Moron) 0.25 MG tablet Take by mouth. 08/03/17   [provider]   carvedilol (COREG) 3.125 MG tablet Take by mouth. 08/03/17   [provider]  cloNIDine (CATAPRES) 0.1 MG tablet TAKE 1 TABLET BY MOUTH EVERY 8 HOURS 06/19/17   [provider]    Allergies Zyvox [linezolid]  Family Hx Family History  Problem Relation Age of Onset  . Hypertension Mother   . Heart disease Mother   . Prostate cancer Father   . Leukemia Father     Social Hx Social History   Tobacco Use  . Smoking status: Former Smoker    Packs/day: 1.00    Years: 6.00    Pack years: 6.00    Types: Cigarettes    Quit date: 2018    Years since quitting: 2.6  . Smokeless tobacco: Never Used  . Tobacco comment: quit smoking 2018  Substance Use Topics  . Alcohol use: Yes    Comment: occasionally beer  . Drug use: No     Review of Systems  Constitutional: Negative for fever. Negative for chills. Eyes: Negative for visual changes. ENT: Negative for sore throat. Cardiovascular: Negative for chest pain. Respiratory: + for shortness of breath. Gastrointestinal: Negative for abdominal pain. Negative for nausea. Negative for vomiting. Genitourinary: Negative for dysuria. Musculoskeletal: Negative for leg swelling. Skin: Negative for rash. Neurological: Negative for for headaches.   Physical Exam  Vital Signs: ED Triage Vitals  Enc Vitals Group     BP 12/27/18 0835 (!) 190/147     Pulse Rate 12/27/18 0835 (!) 122     Resp 12/27/18 0835 Marland Kitchen)  26     Temp --      Temp src --      SpO2 12/27/18 0835 99 %     Weight 12/27/18 0837 187 lb 6.3 oz (85 kg)     Height 12/27/18 0837 5\' 10"  (1.778 m)     Head Circumference --      Peak Flow --      Pain Score 12/27/18 0837 0     Pain Loc --      Pain Edu? --      Excl. in [place]? --     Constitutional: Alert and oriented.  In respiratory distress. Eyes: Conjunctivae clear. Sclera anicteric. Head: Normocephalic. Atraumatic. Nose: No congestion. No rhinorrhea. Mouth/Throat: Mucous membranes are moist.  Neck: No  stridor.   Cardiovascular: Tachycardic regular rhythm. No murmurs. Extremities well perfused. Respiratory: Tachypneic, in respiratory distress, with increased work of breathing and accessory muscle use.  Diminished breath sounds throughout with associated wheezing. Gastrointestinal: Soft and non-tender. No distention.  Musculoskeletal: No lower extremity edema. Neurologic:  Normal speech and language. No gross focal neurologic deficits are appreciated.  Skin: Skin is diaphoretic. Psychiatric: Mood and affect are appropriate for situation.  EKG  Personally reviewed.   Rate: 102, tachycardic Rhythm: sinus Axis: leftward Intervals: QRS wide due to bundle branch block RBBB - seen on prior No STEMI    Radiology  XR: IMPRESSION: Cardiomegaly with vascular and interstitial prominence, nonspecific for edema versus interstitial lung disease  Background COPD/emphysema  No focal collapse or consolidation      Procedures  Procedure(s) performed (including critical care):  .Critical Care Performed by: Lilia Pro., MD Authorized by: Lilia Pro., MD   Critical care provider statement:    Critical care time (minutes):  30   Critical care was time spent personally by me on the following activities:  Evaluation of patient's response to treatment, examination of patient, ordering and performing treatments and interventions, ordering and review of laboratory studies, ordering and review of radiographic studies, pulse oximetry, re-evaluation of patient's condition, obtaining history from patient or surrogate and review of old charts     Initial Impression / Assessment and Plan / ED Course  68 y.o. male who presents to the ED for acute onset respiratory distress, as above.  On initial arrival, he is tachypneic with increased work of breathing and in respiratory distress.  99 to 100% on nonrebreather with nebulizer treatment.  Placed on BiPAP with improvement in work of  breathing.  No significant extremity edema.  Ddx: COPD, COVID, new onset HF, PNA/other infection  Plan: BiPAP, nebulizer, labs, EKG, XR  9:38 AM Patient's work of breathing is markedly improved on BiPAP, no longer tachypneic, able to speak to me in full sentences.  Work-up reveals leukocytosis to 13, lactic acid of 5.3 with a pH of 7.16, very mild CO2 retention with PCO2 only mildly elevated at 67.  BNP minimally elevated.  Concern for atypical PNA with likely undiagnosed COPD. Obtaining blood cultures, will give antibiotics, fluids.  Awaiting COVID and will admit.  COVID negative, will admit.  Final Clinical Impression(s) / ED Diagnosis  Final diagnoses:  Respiratory distress       Note:  This document was prepared using Dragon voice recognition software and may include unintentional dictation errors.   Lilia Pro., MD 12/27/18 [phone]

## 2018-12-27 NOTE — ED Notes (Signed)
Message sent to Dr. Margaretmary Eddy regarding elevated troponin. Patient transported to CCU by this RN and RT.

## 2018-12-27 NOTE — Consult Note (Addendum)
[facility] Cardiology  CARDIOLOGY CONSULT NOTE  Patient ID: Theodore Obrien MRN: [id number] DOB/AGE: 05/06/1950 68 y.o.  Admit date: 12/27/2018 Referring Physician Dr. Margaretmary Eddy Primary Physician Dr. Ginette Pitman Primary Cardiologist None Reason for Consultation Elevated troponin  HPI:  Theodore Obrien is a 68 y.o. with a past medical history of HTN, cocaine use, tobacco abuse, and GERD, who presented to the ED via EMS this morning in respiratory distress after awaking with significant shortness of breath. He was found to be in acute hypoxic hypercapnic respiratory failure. He received antibiotics, IV solumedrol, IV lasix, and was placed on BiPAP in the ED.   Relevant Cardiac Results - His troponin has been up trending (22 > 97 > 122). However, EKG without evidence of acute ischemia. The patient denies any chest pain this morning or currently. He has no known cardiac history. CXR showed cardiomegaly with nonspecific changes related to edema versus interstitial lung disease. BNP not significantly elevated (133).   He is now admitted to the ICU. He is off BiPAP, now using nasal canula only. He reports his breathing being improved now but he still has some shortness of breath. He continues to deny having any chest pain.   Review of systems complete and found to be negative unless listed above   Past Medical History:  Diagnosis Date  . GERD (gastroesophageal reflux disease)   . Hypertension     Past Surgical History:  Procedure Laterality Date  . CARPAL TUNNEL RELEASE Left   . FEMORAL HERNIA REPAIR Left 10/27/2014   Procedure: Repair of left femoral and direct hernia with mesh ;  Surgeon: Sherri Rad, MD;  Location: [facility] ORS;  Service: General;  Laterality: Left;  . FRACTURE SURGERY     right leg    Medications Prior to Admission  Medication Sig Dispense Refill Last Dose  . ALPRAZolam (XANAX) 0.25 MG tablet Take 0.25 mg by mouth 2 (two) times daily as needed for anxiety or sleep.    12/26/2018 at 2000  .  carvedilol (COREG) 3.125 MG tablet Take 3.125 mg by mouth 2 (two) times daily with a meal.    12/26/2018 at 2000  . cloNIDine (CATAPRES) 0.1 MG tablet Take 0.1 mg by mouth 3 (three) times daily.    12/26/2018 at 2000  . albuterol (PROVENTIL HFA) 108 (90 Base) MCG/ACT inhaler Inhale into the lungs.      Social History   Socioeconomic History  . Marital status: Legally Separated    Spouse name: Not on file  . Number of children: Not on file  . Years of education: Not on file  . Highest education level: Not on file  Occupational History  . Not on file  Social Needs  . Financial resource strain: Not on file  . Food insecurity    Worry: Not on file    Inability: Not on file  . Transportation needs    Medical: Not on file    Non-medical: Not on file  Tobacco Use  . Smoking status: Former Smoker    Packs/day: 1.00    Years: 6.00    Pack years: 6.00    Types: Cigarettes    Quit date: 2018    Years since quitting: 2.6  . Smokeless tobacco: Never Used  . Tobacco comment: quit smoking 2018  Substance and Sexual Activity  . Alcohol use: Yes    Comment: occasionally beer  . Drug use: No  . Sexual activity: Yes  Lifestyle  . Physical activity    Days per week:  Not on file    Minutes per session: Not on file  . Stress: Not on file  Relationships  . Social Herbalist on phone: Not on file    Gets together: Not on file    Attends religious service: Not on file    Active member of club or organization: Not on file    Attends meetings of clubs or organizations: Not on file    Relationship status: Not on file  . Intimate partner violence    Fear of current or ex partner: Not on file    Emotionally abused: Not on file    Physically abused: Not on file    Forced sexual activity: Not on file  Other Topics Concern  . Not on file  Social History Narrative  . Not on file    Family History  Problem Relation Age of Onset  . Hypertension Mother   . Heart disease Mother   .  Prostate cancer Father   . Leukemia Father     Review of systems complete and found to be negative unless listed above    PHYSICAL EXAM  General: Well developed, well nourished, in no acute distress HEENT:  Normocephalic and atramatic Neck:  No JVD.  Lungs: Diffuse wheezing heard throughout anterior and posterior lung fields.  Heart: Distant heart sounds. Regular rate and rhythm. Normal S1 and S2 without gallops or murmurs.  Extremities: No lower extremity edema edema.   Neuro: Alert and oriented X 3. Psych:  Flat affect.   Labs:   Lab Results  Component Value Date   WBC 13.0 (H) 12/27/2018   HGB 16.7 12/27/2018   HCT 52.4 (H) 12/27/2018   MCV 90.2 12/27/2018   PLT 239 12/27/2018    Recent Labs  Lab 12/27/18 0836  NA 138  K 4.7  CL 102  CO2 21*  BUN 17  CREATININE 1.05  CALCIUM 8.7*  PROT 6.8  BILITOT 1.6*  ALKPHOS 85  ALT 54*  AST 84*  GLUCOSE 268*   No results found for: CKTOTAL, CKMB, CKMBINDEX, TROPONINI No results found for: CHOL No results found for: HDL No results found for: LDLCALC No results found for: TRIG No results found for: CHOLHDL No results found for: LDLDIRECT    Radiology: Dg Chest Portable 1 View  Result Date: 12/27/2018 CLINICAL DATA:  Worsening shortness of breath, diminished breath sounds, COPD EXAM: PORTABLE CHEST 1 VIEW COMPARISON:  None available FINDINGS: Mild cardiomegaly with vascular and interstitial prominence. Difficult to exclude chronic interstitial lung disease versus developing mild edema. Background COPD also suspected. No large effusion or pneumothorax. No focal collapse or consolidation. Trachea midline. Aorta atherosclerotic and degenerative changes noted of the spine. IMPRESSION: Cardiomegaly with vascular and interstitial prominence, nonspecific for edema versus interstitial lung disease Background COPD/emphysema No focal collapse or consolidation Electronically Signed   By: Jerilynn Mages.  Shick M.D.   On: 12/27/2018 09:05    EKG:   Sinus rhythm, Rate of 102 BPM, left axis deviation, RBBB, no significant ST-T wave changes  ASSESSMENT AND PLAN:  Theodore Obrien is a 68 year old male who presented to the ED this AM in acute hypoxic hypercapnic respiratory failure. He's had up trending troponins since admission with 22 > 92 > 127. EKG without evidence of acute ischemia. Patient is without chest pain. He has no known coronary artery disease history. Troponin elevation is likely secondary to demand ischemia from hypoxia and respiratory failure. Lower suspicion for acute coronary syndrome at this  time.   1. Continue to trend troponins and monitor symptoms  2. No further cardiac diagnostics or interventions indicated at this time   The patient's history and exam findings were discussed with Dr. Nehemiah Massed. The plan was made in conjunction with Dr. Nehemiah Massed.  Signed: Hilbert Odor PA-C 12/27/2018, 5:15 PM   The patient has been interviewed and examined. I agree with assessment and plan above. Serafina Royals MD [facility]

## 2018-12-27 NOTE — Progress Notes (Signed)
RT assisted with patient transport from ER to ICU while patient on BIPAP. No complication arose during transport.

## 2018-12-27 NOTE — Progress Notes (Signed)
Pt was taken off bipap by NP, placed on 4lpm [place], tolerating well at this time.

## 2018-12-27 NOTE — ED Notes (Signed)
Patient complaining of thirst. Requesting ginger ale to drink. Dr. Margaretmary Eddy at bedside and explaining to patient that he could not come off of mask to have any drink at this time. Offered lemon swabs, however patient refused. Patient verbalized understanding at this time.

## 2018-12-27 NOTE — Consult Note (Addendum)
Name: Theodore Obrien MRN: [id number] DOB: [birth year]    ADMISSION DATE:  12/27/2018 CONSULTATION DATE: 12/27/2018  REFERRING MD : Dr. Margaretmary Eddy   CHIEF COMPLAINT: Shortness of Breath   BRIEF PATIENT DESCRIPTION:  68 yo male current everyday smoker hx admitted with acute hypoxic hypercapnic respiratory failure secondary to pulmonary edema and likely undiagnosed COPD requiring Bipap   SIGNIFICANT EVENTS/STUDIES:  08/27-Pt admitted to the stepdown unit on Bipap   HISTORY OF PRESENT ILLNESS:   This is a 68 yo male with a PMH of HTN, GERD, Current Everyday Smoker, Cocaine Abuse, and Recurrent UTI.  He presented to [facility] ER on 08/27 via EMS with c/o worsening shortness of breath.  EMS administered breathing treatment en route to the ER.  Upon arrival to the ER due to increased work of breathing pt placed on Bipap.  He also received nebulizer treatments x2 dose, iv azithromycin, iv rocephin, 125 mg iv solumedrol, and 1L NS bolus.  CXR concerning for cardiomegaly, background COPD/emphysema, and edema vs interstitial lung disease.  Lab results revealed CO2 21, glucose 268, AST 84, ALT 54, BNP 133, troponin 22, lactic acid 5.3, wbc 13.0, vbg pH 7.16/pCO2 67, and COVID-19 negative.  Pt also hypertensive bp 179/127 and EKG revealed sinus tachycardia, hr 102, RBBB (compared to previous EKG not new finding), and no evidence of ST elevation.  Pt subsequently admitted to the stepdown unit by hospitalist team for additional workup and treatment.   PAST MEDICAL HISTORY :   has a past medical history of GERD (gastroesophageal reflux disease) and Hypertension.  has a past surgical history that includes Fracture surgery; Carpal tunnel release (Left); and Femoral hernia repair (Left, 10/27/2014). Prior to Admission medications   Medication Sig Start Date End Date Taking? Authorizing Provider  ALPRAZolam (XANAX) 0.25 MG tablet Take 0.25 mg by mouth 2 (two) times daily as needed for anxiety or sleep.  08/03/17  Yes  [provider]  carvedilol (COREG) 3.125 MG tablet Take 3.125 mg by mouth 2 (two) times daily with a meal.  08/03/17  Yes [provider]  cloNIDine (CATAPRES) 0.1 MG tablet Take 0.1 mg by mouth 3 (three) times daily.  06/19/17  Yes [provider]  albuterol (PROVENTIL HFA) 108 (90 Base) MCG/ACT inhaler Inhale into the lungs. 08/03/17 08/03/18  [provider]   Allergies  Allergen Reactions   Zyvox [Linezolid]     Nerve damage     FAMILY HISTORY:  family history includes Heart disease in his mother; Hypertension in his mother; Leukemia in his father; Prostate cancer in his father. SOCIAL HISTORY:  reports that he quit smoking about 2 years ago. His smoking use included cigarettes. He has a 6.00 pack-year smoking history. He has never used smokeless tobacco. He reports current alcohol use. He reports that he does not use drugs.  REVIEW OF SYSTEMS:  Positives in BOLD  Constitutional: Negative for fever, chills, weight loss, malaise/fatigue and diaphoresis.  HENT: Negative for hearing loss, ear pain, nosebleeds, congestion, sore throat, neck pain, tinnitus and ear discharge.   Eyes: Negative for blurred vision, double vision, photophobia, pain, discharge and redness.  Respiratory: cough, hemoptysis, sputum production, shortness of breath, wheezing and stridor.   Cardiovascular: Negative for chest pain, palpitations, orthopnea, claudication, leg swelling and PND.  Gastrointestinal: Negative for heartburn, nausea, vomiting, abdominal pain, diarrhea, constipation, blood in stool and melena.  Genitourinary: Negative for dysuria, urgency, frequency, hematuria and flank pain.  Musculoskeletal: Negative for myalgias, back pain, joint pain and falls.  Skin: Negative for itching and rash.  Neurological: Negative for dizziness, tingling, tremors, sensory change, speech change, focal weakness, seizures, loss of consciousness, weakness and headaches.  Endo/Heme/Allergies:  Negative for environmental allergies and polydipsia. Does not bruise/bleed easily.  SUBJECTIVE:  Transitioned off Bipap currently on 3L via nasal canula denies shortness of breath asking for ginger ale   VITAL SIGNS: Pulse Rate:  [78-126] 92 (08/27 1015) Resp:  [13-31] 14 (08/27 1015) BP: (99-190)/(76-147) 114/87 (08/27 1015) SpO2:  [99 %-100 %] 100 % (08/27 1015) FiO2 (%):  [100 %] 100 % (08/27 0859) Weight:  [85 kg] 85 kg (08/27 0837)  PHYSICAL EXAMINATION: General: well developed, well nourished male, NAD on nasal canula Neuro: alert and oriented, follows commands  HEENT: supple, no JVD  Cardiovascular: sinus rhythm with RBBB, no R/G Lungs: diminished throughout, even, non labored  Abdomen: +BS x4, soft, non distended, non tender  Musculoskeletal: normal bulk and tone, no edema  Skin: intact no rashes or lesions present   Recent Labs  Lab 12/27/18 0836  NA 138  K 4.7  CL 102  CO2 21*  BUN 17  CREATININE 1.05  GLUCOSE 268*   Recent Labs  Lab 12/27/18 0836  HGB 16.7  HCT 52.4*  WBC 13.0*  PLT 239   Dg Chest Portable 1 View  Result Date: 12/27/2018 CLINICAL DATA:  Worsening shortness of breath, diminished breath sounds, COPD EXAM: PORTABLE CHEST 1 VIEW COMPARISON:  None available FINDINGS: Mild cardiomegaly with vascular and interstitial prominence. Difficult to exclude chronic interstitial lung disease versus developing mild edema. Background COPD also suspected. No large effusion or pneumothorax. No focal collapse or consolidation. Trachea midline. Aorta atherosclerotic and degenerative changes noted of the spine. IMPRESSION: Cardiomegaly with vascular and interstitial prominence, nonspecific for edema versus interstitial lung disease Background COPD/emphysema No focal collapse or consolidation Electronically Signed   By: Jerilynn Mages.  Shick M.D.   On: 12/27/2018 09:05    ASSESSMENT / PLAN:  Acute hypoxic hypercapnic respiratory failure likely secondary to mild pulmonary  edema and undiagnosed COPD Hx: Current Everyday Smoker, Cocaine Abuse  Prn Bipap or supplemental O2 for dyspnea and/or hypoxia Scheduled and prn bronchodilator therapy  Nebulized and iv steroids  Continue iv azithromycin-stop date 08/31 Urine drug screen pending  Smoking cessation counseling provided  Will order nicotine patch  Will need PFT's in outpatient setting   Mildly elevated troponin likely demand ischemia in setting of respiratory failure  Hypertension  Continuous telemetry monitoring  Trend troponin Echo pending  Continue outpatient clonidine   Elevated liver enzymes-pt drinks 3 beers daily  Trend hepatic panel  CIWA protocol Will start thiamine, folic acid, and mvi ETOH cessation counseling provided   Mild leukocytosis no obvious signs of infection  Lactic acidosis-improving  Trend WBC and monitor fever curve  Will check PCT if within normal limits will discontinue ceftriaxone  Trend lactic acid  Continue azithromycin-stop date 08/31  Hyperglycemia  Hemoglobin A1c pending  CBG's ac/hs  SSI   GERD  Continue protonix   VTE px: subq lovenox   Marda Stalker, [place] Pager [phone] (please enter 7 digits) PCCM Consult Pager [phone] (please enter 7 digits)

## 2018-12-27 NOTE — ED Notes (Signed)
Patient hit his call light to let this RN know he was feeling nausea. Verbal order for IV zofran given by Dr. Joan Mayans.

## 2018-12-27 NOTE — Progress Notes (Signed)
Patient began slamming his fist on the tray table, thrashing around in bed and moaning. Patient stated that he was going through withdrawals from heroin. NP notified. New order for IV Ativan to be given.  Patient breathing in the 40s, heart rate in the 120s. Patient placed back on Bi-Pap.  Currently resting in bed.  Will continue to monitor.

## 2018-12-27 NOTE — ED Triage Notes (Addendum)
Patient from home via [facility]. Patient reports worsening SOB. Patient arrives getting breathing tx from EMS. Diminished lung sounds bilaterally. Patient denies respiratory history. Denies recent cough or fever. Denies contact with anyone sick. EDP and RT at bedside. Patient placed on bipap. Patient given 1 duoneb and 125 mg solumedrol by EMS PTA.

## 2018-12-27 NOTE — H&P (Signed)
[facility] [place] NAME: Theodore Obrien    MR#:  [id number]  DATE OF BIRTH:  28-Apr-1951  DATE OF ADMISSION:  12/27/2018  PRIMARY CARE PHYSICIAN: Tracie Harrier, MD   REQUESTING/REFERRING PHYSICIAN:   CHIEF COMPLAINT:  sob  HISTORY OF PRESENT ILLNESS:  Theodore Obrien  is a 68 y.o. male with a known history of GERD and hypertension has been short of breath, smoking 1 pack a day and history of drug abuse in the past is brought into the emergency department via EMS for worsening of the shortness of breath.  Patient called EMS as he could not breathe and feeling extremely tight in his chest.  Patient was given IV Solu-Medrol by the EMS and eventually patient is placed on BiPAP for worsening of breathing in the emergency department.  Chest x-ray with possible edema versus interstitial lung disease and troponin initially was 22.  Hospitalist team is called to admit the patient.  Covid test is negative  PAST MEDICAL HISTORY:   Past Medical History:  Diagnosis Date  . GERD (gastroesophageal reflux disease)   . Hypertension     PAST SURGICAL HISTOIRY:   Past Surgical History:  Procedure Laterality Date  . CARPAL TUNNEL RELEASE Left   . FEMORAL HERNIA REPAIR Left 10/27/2014   Procedure: Repair of left femoral and direct hernia with mesh ;  Surgeon: Sherri Rad, MD;  Location: [facility] ORS;  Service: General;  Laterality: Left;  . FRACTURE SURGERY     right leg    SOCIAL HISTORY:   Social History   Tobacco Use  . Smoking status: Former Smoker    Packs/day: 1.00    Years: 6.00    Pack years: 6.00    Types: Cigarettes    Quit date: 2018    Years since quitting: 2.6  . Smokeless tobacco: Never Used  . Tobacco comment: quit smoking 2018  Substance Use Topics  . Alcohol use: Yes    Comment: occasionally beer    FAMILY HISTORY:   Family History  Problem Relation Age of Onset  . Hypertension Mother   . Heart disease Mother   .  Prostate cancer Father   . Leukemia Father     DRUG ALLERGIES:   Allergies  Allergen Reactions  . Zyvox [Linezolid]     Nerve damage     REVIEW OF SYSTEMS:  CONSTITUTIONAL: No fever, fatigue or weakness.  EYES: No blurred or double vision.  EARS, NOSE, AND THROAT: No tinnitus or ear pain.  RESPIRATORY: No cough, and severe shortness of breath, wheezing, denies hemoptysis.  CARDIOVASCULAR: No chest pain, orthopnea, edema.  GASTROINTESTINAL: No nausea, vomiting, diarrhea or abdominal pain.  GENITOURINARY: No dysuria, hematuria.  ENDOCRINE: No polyuria, nocturia,  HEMATOLOGY: No anemia, easy bruising or bleeding SKIN: No rash or lesion. MUSCULOSKELETAL: No joint pain or arthritis.   NEUROLOGIC: No tingling, numbness, weakness.  PSYCHIATRY: No anxiety or depression.   MEDICATIONS AT HOME:   Prior to Admission medications   Medication Sig Start Date End Date Taking? Authorizing Provider  ALPRAZolam (XANAX) 0.25 MG tablet Take 0.25 mg by mouth 2 (two) times daily as needed for anxiety or sleep.  08/03/17  Yes [provider]  carvedilol (COREG) 3.125 MG tablet Take 3.125 mg by mouth 2 (two) times daily with a meal.  08/03/17  Yes [provider]  cloNIDine (CATAPRES) 0.1 MG tablet Take 0.1 mg by mouth 3 (three) times daily.  06/19/17  Yes [provider]  albuterol (PROVENTIL HFA) 108 (90 Base) MCG/ACT inhaler Inhale into the lungs. 08/03/17 08/03/18  [provider]      VITAL SIGNS:  Blood pressure 102/77, pulse 94, resp. rate 14, height 5\' 10"  (1.778 m), weight 85 kg, SpO2 100 %.  PHYSICAL EXAMINATION:  GENERAL:  68 y.o.-year-old patient lying in the bed with no acute distress.  EYES: Pupils equal, round, reactive to light and accommodation. No scleral icterus. Extraocular muscles intact.  HEENT: Head atraumatic, normocephalic. Oropharynx and nasopharynx clear.  NECK:  Supple, no jugular venous distention. No thyroid enlargement, no tenderness.   LUNGS: Diminished breath sounds bilaterally, diffuse wheezing, some rales,rhonchi or crepitation. No use of accessory muscles of respiration.  CARDIOVASCULAR: S1, S2 normal. No murmurs, rubs, or gallops.  ABDOMEN: Soft, nontender, nondistended. Bowel sounds present.  EXTREMITIES: No pedal edema, cyanosis, or clubbing.  NEUROLOGIC: Cranial nerves II through XII are intact. Muscle strength 5/5 in all extremities. Sensation intact. Gait not checked.  PSYCHIATRIC: The patient is alert and oriented x 3.  SKIN: No obvious rash, lesion, or ulcer.   LABORATORY PANEL:   CBC Recent Labs  Lab 12/27/18 0836  WBC 13.0*  HGB 16.7  HCT 52.4*  PLT 239   ------------------------------------------------------------------------------------------------------------------  Chemistries  Recent Labs  Lab 12/27/18 0836  NA 138  K 4.7  CL 102  CO2 21*  GLUCOSE 268*  BUN 17  CREATININE 1.05  CALCIUM 8.7*  AST 84*  ALT 54*  ALKPHOS 85  BILITOT 1.6*   ------------------------------------------------------------------------------------------------------------------  Cardiac Enzymes No results for input(s): TROPONINI in the last 168 hours. ------------------------------------------------------------------------------------------------------------------  RADIOLOGY:  Dg Chest Portable 1 View  Result Date: 12/27/2018 CLINICAL DATA:  Worsening shortness of breath, diminished breath sounds, COPD EXAM: PORTABLE CHEST 1 VIEW COMPARISON:  None available FINDINGS: Mild cardiomegaly with vascular and interstitial prominence. Difficult to exclude chronic interstitial lung disease versus developing mild edema. Background COPD also suspected. No large effusion or pneumothorax. No focal collapse or consolidation. Trachea midline. Aorta atherosclerotic and degenerative changes noted of the spine. IMPRESSION: Cardiomegaly with vascular and interstitial prominence, nonspecific for edema versus interstitial lung  disease Background COPD/emphysema No focal collapse or consolidation Electronically Signed   By: Jerilynn Mages.  Shick M.D.   On: 12/27/2018 09:05    EKG:   Orders placed or performed during the hospital encounter of 12/27/18  . EKG 12-Lead  . EKG 12-Lead  . ED EKG  . ED EKG  . EKG 12-Lead  . EKG 12-Lead    IMPRESSION AND PLAN:    # Acute hypoxic respiratory failure 2/2 COPD exacerbation Admit to stepdown unit BiPAP Solu-Medrol 125 mg IV was given by the EMS and will continue Solu-Medrol 60 mg IV every 6 hours DuoNebs every 6 hours and albuterol every 4 hours as needed for shortness of breath and wheezing Rocephin  N.p.o. while patient is on BiPAP Pulmonology consult placed , dr.Aleskerov is aware of the consult  #Acute bronchitis IV Rocephin and bronchodilator treatment   #Elevated troponin probably from demand ischemia Monitor patient on telemetry cycle cardiac biomarkers Cardiology consult placed to Dr. Montine Circle  #Essential hypertension Currently patient is n.p.o. we will hold blood pressure medications   #Anxiety Currently patient is on BiPAP and n.p.o. IV Ativan as needed  #Tobacco abuse disorder Counseled patient to quit smoking.  He will consider to quit smoking    DVT prophylaxis with Lovenox subcu GI prophylaxis      All the records are reviewed and case discussed  with ED provider. Management plans discussed with the patient,he is  in agreement.  CODE STATUS: fc   TOTAL criticalc care TIME TAKING CARE OF THIS PATIENT: 50  minutes.   Note: This dictation was prepared with Dragon dictation along with smaller phrase technology. Any transcriptional errors that result from this process are unintentional.  Nicholes Mango M.D on 12/27/2018 at 12:20 PM  Between 7am to 6pm - Pager - [phone]  After 6pm go to [email or web address] - password [email or web address] [facility]  Office  [phone]  CC: Primary care physician; Tracie Harrier, MD

## 2018-12-28 ENCOUNTER — Inpatient Hospital Stay: Payer: Medicare Other

## 2018-12-28 ENCOUNTER — Inpatient Hospital Stay (HOSPITAL_COMMUNITY)
Admit: 2018-12-28 | Discharge: 2018-12-28 | Disposition: A | Discharge status: Home / Self Care | Payer: Medicare Other | Attending: Critical Care Medicine | Admitting: Critical Care Medicine

## 2018-12-28 DIAGNOSIS — R0602 Shortness of breath: Secondary | ICD-10-CM

## 2018-12-28 LAB — CBC WITH DIFFERENTIAL/PLATELET
Abs Immature Granulocytes: 0.03 10*3/uL (ref 0.00–0.07)
Basophils Absolute: 0 10*3/uL (ref 0.0–0.1)
Basophils Relative: 0 %
Eosinophils Absolute: 0 10*3/uL (ref 0.0–0.5)
Eosinophils Relative: 0 %
HCT: 47.5 % (ref 39.0–52.0)
Hemoglobin: 15.7 g/dL (ref 13.0–17.0)
Immature Granulocytes: 0 %
Lymphocytes Relative: 8 %
Lymphs Abs: 0.8 10*3/uL (ref 0.7–4.0)
MCH: 28.5 pg (ref 26.0–34.0)
MCHC: 33.1 g/dL (ref 30.0–36.0)
MCV: 86.2 fL (ref 80.0–100.0)
Monocytes Absolute: 0.6 10*3/uL (ref 0.1–1.0)
Monocytes Relative: 6 %
Neutro Abs: 9 10*3/uL — ABNORMAL HIGH (ref 1.7–7.7)
Neutrophils Relative %: 86 %
Platelets: 153 10*3/uL (ref 150–400)
RBC: 5.51 MIL/uL (ref 4.22–5.81)
RDW: 12.9 % (ref 11.5–15.5)
WBC: 10.4 10*3/uL (ref 4.0–10.5)
nRBC: 0 % (ref 0.0–0.2)

## 2018-12-28 LAB — COMPREHENSIVE METABOLIC PANEL
ALT: 44 U/L (ref 0–44)
AST: 33 U/L (ref 15–41)
Albumin: 3.6 g/dL (ref 3.5–5.0)
Alkaline Phosphatase: 72 U/L (ref 38–126)
Anion gap: 10 (ref 5–15)
BUN: 24 mg/dL — ABNORMAL HIGH (ref 8–23)
CO2: 25 mmol/L (ref 22–32)
Calcium: 8.9 mg/dL (ref 8.9–10.3)
Chloride: 105 mmol/L (ref 98–111)
Creatinine, Ser: 0.92 mg/dL (ref 0.61–1.24)
GFR calc Af Amer: >60 mL/min (ref >60)
GFR calc non Af Amer: >60 mL/min (ref >60)
Glucose, Bld: 213 mg/dL — ABNORMAL HIGH (ref 70–99)
Potassium: 4 mmol/L (ref 3.5–5.1)
Sodium: 140 mmol/L (ref 135–145)
Total Bilirubin: 1.7 mg/dL — ABNORMAL HIGH (ref 0.3–1.2)
Total Protein: 6.5 g/dL (ref 6.5–8.1)

## 2018-12-28 LAB — GLUCOSE, CAPILLARY
Glucose-Capillary: 209 mg/dL — ABNORMAL HIGH (ref 70–99)
Glucose-Capillary: 167 mg/dL — ABNORMAL HIGH (ref 70–99)
Glucose-Capillary: 157 mg/dL — ABNORMAL HIGH (ref 70–99)
Glucose-Capillary: 143 mg/dL — ABNORMAL HIGH (ref 70–99)

## 2018-12-28 LAB — ECHOCARDIOGRAM COMPLETE
Height: 70 in
Weight: 2998.26 oz

## 2018-12-28 LAB — PHOSPHORUS: Phosphorus: 2.8 mg/dL (ref 2.5–4.6)

## 2018-12-28 LAB — TROPONIN I (HIGH SENSITIVITY)
Troponin I (High Sensitivity): 485 ng/L (ref <18)
Troponin I (High Sensitivity): 391 ng/L (ref <18)

## 2018-12-28 LAB — MAGNESIUM: Magnesium: 2.1 mg/dL (ref 1.7–2.4)

## 2018-12-28 LAB — PROCALCITONIN: Procalcitonin: 0.12 ng/mL

## 2018-12-28 LAB — POTASSIUM: Potassium: 4.3 mmol/L (ref 3.5–5.1)

## 2018-12-28 LAB — BRAIN NATRIURETIC PEPTIDE: B Natriuretic Peptide: 2499 pg/mL — ABNORMAL HIGH (ref 0.0–100.0)

## 2018-12-28 MED ORDER — MORPHINE SULFATE (PF) 2 MG/ML IV SOLN
INTRAVENOUS | Status: AC
  Filled 2018-12-28: qty 1

## 2018-12-28 MED ORDER — MORPHINE SULFATE (PF) 2 MG/ML IV SOLN
2.0000 mg | INTRAVENOUS | Status: DC | PRN
  Administered 2018-12-28 – 2018-12-31 (×12): 2 mg via INTRAVENOUS
  Filled 2018-12-28 (×11): qty 1

## 2018-12-28 MED ORDER — MORPHINE SULFATE (PF) 2 MG/ML IV SOLN
2.0000 mg | Freq: Once | INTRAVENOUS | Status: AC
  Administered 2018-12-28: 2 mg via INTRAVENOUS

## 2018-12-28 MED ORDER — DILTIAZEM HCL 25 MG/5ML IV SOLN: 10.0000 mg | Freq: Once | INTRAVENOUS | Status: DC

## 2018-12-28 MED ORDER — FUROSEMIDE 10 MG/ML IJ SOLN
20.0000 mg | Freq: Once | INTRAMUSCULAR | Status: AC
  Administered 2018-12-28: 20 mg via INTRAVENOUS
  Filled 2018-12-28: qty 2

## 2018-12-28 NOTE — Progress Notes (Signed)
[facility] Encounter Note  Patient: Theodore Obrien / Admit Date: 12/27/2018 / Date of Encounter: 12/28/2018, 8:14 AM   Subjective: Patient continues to have respiratory failure with hypoxia requiring supplemental oxygen.  No current evidence of chest discomfort or anginal symptoms.  Patient does have an elevated troponin change more consistent with demand ischemia from hypoxia rather than acute coronary syndrome.  EKG is abnormal but not significantly different from before with normal sinus rhythm with left anterior fascicular block.  Hemodynamically stable today  Review of Systems: Positive for: Shortness of breath Negative for: Vision change, hearing change, syncope, dizziness, nausea, vomiting,diarrhea, bloody stool, stomach pain, cough, congestion, diaphoresis, urinary frequency, urinary pain,skin lesions, skin rashes Others previously listed  Objective: Telemetry: Sinus tachycardia Physical Exam: Blood pressure (!) 136/94, pulse 78, temperature 98.7 F (37.1 C), temperature source Axillary, resp. rate (!) 23, height 5\' 10"  (1.778 m), weight 85 kg, SpO2 94 %. Body mass index is 26.89 kg/m. General: Well developed, well nourished, in no acute distress. Head: Normocephalic, atraumatic, sclera non-icteric, no xanthomas, nares are without discharge. Neck: No apparent masses Lungs: Normal respirations with diffuse  wheezes, some rhonchi, no rales , no crackles   Heart: Regular rate and rhythm, normal S1 S2, no murmur, no rub, no gallop, PMI is normal size and placement, carotid upstroke normal without bruit, jugular venous pressure normal Abdomen: Soft, non-tender, non-distended with normoactive bowel sounds. No hepatosplenomegaly. Abdominal aorta is normal size without bruit Extremities: No edema, no clubbing, no cyanosis, no ulcers,  Peripheral: 2+ radial, 2+ femoral, 2+ dorsal pedal pulses Neuro: Alert and oriented. Moves all extremities spontaneously. Psych:   Responds to questions appropriately with a normal affect.   Intake/Output Summary (Last 24 hours) at 12/28/2018 0814 Last data filed at 12/28/2018 0600 Gross per 24 hour  Intake 2830.4 ml  Output 1600 ml  Net 1230.4 ml    Inpatient Medications:  . azithromycin  500 mg Oral Daily  . budesonide (PULMICORT) nebulizer solution  0.5 mg Nebulization BID  . Chlorhexidine Gluconate Cloth  6 each Topical Daily  . enoxaparin (LOVENOX) injection  40 mg Subcutaneous Q24H  . folic acid  1 mg Oral Daily  . insulin aspart  0-15 Units Subcutaneous TID WC  . insulin aspart  0-5 Units Subcutaneous QHS  . ipratropium-albuterol  3 mL Nebulization Q6H  . mouth rinse  15 mL Mouth Rinse BID  . methylPREDNISolone (SOLU-MEDROL) injection  40 mg Intravenous Q12H  . multivitamin with minerals  1 tablet Oral Daily  . nicotine  14 mg Transdermal Daily  . pantoprazole (PROTONIX) IV  40 mg Intravenous Q24H  . thiamine  100 mg Oral Daily   Infusions:  . cefTRIAXone (ROCEPHIN)  IV Stopped (12/28/18 0533)  . dexmedetomidine (PRECEDEX) IV infusion 0.8 mcg/kg/hr (12/28/18 0651)    Labs: Recent Labs    12/27/18 0836 12/28/18 0659  NA 138 140  K 4.7 4.0  CL 102 105  CO2 21* 25  GLUCOSE 268* 213*  BUN 17 24*  CREATININE 1.05 0.92  CALCIUM 8.7* 8.9   Recent Labs    12/27/18 0836 12/28/18 0659  AST 84* 33  ALT 54* 44  ALKPHOS 85 72  BILITOT 1.6* 1.7*  PROT 6.8 6.5  ALBUMIN 3.8 3.6   Recent Labs    12/27/18 0836 12/28/18 0659  WBC 13.0* 10.4  NEUTROABS 7.0 9.0*  HGB 16.7 15.7  HCT 52.4* 47.5  MCV 90.2 86.2  PLT 239 153   No results for  input(s): CKTOTAL, CKMB, TROPONINI in the last 72 hours. Invalid input(s): POCBNP Recent Labs    12/27/18 1253  HGBA1C 5.9*     Weights: Filed Weights   12/27/18 0837  Weight: 85 kg     Radiology/Studies:  Dg Chest Port 1 View  Result Date: 12/28/2018 CLINICAL DATA:  Acute respiratory distress EXAM: PORTABLE CHEST 1 VIEW COMPARISON:   12/27/2018 FINDINGS: No significant change in fairly extensive interstitial lung opacities. No significant effusion. Stable cardiomediastinal silhouette. No pneumothorax. IMPRESSION: Similar appearance of fairly extensive bilateral interstitial opacities, some of which may be secondary to underlying chronic interstitial disease. Suspect that there is acute superimposed edema or interstitial inflammatory process/possible atypical pneumonia. Electronically Signed   By: Donavan Foil M.D.   On: 12/28/2018 00:52   Dg Chest Portable 1 View  Result Date: 12/27/2018 CLINICAL DATA:  Worsening shortness of breath, diminished breath sounds, COPD EXAM: PORTABLE CHEST 1 VIEW COMPARISON:  None available FINDINGS: Mild cardiomegaly with vascular and interstitial prominence. Difficult to exclude chronic interstitial lung disease versus developing mild edema. Background COPD also suspected. No large effusion or pneumothorax. No focal collapse or consolidation. Trachea midline. Aorta atherosclerotic and degenerative changes noted of the spine. IMPRESSION: Cardiomegaly with vascular and interstitial prominence, nonspecific for edema versus interstitial lung disease Background COPD/emphysema No focal collapse or consolidation Electronically Signed   By: Jerilynn Mages.  Shick M.D.   On: 12/27/2018 09:05     Assessment and Recommendation  68 y.o. male with diabetes hypertension hyperlipidemia and acute respiratory failure with elevated troponin consistent with demand ischemia rather than acute coronary syndrome continuing to need further medical management 1.  Continue respiratory failure treatment with appropriate antibiotics inhalers pulmonary toilet and oxygen supplementation 2.  No further invasive cardiac intervention at this time due to no anginal symptoms and no evidence of acute coronary syndrome with elevated troponin most consistent with demand ischemia 3.  Continue medication management for risk factors with hypertension  control lipid management and diabetes treatment as per above 4.  Further consideration of further intervention depending on any potential symptoms from the cardiac standpoint and recovery  Signed, Serafina Royals M.D. FACC

## 2018-12-28 NOTE — Progress Notes (Signed)
CRITICAL CARE PROGRESS NOTE    Name: Theodore Obrien MRN: [id number] DOB: Jun 27, 1950     LOS: 1   SUBJECTIVE FINDINGS & SIGNIFICANT EVENTS   Patient description:   68 yo male current everyday smoker hx admitted with acute hypoxic hypercapnic respiratory failure secondary to pulmonary edema and likely undiagnosed COPD requiring Bipap    Lines / Drains: PIV   Cultures / Sepsis markers: Blood cx nasl MRSA  sars cov neg  Antibiotics: zithromax rocephin  Protocols / Consultants: cardiology  Tests / Events: tte     PAST MEDICAL HISTORY   Past Medical History:  Diagnosis Date  . GERD (gastroesophageal reflux disease)   . Hypertension      SURGICAL HISTORY   Past Surgical History:  Procedure Laterality Date  . CARPAL TUNNEL RELEASE Left   . FEMORAL HERNIA REPAIR Left 10/27/2014   Procedure: Repair of left femoral and direct hernia with mesh ;  Surgeon: Sherri Rad, MD;  Location: [facility] ORS;  Service: General;  Laterality: Left;  . FRACTURE SURGERY     right leg     FAMILY HISTORY   Family History  Problem Relation Age of Onset  . Hypertension Mother   . Heart disease Mother   . Prostate cancer Father   . Leukemia Father      SOCIAL HISTORY   Social History   Tobacco Use  . Smoking status: Former Smoker    Packs/day: 1.00    Years: 6.00    Pack years: 6.00    Types: Cigarettes    Quit date: 2018    Years since quitting: 2.6  . Smokeless tobacco: Never Used  . Tobacco comment: quit smoking 2018  Substance Use Topics  . Alcohol use: Yes    Comment: occasionally beer  . Drug use: No     MEDICATIONS   Current Medication:  Current Facility-Administered Medications:  .  albuterol (PROVENTIL) (2.5 MG/3ML) 0.083% nebulizer solution 2.5 mg, 2.5 mg, Nebulization, Q4H PRN,  Gouru, Aruna, MD .  azithromycin (ZITHROMAX) tablet 500 mg, 500 mg, Oral, Daily, Gouru, Aruna, MD .  budesonide (PULMICORT) nebulizer solution 0.5 mg, 0.5 mg, Nebulization, BID, Gouru, Aruna, MD, 0.5 mg at 12/28/18 0752 .  cefTRIAXone (ROCEPHIN) 1 g in sodium chloride 0.9 % 100 mL IVPB, 1 g, Intravenous, Q24H, Gouru, Aruna, MD, Stopped at 12/28/18 0533 .  Chlorhexidine Gluconate Cloth 2 % PADS 6 each, 6 each, Topical, Daily, Awilda Bill, NP, 6 each at 12/27/18 1244 .  dexmedetomidine (PRECEDEX) 400 MCG/100ML (4 mcg/mL) infusion, 0.4-1.2 mcg/kg/hr, Intravenous, Titrated, Darel Hong D, NP, Last Rate: 17 mL/hr at 12/28/18 0651, 0.8 mcg/kg/hr at 12/28/18 0651 .  enoxaparin (LOVENOX) injection 40 mg, 40 mg, Subcutaneous, Q24H, Gouru, Aruna, MD, 40 mg at 12/27/18 1732 .  folic acid (FOLVITE) tablet 1 mg, 1 mg, Oral, Daily, Gouru, Aruna, MD, 1 mg at 12/27/18 1244 .  insulin aspart (novoLOG) injection 0-15 Units, 0-15 Units, Subcutaneous, TID WC, Gouru, Aruna, MD, 5 Units at 12/28/18 0840 .  insulin aspart (novoLOG) injection 0-5 Units, 0-5 Units, Subcutaneous, QHS, Gouru, Aruna, MD .  ipratropium-albuterol (DUONEB) 0.5-2.5 (3) MG/3ML nebulizer solution 3 mL, 3 mL, Nebulization, Q6H, Gouru, Aruna, MD, 3 mL at 12/28/18 0752 .  LORazepam (ATIVAN) injection 2-3 mg, 2-3 mg, Intravenous, Q1H PRN, Darel Hong D, NP, 2 mg at 12/27/18 2337 .  MEDLINE mouth rinse, 15 mL, Mouth Rinse, BID, Darel Hong D, NP, 15 mL at 12/27/18 2139 .  methylPREDNISolone sodium succinate (SOLU-MEDROL)  40 mg/mL injection 40 mg, 40 mg, Intravenous, Q12H, Gouru, Aruna, MD, 40 mg at 12/27/18 2308 .  morphine 2 MG/ML injection 2 mg, 2 mg, Intravenous, Q3H PRN, Darel Hong D, NP, 2 mg at 12/28/18 0308 .  multivitamin with minerals tablet 1 tablet, 1 tablet, Oral, Daily, Gouru, Aruna, MD, 1 tablet at 12/27/18 1244 .  nicotine (NICODERM CQ - dosed in mg/24 hours) patch 14 mg, 14 mg, Transdermal, Daily, Awilda Bill,  NP, 14 mg at 12/27/18 1358 .  ondansetron (ZOFRAN) tablet 4 mg, 4 mg, Oral, Q6H PRN **OR** ondansetron (ZOFRAN) injection 4 mg, 4 mg, Intravenous, Q6H PRN, Gouru, Aruna, MD .  pantoprazole (PROTONIX) injection 40 mg, 40 mg, Intravenous, Q24H, Gouru, Aruna, MD, 40 mg at 12/27/18 1228 .  thiamine (VITAMIN B-1) tablet 100 mg, 100 mg, Oral, Daily, Gouru, Aruna, MD, 100 mg at 12/27/18 1358    ALLERGIES   Zyvox [linezolid]    REVIEW OF SYSTEMS    10 point ros done and is neg except as per subj findings  PHYSICAL EXAMINATION   Vital Signs: Temp:  [97.9 F (36.6 C)-98.8 F (37.1 C)] 98.7 F (37.1 C) (08/28 0200) Pulse Rate:  [78-128] 78 (08/28 0753) Resp:  [14-38] 23 (08/28 0753) BP: (99-157)/(76-117) 136/94 (08/28 0600) SpO2:  [86 %-100 %] 94 % (08/28 0753) FiO2 (%):  [35 %-50 %] 50 % (08/28 0753)  GENERAL:mild distress due to AECOPD HEAD: Normocephalic, atraumatic.  EYES: Pupils equal, round, reactive to light.  No scleral icterus.  MOUTH: Moist mucosal membrane. NECK: Supple. No thyromegaly. No nodules. No JVD.  PULMONARY: rhonchi bilaterally  CARDIOVASCULAR: S1 and S2. Regular rate and rhythm. No murmurs, rubs, or gallops.  GASTROINTESTINAL: Soft, nontender, non-distended. No masses. Positive bowel sounds. No hepatosplenomegaly.  MUSCULOSKELETAL: No swelling, clubbing, or edema.  NEUROLOGIC: Mild distress due to acute illness SKIN:intact,warm,dry   PERTINENT DATA     Infusions: . cefTRIAXone (ROCEPHIN)  IV Stopped (12/28/18 0533)  . dexmedetomidine (PRECEDEX) IV infusion 0.8 mcg/kg/hr (12/28/18 [id number])   Scheduled Medications: . azithromycin  500 mg Oral Daily  . budesonide (PULMICORT) nebulizer solution  0.5 mg Nebulization BID  . Chlorhexidine Gluconate Cloth  6 each Topical Daily  . enoxaparin (LOVENOX) injection  40 mg Subcutaneous Q24H  . folic acid  1 mg Oral Daily  . insulin aspart  0-15 Units Subcutaneous TID WC  . insulin aspart  0-5 Units Subcutaneous  QHS  . ipratropium-albuterol  3 mL Nebulization Q6H  . mouth rinse  15 mL Mouth Rinse BID  . methylPREDNISolone (SOLU-MEDROL) injection  40 mg Intravenous Q12H  . multivitamin with minerals  1 tablet Oral Daily  . nicotine  14 mg Transdermal Daily  . pantoprazole (PROTONIX) IV  40 mg Intravenous Q24H  . thiamine  100 mg Oral Daily   PRN Medications: albuterol, LORazepam, morphine injection, ondansetron **OR** ondansetron (ZOFRAN) IV Hemodynamic parameters:   Intake/Output: 08/27 0701 - 08/28 0700 In: 2830.4 [P.O.:480; I.V.:900.4; IV Piggyback:1450] Out: 1600 [Urine:1600]  Ventilator  Settings: FiO2 (%):  [35 %-50 %] 50 %   Other Labs:  LAB RESULTS:  Basic Metabolic Panel: Recent Labs  Lab 12/27/18 0836 12/28/18 0659  NA 138 140  K 4.7 4.0  CL 102 105  CO2 21* 25  GLUCOSE 268* 213*  BUN 17 24*  CREATININE 1.05 0.92  CALCIUM 8.7* 8.9   Liver Function Tests: Recent Labs  Lab 12/27/18 0836 12/28/18 0659  AST 84* 33  ALT 54* 44  ALKPHOS 85  72  BILITOT 1.6* 1.7*  PROT 6.8 6.5  ALBUMIN 3.8 3.6   No results for input(s): LIPASE, AMYLASE in the last 168 hours. No results for input(s): AMMONIA in the last 168 hours. CBC: Recent Labs  Lab 12/27/18 0836 12/28/18 0659  WBC 13.0* 10.4  NEUTROABS 7.0 9.0*  HGB 16.7 15.7  HCT 52.4* 47.5  MCV 90.2 86.2  PLT 239 153   Cardiac Enzymes: No results for input(s): CKTOTAL, CKMB, CKMBINDEX, TROPONINI in the last 168 hours. BNP: Invalid input(s): POCBNP CBG: Recent Labs  Lab 12/27/18 1211 12/27/18 1536 12/27/18 2127 12/28/18 0829  GLUCAP 162* 180* 149* 209*     IMAGING RESULTS:  Imaging: Dg Chest Port 1 View  Result Date: 12/28/2018 CLINICAL DATA:  Acute respiratory distress EXAM: PORTABLE CHEST 1 VIEW COMPARISON:  12/27/2018 FINDINGS: No significant change in fairly extensive interstitial lung opacities. No significant effusion. Stable cardiomediastinal silhouette. No pneumothorax. IMPRESSION: Similar  appearance of fairly extensive bilateral interstitial opacities, some of which may be secondary to underlying chronic interstitial disease. Suspect that there is acute superimposed edema or interstitial inflammatory process/possible atypical pneumonia. Electronically Signed   By: Donavan Foil M.D.   On: 12/28/2018 00:52   Dg Chest Portable 1 View  Result Date: 12/27/2018 CLINICAL DATA:  Worsening shortness of breath, diminished breath sounds, COPD EXAM: PORTABLE CHEST 1 VIEW COMPARISON:  None available FINDINGS: Mild cardiomegaly with vascular and interstitial prominence. Difficult to exclude chronic interstitial lung disease versus developing mild edema. Background COPD also suspected. No large effusion or pneumothorax. No focal collapse or consolidation. Trachea midline. Aorta atherosclerotic and degenerative changes noted of the spine. IMPRESSION: Cardiomegaly with vascular and interstitial prominence, nonspecific for edema versus interstitial lung disease Background COPD/emphysema No focal collapse or consolidation Electronically Signed   By: Jerilynn Mages.  Shick M.D.   On: 12/27/2018 09:05      ASSESSMENT AND PLAN    -Multidisciplinary rounds held today  Acute Hypoxic Respiratory Failure -due to acute exacerbation of COPD -continue Bronchodilator Therapy -c/w empiric antibiotics  Demand ischemia   - s/p cardiology evaluation - appreicate input -TTE with significant decrement in LVEF  - no need for urgent cardiac cath -oxygen as needed -follow up cardiac enzymes as indicated ICU monitoring   ID -continue IV abx as prescibed -follow up cultures  GI/Nutrition GI PROPHYLAXIS as indicated DIET-->TF's as tolerated Constipation protocol as indicated  ENDO - ICU hypoglycemic\Hyperglycemia protocol -check FSBS per protocol   ELECTROLYTES -follow labs as needed -replace as needed -pharmacy consultation   DVT/GI PRX ordered -SCDs  TRANSFUSIONS AS NEEDED MONITOR FSBS ASSESS the need  for LABS as needed   Critical care provider statement:    Critical care time (minutes):  33   Critical care time was exclusive of:  Separately billable procedures and treating other patients   Critical care was necessary to treat or prevent imminent or life-threatening deterioration of the following conditions:  Acute exacerbation of COPD, demand ischemia, acute hypoxemic respiratory failure, mulitple comorbid conditions   Critical care was time spent personally by me on the following activities:  Development of treatment plan with patient or surrogate, discussions with consultants, evaluation of patient's response to treatment, examination of patient, obtaining history from patient or surrogate, ordering and performing treatments and interventions, ordering and review of laboratory studies and re-evaluation of patient's condition.  I assumed direction of critical care for this patient from another provider in my specialty: no    This document was prepared using Dragon voice recognition  software and may include unintentional dictation errors.    Ottie Glazier, M.D.  Division of [place]

## 2018-12-28 NOTE — Progress Notes (Signed)
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 Because of this, PRN ativan and morphine was ordered, along with the initiation of a precedex drip.    Patient is breathing 30 times a minute on the BiPAP at 50% FiO2.  Heart rate currently 90 but has been recorded as high as 130 during episodes of agitation.   Patient safety mitts placed to prevent removal of lines, wires, and tubes.   Matinence fluids stopped, and 20mg  of IV lasix was given once. Chest x-ray obtained at 0015 this morning.  Condom catheter was placed to record accurate output.   Patient currently resting comfortably. Will continue to monitor.  Cameron Ali, RN

## 2018-12-28 NOTE — Progress Notes (Signed)
[facility] [place] NAME: Theodore Obrien    MR#:  [id number]  DATE OF BIRTH:  [birth year]  SUBJECTIVE:  CHIEF COMPLAINT:  Pt is on Bipap, sob is not better   REVIEW OF SYSTEMS:  Lethargic  DRUG ALLERGIES:   Allergies  Allergen Reactions  . Zyvox [Linezolid]     Nerve damage     VITALS:  Blood pressure (!) 136/94, pulse 78, temperature 98.7 F (37.1 C), temperature source Axillary, resp. rate (!) 23, height 5\' 10"  (1.778 m), weight 85 kg, SpO2 94 %.  PHYSICAL EXAMINATION:  GENERAL:  68 y.o.-year-old patient lying in the bed with no acute distress.  EYES: Pupils equal, round, reactive to light and accommodation. No scleral icterus. Extraocular muscles intact.  HEENT: Head atraumatic, normocephalic. Oropharynx and nasopharynx clear.  NECK:  Supple, no jugular venous distention. No thyroid enlargement, no tenderness.  LUNGS: Normal breath sounds bilaterally, no wheezing, rales,rhonchi or crepitation. No use of accessory muscles of respiration.  CARDIOVASCULAR: S1, S2 normal. No murmurs, rubs, or gallops.  ABDOMEN: Soft, nontender, nondistended. Bowel sounds present.  EXTREMITIES: No pedal edema, cyanosis, or clubbing.  NEUROLOGIC: lethargic  PSYCHIATRIC: The patient is  arousable but lethargic  SKIN: No obvious rash, lesion, or ulcer.    LABORATORY PANEL:   CBC Recent Labs  Lab 12/28/18 0659  WBC 10.4  HGB 15.7  HCT 47.5  PLT 153   ------------------------------------------------------------------------------------------------------------------  Chemistries  Recent Labs  Lab 12/28/18 0659  NA 140  K 4.0  CL 105  CO2 25  GLUCOSE 213*  BUN 24*  CREATININE 0.92  CALCIUM 8.9  AST 33  ALT 44  ALKPHOS 72  BILITOT 1.7*   ------------------------------------------------------------------------------------------------------------------  Cardiac Enzymes No results for input(s): TROPONINI in the last 168  hours. ------------------------------------------------------------------------------------------------------------------  RADIOLOGY:  Dg Chest Port 1 View  Result Date: 12/28/2018 CLINICAL DATA:  Acute respiratory distress EXAM: PORTABLE CHEST 1 VIEW COMPARISON:  12/27/2018 FINDINGS: No significant change in fairly extensive interstitial lung opacities. No significant effusion. Stable cardiomediastinal silhouette. No pneumothorax. IMPRESSION: Similar appearance of fairly extensive bilateral interstitial opacities, some of which may be secondary to underlying chronic interstitial disease. Suspect that there is acute superimposed edema or interstitial inflammatory process/possible atypical pneumonia. Electronically Signed   By: Donavan Foil M.D.   On: 12/28/2018 00:52   Dg Chest Portable 1 View  Result Date: 12/27/2018 CLINICAL DATA:  Worsening shortness of breath, diminished breath sounds, COPD EXAM: PORTABLE CHEST 1 VIEW COMPARISON:  None available FINDINGS: Mild cardiomegaly with vascular and interstitial prominence. Difficult to exclude chronic interstitial lung disease versus developing mild edema. Background COPD also suspected. No large effusion or pneumothorax. No focal collapse or consolidation. Trachea midline. Aorta atherosclerotic and degenerative changes noted of the spine. IMPRESSION: Cardiomegaly with vascular and interstitial prominence, nonspecific for edema versus interstitial lung disease Background COPD/emphysema No focal collapse or consolidation Electronically Signed   By: Jerilynn Mages.  Shick M.D.   On: 12/27/2018 09:05    EKG:   Orders placed or performed during the hospital encounter of 12/27/18  . EKG 12-Lead  . EKG 12-Lead  . ED EKG  . ED EKG  . EKG 12-Lead  . EKG 12-Lead    ASSESSMENT AND PLAN:   # Acute hypoxic respiratory failure 2/2 COPD exacerbation BiPAP, clinically not better , may need intubation   Solu-Medrol 60 mg IV every 6 hours DuoNebs every 6 hours and  albuterol every 4 hours as needed for  shortness of breath and wheezing Rocephin  N.p.o. while patient is on BiPAP Discussed with dr.Aleskerov   #Acute bronchitis IV Rocephin and bronchodilator treatment   #Elevated troponin probably from demand ischemia Monitor patient on telemetry  echo Cardiology consult placed , seen by Dr. Montine Circle, No further invasive cardiac intervention at this time due to no anginal symptoms and no evidence of acute coronary syndrome with elevated troponin most consistent with demand ischemia  #Essential hypertension Currently patient is n.p.o. we will hold blood pressure medications   #Anxiety Currently patient is on BiPAP and n.p.o. IV Ativan as needed  #Tobacco abuse disorder Counseled patient to quit smoking at the time of admission    DVT prophylaxis with Lovenox subcu GI prophylaxis       All the records are reviewed and case discussed with Care Management/Social Workerr. Management plans discussed with the patient, family and they are in agreement.  CODE STATUS:   TOTAL TIME TAKING CARE OF THIS PATIENT: 36  minutes.   POSSIBLE D/C IN ? DAYS, DEPENDING ON CLINICAL CONDITION.  Note: This dictation was prepared with Dragon dictation along with smaller phrase technology. Any transcriptional errors that result from this process are unintentional.   Nicholes Mango M.D on 12/28/2018 at 11:47 AM  Between 7am to 6pm - Pager - [phone] After 6pm go to [email or web address] - password [email or web address] [facility]  Office  [phone]  CC: Primary care physician; Tracie Harrier, MD

## 2018-12-28 NOTE — Progress Notes (Signed)
*  PRELIMINARY RESULTS* Echocardiogram 2D Echocardiogram has been performed.  Sherrie Sport 12/28/2018, 9:26 AM

## 2018-12-29 LAB — PROCALCITONIN: Procalcitonin: <0.1 ng/mL

## 2018-12-29 LAB — BASIC METABOLIC PANEL
Anion gap: 10 (ref 5–15)
BUN: 28 mg/dL — ABNORMAL HIGH (ref 8–23)
CO2: 24 mmol/L (ref 22–32)
Calcium: 8.8 mg/dL — ABNORMAL LOW (ref 8.9–10.3)
Chloride: 102 mmol/L (ref 98–111)
Creatinine, Ser: 0.83 mg/dL (ref 0.61–1.24)
GFR calc Af Amer: >60 mL/min (ref >60)
GFR calc non Af Amer: >60 mL/min (ref >60)
Glucose, Bld: 157 mg/dL — ABNORMAL HIGH (ref 70–99)
Potassium: 4.1 mmol/L (ref 3.5–5.1)
Sodium: 136 mmol/L (ref 135–145)

## 2018-12-29 LAB — GLUCOSE, CAPILLARY
Glucose-Capillary: 127 mg/dL — ABNORMAL HIGH (ref 70–99)
Glucose-Capillary: 153 mg/dL — ABNORMAL HIGH (ref 70–99)
Glucose-Capillary: 164 mg/dL — ABNORMAL HIGH (ref 70–99)
Glucose-Capillary: 121 mg/dL — ABNORMAL HIGH (ref 70–99)

## 2018-12-29 LAB — HIV ANTIBODY (ROUTINE TESTING W REFLEX): HIV Screen 4th Generation wRfx: NONREACTIVE

## 2018-12-29 MED ORDER — FUROSEMIDE 10 MG/ML IJ SOLN
40.0000 mg | Freq: Every day | INTRAMUSCULAR | Status: DC
  Administered 2018-12-29 – 2018-12-31 (×3): 40 mg via INTRAVENOUS
  Filled 2018-12-29 (×3): qty 4

## 2018-12-29 MED ORDER — OXYCODONE HCL 5 MG PO TABS
5.0000 mg | ORAL_TABLET | Freq: Four times a day (QID) | ORAL | Status: DC | PRN
  Administered 2018-12-29 – 2018-12-31 (×4): 5 mg via ORAL
  Filled 2018-12-29 (×4): qty 1

## 2018-12-29 MED ORDER — ALPRAZOLAM 0.5 MG PO TABS
0.5000 mg | ORAL_TABLET | Freq: Three times a day (TID) | ORAL | Status: DC | PRN
  Administered 2018-12-29 – 2019-01-01 (×5): 0.5 mg via ORAL
  Filled 2018-12-29 (×4): qty 1

## 2018-12-29 MED ORDER — PANTOPRAZOLE SODIUM 40 MG PO TBEC
40.0000 mg | DELAYED_RELEASE_TABLET | Freq: Every day | ORAL | Status: DC
  Administered 2018-12-29 – 2019-01-01 (×4): 40 mg via ORAL
  Filled 2018-12-29 (×4): qty 1

## 2018-12-29 MED ORDER — ENOXAPARIN SODIUM 40 MG/0.4ML ~~LOC~~ SOLN
40.0000 mg | SUBCUTANEOUS | Status: DC
  Administered 2018-12-29 – 2018-12-30 (×2): 40 mg via SUBCUTANEOUS
  Filled 2018-12-29 (×2): qty 0.4

## 2018-12-29 MED ORDER — CLONIDINE HCL 0.1 MG PO TABS
0.2000 mg | ORAL_TABLET | Freq: Three times a day (TID) | ORAL | Status: DC
  Administered 2018-12-29 – 2019-01-01 (×7): 0.2 mg via ORAL
  Filled 2018-12-29 (×9): qty 2

## 2018-12-29 MED ORDER — CLONIDINE HCL 0.1 MG PO TABS
0.1000 mg | ORAL_TABLET | Freq: Three times a day (TID) | ORAL | Status: DC
  Administered 2018-12-29: 11:00:00 0.1 mg via ORAL
  Filled 2018-12-29: qty 1

## 2018-12-29 MED ORDER — HYDRALAZINE HCL 20 MG/ML IJ SOLN
10.0000 mg | Freq: Four times a day (QID) | INTRAMUSCULAR | Status: DC | PRN
  Administered 2018-12-29: 15:00:00 10 mg via INTRAVENOUS
  Filled 2018-12-29: qty 1

## 2018-12-29 NOTE — Progress Notes (Signed)
Pt complains of pain, MD notified for PO orders for pain medication. Orders received. I will continue to assess.

## 2018-12-29 NOTE — Progress Notes (Signed)
Pt transfers from CCU to room 243, VSS, telemetry box 18 placed on patient. I will continue to assess.

## 2018-12-29 NOTE — Progress Notes (Signed)
CRITICAL CARE PROGRESS NOTE    Name: Theodore Obrien MRN: [id number] DOB: [birth year]     LOS: 2   SUBJECTIVE FINDINGS & SIGNIFICANT EVENTS   Patient description:   68 yo male current everyday smoker hx admitted with acute hypoxic hypercapnic respiratory failure secondary to pulmonary edema and likely undiagnosed COPD requiring Bipap    Lines / Drains: PIV   Cultures / Sepsis markers: Blood cx nasl MRSA  sars cov neg  Antibiotics: zithromax rocephin  Protocols / Consultants: cardiology  Tests / Events: tte   -patient improved. Off BIPAP. Stopping precedex due to end of alcohol withdrawal period.   -Diuresis and transition to tele floor  PAST MEDICAL HISTORY   Past Medical History:  Diagnosis Date  . GERD (gastroesophageal reflux disease)   . Hypertension      SURGICAL HISTORY   Past Surgical History:  Procedure Laterality Date  . CARPAL TUNNEL RELEASE Left   . FEMORAL HERNIA REPAIR Left 10/27/2014   Procedure: Repair of left femoral and direct hernia with mesh ;  Surgeon: Sherri Rad, MD;  Location: [facility] ORS;  Service: General;  Laterality: Left;  . FRACTURE SURGERY     right leg     FAMILY HISTORY   Family History  Problem Relation Age of Onset  . Hypertension Mother   . Heart disease Mother   . Prostate cancer Father   . Leukemia Father      SOCIAL HISTORY   Social History   Tobacco Use  . Smoking status: Former Smoker    Packs/day: 1.00    Years: 6.00    Pack years: 6.00    Types: Cigarettes    Quit date: 2018    Years since quitting: 2.6  . Smokeless tobacco: Never Used  . Tobacco comment: quit smoking 2018  Substance Use Topics  . Alcohol use: Yes    Comment: occasionally beer  . Drug use: No     MEDICATIONS   Current Medication:  Current  Facility-Administered Medications:  .  albuterol (PROVENTIL) (2.5 MG/3ML) 0.083% nebulizer solution 2.5 mg, 2.5 mg, Nebulization, Q4H PRN, Gouru, Aruna, MD .  azithromycin (ZITHROMAX) tablet 500 mg, 500 mg, Oral, Daily, Gouru, Aruna, MD, 500 mg at 12/28/18 1710 .  budesonide (PULMICORT) nebulizer solution 0.5 mg, 0.5 mg, Nebulization, BID, Gouru, Aruna, MD, 0.5 mg at 12/29/18 0825 .  cefTRIAXone (ROCEPHIN) 1 g in sodium chloride 0.9 % 100 mL IVPB, 1 g, Intravenous, Q24H, Gouru, Aruna, MD, Last Rate: 200 mL/hr at 12/29/18 0508, 1 g at 12/29/18 0508 .  Chlorhexidine Gluconate Cloth 2 % PADS 6 each, 6 each, Topical, Daily, Awilda Bill, NP, 6 each at 12/28/18 1248 .  cloNIDine (CATAPRES) tablet 0.1 mg, 0.1 mg, Oral, TID, Lanney Gins, Jaemarie Hochberg, MD .  diltiazem (CARDIZEM) injection 10 mg, 10 mg, Intravenous, Once, Tukov-Yual, Magdalene S, NP, Stopped at 12/28/18 1935 .  enoxaparin (LOVENOX) injection 40 mg, 40 mg, Subcutaneous, Q24H, Gouru, Aruna, MD, 40 mg at 12/28/18 1706 .  folic acid (FOLVITE) tablet 1 mg, 1 mg, Oral, Daily, Gouru, Aruna, MD, 1 mg at 12/28/18 1705 .  furosemide (LASIX) injection 40 mg, 40 mg, Intravenous, Daily, Faige Seely, MD .  insulin aspart (novoLOG) injection 0-15 Units, 0-15 Units, Subcutaneous, TID WC, Gouru, Aruna, MD, 3 Units at 12/28/18 1706 .  insulin aspart (novoLOG) injection 0-5 Units, 0-5 Units, Subcutaneous, QHS, Gouru, Aruna, MD .  ipratropium-albuterol (DUONEB) 0.5-2.5 (3) MG/3ML nebulizer solution 3 mL, 3 mL, Nebulization, Q6H, Gouru, Aruna, MD,  3 mL at 12/29/18 0825 .  LORazepam (ATIVAN) injection 2-3 mg, 2-3 mg, Intravenous, Q1H PRN, Darel Hong D, NP, 2 mg at 12/27/18 2337 .  MEDLINE mouth rinse, 15 mL, Mouth Rinse, BID, Darel Hong D, NP, 15 mL at 12/28/18 2139 .  methylPREDNISolone sodium succinate (SOLU-MEDROL) 40 mg/mL injection 40 mg, 40 mg, Intravenous, Q12H, Gouru, Aruna, MD, 40 mg at 12/28/18 2305 .  morphine 2 MG/ML injection 2 mg, 2 mg,  Intravenous, Q3H PRN, Darel Hong D, NP, 2 mg at 12/29/18 0809 .  multivitamin with minerals tablet 1 tablet, 1 tablet, Oral, Daily, Gouru, Aruna, MD, 1 tablet at 12/28/18 1705 .  nicotine (NICODERM CQ - dosed in mg/24 hours) patch 14 mg, 14 mg, Transdermal, Daily, Awilda Bill, NP, 14 mg at 12/28/18 1248 .  ondansetron (ZOFRAN) tablet 4 mg, 4 mg, Oral, Q6H PRN **OR** ondansetron (ZOFRAN) injection 4 mg, 4 mg, Intravenous, Q6H PRN, Gouru, Aruna, MD .  pantoprazole (PROTONIX) EC tablet 40 mg, 40 mg, Oral, Daily, Dallie Piles, RPH .  thiamine (VITAMIN B-1) tablet 100 mg, 100 mg, Oral, Daily, Gouru, Aruna, MD, 100 mg at 12/28/18 1706    ALLERGIES   Zyvox [linezolid]    REVIEW OF SYSTEMS    10 point ros done and is neg except as per subj findings  PHYSICAL EXAMINATION   Vital Signs: Temp:  [97.8 F (36.6 C)-99 F (37.2 C)] 99 F (37.2 C) (08/29 0800) Pulse Rate:  [68-94] 87 (08/29 1000) Resp:  [12-27] 15 (08/29 1000) BP: (114-151)/(55-105) 144/93 (08/29 1000) SpO2:  [89 %-98 %] 95 % (08/29 1000) FiO2 (%):  [40 %] 40 % (08/28 1505)  GENERAL:mild distress due to AECOPD HEAD: Normocephalic, atraumatic.  EYES: Pupils equal, round, reactive to light.  No scleral icterus.  MOUTH: Moist mucosal membrane. NECK: Supple. No thyromegaly. No nodules. No JVD.  PULMONARY: rhonchi bilaterally  CARDIOVASCULAR: S1 and S2. Regular rate and rhythm. No murmurs, rubs, or gallops.  GASTROINTESTINAL: Soft, nontender, non-distended. No masses. Positive bowel sounds. No hepatosplenomegaly.  MUSCULOSKELETAL: No swelling, clubbing, or edema.  NEUROLOGIC: Mild distress due to acute illness SKIN:intact,warm,dry   PERTINENT DATA     Infusions: . cefTRIAXone (ROCEPHIN)  IV 1 g (12/29/18 0508)   Scheduled Medications: . azithromycin  500 mg Oral Daily  . budesonide (PULMICORT) nebulizer solution  0.5 mg Nebulization BID  . Chlorhexidine Gluconate Cloth  6 each Topical Daily  .  cloNIDine  0.1 mg Oral TID  . diltiazem  10 mg Intravenous Once  . enoxaparin (LOVENOX) injection  40 mg Subcutaneous Q24H  . folic acid  1 mg Oral Daily  . furosemide  40 mg Intravenous Daily  . insulin aspart  0-15 Units Subcutaneous TID WC  . insulin aspart  0-5 Units Subcutaneous QHS  . ipratropium-albuterol  3 mL Nebulization Q6H  . mouth rinse  15 mL Mouth Rinse BID  . methylPREDNISolone (SOLU-MEDROL) injection  40 mg Intravenous Q12H  . multivitamin with minerals  1 tablet Oral Daily  . nicotine  14 mg Transdermal Daily  . pantoprazole  40 mg Oral Daily  . thiamine  100 mg Oral Daily   PRN Medications: albuterol, LORazepam, morphine injection, ondansetron **OR** ondansetron (ZOFRAN) IV Hemodynamic parameters:   Intake/Output: 08/28 0701 - 08/29 0700 In: 512.6 [P.O.:240; I.V.:272.6] Out: 1650 [id number]  Ventilator  Settings: FiO2 (%):  [40 %] 40 %   Other Labs:  LAB RESULTS:  Basic Metabolic Panel: Recent Labs  Lab 12/27/18 0836 12/28/18  [id number] 12/28/18 1934 12/29/18 0650  NA 138 140  --  136  K 4.7 4.0 4.3 4.1  CL 102 105  --  102  CO2 21* 25  --  24  GLUCOSE 268* 213*  --  157*  BUN 17 24*  --  28*  CREATININE 1.05 0.92  --  0.83  CALCIUM 8.7* 8.9  --  8.8*  MG  --   --  2.1  --   PHOS  --   --  2.8  --    Liver Function Tests: Recent Labs  Lab 12/27/18 0836 12/28/18 0659  AST 84* 33  ALT 54* 44  ALKPHOS 85 72  BILITOT 1.6* 1.7*  PROT 6.8 6.5  ALBUMIN 3.8 3.6   No results for input(s): LIPASE, AMYLASE in the last 168 hours. No results for input(s): AMMONIA in the last 168 hours. CBC: Recent Labs  Lab 12/27/18 0836 12/28/18 0659  WBC 13.0* 10.4  NEUTROABS 7.0 9.0*  HGB 16.7 15.7  HCT 52.4* 47.5  MCV 90.2 86.2  PLT 239 153   Cardiac Enzymes: No results for input(s): CKTOTAL, CKMB, CKMBINDEX, TROPONINI in the last 168 hours. BNP: Invalid input(s): POCBNP CBG: Recent Labs  Lab 12/28/18 0829 12/28/18 1156 12/28/18 1639 12/28/18  2148 12/29/18 0752  GLUCAP 209* 167* 157* 143* 127*     IMAGING RESULTS:  Imaging: Dg Chest Port 1 View  Result Date: 12/28/2018 CLINICAL DATA:  Acute respiratory distress EXAM: PORTABLE CHEST 1 VIEW COMPARISON:  12/27/2018 FINDINGS: No significant change in fairly extensive interstitial lung opacities. No significant effusion. Stable cardiomediastinal silhouette. No pneumothorax. IMPRESSION: Similar appearance of fairly extensive bilateral interstitial opacities, some of which may be secondary to underlying chronic interstitial disease. Suspect that there is acute superimposed edema or interstitial inflammatory process/possible atypical pneumonia. Electronically Signed   By: Donavan Foil M.D.   On: 12/28/2018 00:52      ASSESSMENT AND PLAN    -Multidisciplinary rounds held today  Acute Hypoxic Respiratory Failure -due to acute exacerbation of COPD -continue Bronchodilator Therapy -c/w empiric antibiotics -off BIPAP now - Lasix for pulm edema  Demand ischemia   - s/p cardiology evaluation - appreicate input -TTE with significant decrement in LVEF  - no need for urgent cardiac cath -oxygen as needed -follow up cardiac enzymes as indicated ICU monitoring -will consider heparin for NSTEM and demand ischemia as per Dr Nehemiah Massed.   ID -continue IV abx as prescibed -follow up cultures  GI/Nutrition GI PROPHYLAXIS as indicated DIET-->TF's as tolerated Constipation protocol as indicated  ENDO - ICU hypoglycemic\Hyperglycemia protocol -check FSBS per protocol   ELECTROLYTES -follow labs as needed -replace as needed -pharmacy consultation   DVT/GI PRX ordered -SCDs  TRANSFUSIONS AS NEEDED MONITOR FSBS ASSESS the need for LABS as needed   Critical care provider statement:    Critical care time (minutes):  33   Critical care time was exclusive of:  Separately billable procedures and treating other patients   Critical care was necessary to treat or prevent  imminent or life-threatening deterioration of the following conditions:  Acute exacerbation of COPD, demand ischemia, acute hypoxemic respiratory failure, mulitple comorbid conditions   Critical care was time spent personally by me on the following activities:  Development of treatment plan with patient or surrogate, discussions with consultants, evaluation of patient's response to treatment, examination of patient, obtaining history from patient or surrogate, ordering and performing treatments and interventions, ordering and review of laboratory studies and re-evaluation of patient's condition.  I assumed direction of critical care for this patient from another provider in my specialty: no    This document was prepared using Dragon voice recognition software and may include unintentional dictation errors.    Ottie Glazier, M.D.  Division of [place]

## 2018-12-29 NOTE — Progress Notes (Signed)
[facility] Encounter Note  Patient: Theodore Obrien / Admit Date: 12/27/2018 / Date of Encounter: 12/29/2018, 9:37 AM   Subjective: Patient continues to have respiratory failure with hypoxia requiring supplemental oxygen.  No current evidence of chest discomfort or anginal symptoms.  Patient does have an elevated troponin change more consistent with non-ST elevation myocardial infarction possibly from coronary artery disease and or from hypoxia rather than acute coronary syndrome.  EKG is abnormal now changing with normal sinus rhythm with left anterior fascicular block and diffuse T wave inversion.  Echocardiogram showing global LV systolic dysfunction with ejection fraction of 30% BNP has peaked above 2000 and troponin at 485 Chest x-ray suggests diffuse pulmonary infiltrate although some edema as well which may be improved with Lasix Review of Systems: Positive for: Shortness of breath Negative for: Vision change, hearing change, syncope, dizziness, nausea, vomiting,diarrhea, bloody stool, stomach pain, cough, congestion, diaphoresis, urinary frequency, urinary pain,skin lesions, skin rashes Others previously listed  Objective: Telemetry: Sinus tachycardia Physical Exam: Blood pressure (!) 144/96, pulse 78, temperature 99 F (37.2 C), temperature source Oral, resp. rate 17, height 5\' 10"  (1.778 m), weight 85 kg, SpO2 94 %. Body mass index is 26.89 kg/m. General: Well developed, well nourished, in no acute distress. Head: Normocephalic, atraumatic, sclera non-icteric, no xanthomas, nares are without discharge. Neck: No apparent masses Lungs: Normal respirations with diffuse  wheezes, some rhonchi, no rales , some crackles   Heart: Regular rate and rhythm, normal S1 S2, no murmur, no rub, no gallop, PMI is normal size and placement, carotid upstroke normal without bruit, jugular venous pressure normal Abdomen: Soft, non-tender, non-distended with normoactive bowel  sounds. No hepatosplenomegaly. Abdominal aorta is normal size without bruit Extremities: No edema, no clubbing, no cyanosis, no ulcers,  Peripheral: 2+ radial, 2+ femoral, 2+ dorsal pedal pulses Neuro: Alert and oriented. Moves all extremities spontaneously. Psych:  Responds to questions appropriately with a normal affect.   Intake/Output Summary (Last 24 hours) at 12/29/2018 [id number] Last data filed at 12/29/2018 0800 Gross per 24 hour  Intake 574.69 ml  Output 2100 ml  Net -1525.31 ml    Inpatient Medications:  . azithromycin  500 mg Oral Daily  . budesonide (PULMICORT) nebulizer solution  0.5 mg Nebulization BID  . Chlorhexidine Gluconate Cloth  6 each Topical Daily  . diltiazem  10 mg Intravenous Once  . enoxaparin (LOVENOX) injection  40 mg Subcutaneous Q24H  . folic acid  1 mg Oral Daily  . insulin aspart  0-15 Units Subcutaneous TID WC  . insulin aspart  0-5 Units Subcutaneous QHS  . ipratropium-albuterol  3 mL Nebulization Q6H  . mouth rinse  15 mL Mouth Rinse BID  . methylPREDNISolone (SOLU-MEDROL) injection  40 mg Intravenous Q12H  . multivitamin with minerals  1 tablet Oral Daily  . nicotine  14 mg Transdermal Daily  . pantoprazole  40 mg Oral Daily  . thiamine  100 mg Oral Daily   Infusions:  . cefTRIAXone (ROCEPHIN)  IV 1 g (12/29/18 0508)  . dexmedetomidine (PRECEDEX) IV infusion 0.4 mcg/kg/hr (12/29/18 0510)    Labs: Recent Labs    12/28/18 0659 12/28/18 1934 12/29/18 0650  NA 140  --  136  K 4.0 4.3 4.1  CL 105  --  102  CO2 25  --  24  GLUCOSE 213*  --  157*  BUN 24*  --  28*  CREATININE 0.92  --  0.83  CALCIUM 8.9  --  8.8*  MG  --  2.1  --   PHOS  --  2.8  --    Recent Labs    12/27/18 0836 12/28/18 0659  AST 84* 33  ALT 54* 44  ALKPHOS 85 72  BILITOT 1.6* 1.7*  PROT 6.8 6.5  ALBUMIN 3.8 3.6   Recent Labs    12/27/18 0836 12/28/18 0659  WBC 13.0* 10.4  NEUTROABS 7.0 9.0*  HGB 16.7 15.7  HCT 52.4* 47.5  MCV 90.2 86.2  PLT 239 153    No results for input(s): CKTOTAL, CKMB, TROPONINI in the last 72 hours. Invalid input(s): POCBNP Recent Labs    12/27/18 1253  HGBA1C 5.9*     Weights: Filed Weights   12/27/18 0837  Weight: 85 kg     Radiology/Studies:  Dg Chest Port 1 View  Result Date: 12/28/2018 CLINICAL DATA:  Acute respiratory distress EXAM: PORTABLE CHEST 1 VIEW COMPARISON:  12/27/2018 FINDINGS: No significant change in fairly extensive interstitial lung opacities. No significant effusion. Stable cardiomediastinal silhouette. No pneumothorax. IMPRESSION: Similar appearance of fairly extensive bilateral interstitial opacities, some of which may be secondary to underlying chronic interstitial disease. Suspect that there is acute superimposed edema or interstitial inflammatory process/possible atypical pneumonia. Electronically Signed   By: Donavan Foil M.D.   On: 12/28/2018 00:52   Dg Chest Portable 1 View  Result Date: 12/27/2018 CLINICAL DATA:  Worsening shortness of breath, diminished breath sounds, COPD EXAM: PORTABLE CHEST 1 VIEW COMPARISON:  None available FINDINGS: Mild cardiomegaly with vascular and interstitial prominence. Difficult to exclude chronic interstitial lung disease versus developing mild edema. Background COPD also suspected. No large effusion or pneumothorax. No focal collapse or consolidation. Trachea midline. Aorta atherosclerotic and degenerative changes noted of the spine. IMPRESSION: Cardiomegaly with vascular and interstitial prominence, nonspecific for edema versus interstitial lung disease Background COPD/emphysema No focal collapse or consolidation Electronically Signed   By: Jerilynn Mages.  Shick M.D.   On: 12/27/2018 09:05     Assessment and Recommendation  68 y.o. male with diabetes hypertension hyperlipidemia and acute respiratory failure with elevated troponin consistent with non-ST elevation myocardial infarction from coronary artery disease LV systolic dysfunction and demand ischemia  rather than acute coronary syndrome with moderate global LV systolic dysfunction multifactorial in nature continuing to need further medical management 1.  Continue respiratory failure treatment with appropriate antibiotics inhalers pulmonary toilet and oxygen supplementation 2.  Further consideration of cardiac catheterization after recovery from respiratory failure hypoxia due to LV systolic dysfunction and elevated troponin for risk stratification and further treatment options.  Patient understands risk and benefits of cardiac catheterization this includes the possibility of death stroke heart attack infection bleeding or blood clot.  He is lower risk for conscious sedation  3.  Continue medication management for risk factors with hypertension control lipid management and diabetes treatment as per above 4.  Would consider heparin for non-ST elevation myocardial infarction and demand ischemia continuing at this time 5.  Consideration of dose of furosemide for possible mild pulmonary edema by chest x-ray and continued hypoxia Signed, Serafina Royals M.D. FACC

## 2018-12-29 NOTE — Progress Notes (Signed)
[facility] [place] NAME: Theodore Obrien    MR#:  [id number]  DATE OF BIRTH:  [birth year]  SUBJECTIVE:  CHIEF COMPLAINT: No new complaint this morning.  Patient already weaned off BiPAP to oxygen via nasal cannula.  Requiring Precedex drip this morning due to concern for alcohol withdrawal   REVIEW OF SYSTEMS:  Lethargic  DRUG ALLERGIES:   Allergies  Allergen Reactions  . Zyvox [Linezolid]     Nerve damage     VITALS:  Blood pressure (!) 169/109, pulse (!) 102, temperature 99 F (37.2 C), temperature source Oral, resp. rate (!) 28, height 5\' 10"  (1.778 m), weight 85 kg, SpO2 95 %.  PHYSICAL EXAMINATION:  GENERAL:  68 y.o.-year-old patient lying in the bed with no acute distress.  EYES: Pupils equal, round, reactive to light and accommodation. No scleral icterus. Extraocular muscles intact.  HEENT: Head atraumatic, normocephalic. Oropharynx and nasopharynx clear.  NECK:  Supple, no jugular venous distention. No thyroid enlargement, no tenderness.  LUNGS: Normal breath sounds bilaterally, no wheezing, rales,rhonchi or crepitation. No use of accessory muscles of respiration.  CARDIOVASCULAR: S1, S2 normal. No murmurs, rubs, or gallops.  ABDOMEN: Soft, nontender, nondistended. Bowel sounds present.  EXTREMITIES: No pedal edema, cyanosis, or clubbing.  NEUROLOGIC: lethargic  PSYCHIATRIC: The patient is  arousable but lethargic  SKIN: No obvious rash, lesion, or ulcer.    LABORATORY PANEL:   CBC Recent Labs  Lab 12/28/18 0659  WBC 10.4  HGB 15.7  HCT 47.5  PLT 153   ------------------------------------------------------------------------------------------------------------------  Chemistries  Recent Labs  Lab 12/28/18 0659 12/28/18 1934 12/29/18 0650  NA 140  --  136  K 4.0 4.3 4.1  CL 105  --  102  CO2 25  --  24  GLUCOSE 213*  --  157*  BUN 24*  --  28*  CREATININE 0.92  --  0.83  CALCIUM 8.9  --  8.8*  MG   --  2.1  --   AST 33  --   --   ALT 44  --   --   ALKPHOS 72  --   --   BILITOT 1.7*  --   --    ------------------------------------------------------------------------------------------------------------------  Cardiac Enzymes No results for input(s): TROPONINI in the last 168 hours. ------------------------------------------------------------------------------------------------------------------  RADIOLOGY:  Dg Chest Port 1 View  Result Date: 12/28/2018 CLINICAL DATA:  Acute respiratory distress EXAM: PORTABLE CHEST 1 VIEW COMPARISON:  12/27/2018 FINDINGS: No significant change in fairly extensive interstitial lung opacities. No significant effusion. Stable cardiomediastinal silhouette. No pneumothorax. IMPRESSION: Similar appearance of fairly extensive bilateral interstitial opacities, some of which may be secondary to underlying chronic interstitial disease. Suspect that there is acute superimposed edema or interstitial inflammatory process/possible atypical pneumonia. Electronically Signed   By: Donavan Foil M.D.   On: 12/28/2018 00:52    EKG:   Orders placed or performed during the hospital encounter of 12/27/18  . EKG 12-Lead  . EKG 12-Lead  . ED EKG  . ED EKG  . EKG 12-Lead  . EKG 12-Lead  . EKG 12-Lead  . EKG 12-Lead    ASSESSMENT AND PLAN:   # Acute hypoxic respiratory failure 2/2 COPD exacerbation Patient already weaned off BiPAP. Continue IV steroids with Solu-Medrol.  DuoNeb.  Empiric antibiotics with Rocephin. Being managed by intensivist.  #Acute bronchitis IV Rocephin and bronchodilator treatment   #Non-ST MI elevation  Reviewed cardiologist note Dr. Nehemiah Massed from today.  He documented  elevated troponin consistent with non-ST MI elevation.  Plans for consideration of cardiac catheterization after recovery of respiratory failure. Will defer management with heparin drip to cardiologist. 2D echocardiogram done with ejection fraction of 30 to 35%.  Left  ventricular hypokinesis with severe hypokinesis of the anterior, apical, lateral regions and mid to distal inferior walls. Patient currently on IV Lasix.  #Essential hypertension Blood pressure trending up And IV hydralazine as needed for systolic blood pressure greater than 160.  Patient on clonidine.  #Anxiety Stable this time.  #Tobacco abuse disorder Counseled patient to quit smoking at the time of admission    #History of alcohol abuse Patient on alcohol withdrawal protocol. Was requiring Precedex this morning by intensivist. Continue thiamine, multivitamin and folic acid.  DVT prophylaxis with Lovenox subcu   All the records are reviewed and case discussed with Care Management/Social Workerr. Management plans discussed with the patient, family and they are in agreement.  CODE STATUS:   TOTAL TIME TAKING CARE OF THIS PATIENT: 37  minutes.   POSSIBLE D/C IN 3 DAYS, DEPENDING ON CLINICAL CONDITION.  Note: This dictation was prepared with Dragon dictation along with smaller phrase technology. Any transcriptional errors that result from this process are unintentional.   Masyn Fullam M.D on 12/29/2018 at 2:26 PM  Between 7am to 6pm - Pager - [phone] After 6pm go to [email or web address] - password [email or web address] [facility]  Office  [phone]  CC: Primary care physician; Tracie Harrier, MD

## 2018-12-30 LAB — GLUCOSE, CAPILLARY
Glucose-Capillary: 164 mg/dL — ABNORMAL HIGH (ref 70–99)
Glucose-Capillary: 246 mg/dL — ABNORMAL HIGH (ref 70–99)
Glucose-Capillary: 149 mg/dL — ABNORMAL HIGH (ref 70–99)
Glucose-Capillary: 227 mg/dL — ABNORMAL HIGH (ref 70–99)

## 2018-12-30 MED ORDER — IPRATROPIUM-ALBUTEROL 0.5-2.5 (3) MG/3ML IN SOLN
3.0000 mL | Freq: Two times a day (BID) | RESPIRATORY_TRACT | Status: DC
  Administered 2018-12-31 – 2019-01-01 (×2): 3 mL via RESPIRATORY_TRACT
  Filled 2018-12-30 (×3): qty 3

## 2018-12-30 MED ORDER — MORPHINE SULFATE (PF) 4 MG/ML IV SOLN
INTRAVENOUS | Status: AC
  Filled 2018-12-30: qty 1

## 2018-12-30 MED ORDER — SODIUM CHLORIDE 0.9% FLUSH
3.0000 mL | Freq: Two times a day (BID) | INTRAVENOUS | Status: DC
  Administered 2018-12-30 (×2): 3 mL via INTRAVENOUS

## 2018-12-30 NOTE — Progress Notes (Signed)
[facility] at [place] NAME: Theodore Obrien    MR#:  [id number]  DATE OF BIRTH:  Nov 21, 1950  SUBJECTIVE:  CHIEF COMPLAINT:  No new complaint this morning.  Patient previously weaned off BiPAP and Precedex and transferred out of ICU.  More awake and alert this morning.  REVIEW OF SYSTEMS:  Review of Systems  Constitutional: Negative for chills and fever.  HENT: Negative for hearing loss and tinnitus.   Eyes: Negative for blurred vision and double vision.  Respiratory: Negative for cough and shortness of breath.   Cardiovascular: Negative for chest pain and palpitations.  Gastrointestinal: Negative for heartburn and nausea.  Genitourinary: Negative for dysuria and urgency.  Musculoskeletal: Negative for myalgias and neck pain.  Skin: Negative for itching and rash.  Neurological: Negative for dizziness and headaches.  Psychiatric/Behavioral: Negative for depression and hallucinations.   DRUG ALLERGIES:   Allergies  Allergen Reactions  . Zyvox [Linezolid]     Nerve damage     VITALS:  Blood pressure 117/86, pulse 89, temperature 98.3 F (36.8 C), temperature source Oral, resp. rate 18, height 5\' 10"  (1.778 m), weight 71.4 kg, SpO2 94 %.  PHYSICAL EXAMINATION:  GENERAL:  68 y.o.-year-old patient lying in the bed with no acute distress.  EYES: Pupils equal, round, reactive to light and accommodation. No scleral icterus. Extraocular muscles intact.  HEENT: Head atraumatic, normocephalic. Oropharynx and nasopharynx clear.  NECK:  Supple, no jugular venous distention. No thyroid enlargement, no tenderness.  LUNGS: Normal breath sounds bilaterally, no wheezing, rales,rhonchi or crepitation. No use of accessory muscles of respiration.  CARDIOVASCULAR: S1, S2 normal. No murmurs, rubs, or gallops.  ABDOMEN: Soft, nontender, nondistended. Bowel sounds present.  EXTREMITIES: No pedal edema, cyanosis, or clubbing.  NEUROLOGIC: Awake and  alert and oriented.  Generalized weakness.  PSYCHIATRIC: The patient awake and alert and oriented   SKIN: No obvious rash, lesion, or ulcer.    LABORATORY PANEL:   CBC Recent Labs  Lab 12/28/18 0659  WBC 10.4  HGB 15.7  HCT 47.5  PLT 153   ------------------------------------------------------------------------------------------------------------------  Chemistries  Recent Labs  Lab 12/28/18 0659 12/28/18 1934 12/29/18 0650  NA 140  --  136  K 4.0 4.3 4.1  CL 105  --  102  CO2 25  --  24  GLUCOSE 213*  --  157*  BUN 24*  --  28*  CREATININE 0.92  --  0.83  CALCIUM 8.9  --  8.8*  MG  --  2.1  --   AST 33  --   --   ALT 44  --   --   ALKPHOS 72  --   --   BILITOT 1.7*  --   --    ------------------------------------------------------------------------------------------------------------------  Cardiac Enzymes No results for input(s): TROPONINI in the last 168 hours. ------------------------------------------------------------------------------------------------------------------  RADIOLOGY:  No results found.  EKG:   Orders placed or performed during the hospital encounter of 12/27/18  . EKG 12-Lead  . EKG 12-Lead  . ED EKG  . ED EKG  . EKG 12-Lead  . EKG 12-Lead  . EKG 12-Lead  . EKG 12-Lead    ASSESSMENT AND PLAN:   # Acute hypoxic respiratory failure 2/2 COPD exacerbation Patient already weaned off BiPAP. Continue IV steroids with Solu-Medrol.  Plans to transition to p.o. prednisone taper on discharge DuoNeb.  Empiric antibiotics with Rocephin. Being managed by intensivist.  #Acute bronchitis IV Rocephin and bronchodilator treatment  #Non-ST MI  elevation  Patient being followed by cardiologist Dr. Nehemiah Massed. Elevated troponin consistent with non-ST MI elevation.  I discussed case with cardiologist today.  Since patient has remained stable no indication for initiation of heparin drip.  Plans for cardiac catheterization tomorrow  2D  echocardiogram done with ejection fraction of 30 to 35%.  Left ventricular hypokinesis with severe hypokinesis of the anterior, apical, lateral regions and mid to distal inferior walls. Patient currently on IV Lasix.  #Essential hypertension Blood pressure trending up And IV hydralazine as needed for systolic blood pressure greater than 160.  Patient on clonidine.  #Anxiety Stable this time.  #Tobacco abuse disorder Counseled patient to quit smoking at the time of admission    #History of alcohol abuse with alcohol withdrawal.  Resolved Patient on alcohol withdrawal protocol.  Previously weaned off Precedex and transferred out of ICU. Continue thiamine, multivitamin and folic acid.  DVT prophylaxis with Lovenox  All the records are reviewed and case discussed with Care Management/Social Workerr. Management plans discussed with the patient, family and they are in agreement. Called patient sister listed in the chart Ms. Langley Gauss; I updated her on treatment plans as outlined above and all questions were answered.  She is in agreement to the plan of care.  CODE STATUS:   TOTAL TIME TAKING CARE OF THIS PATIENT: 35  minutes.   POSSIBLE D/C IN 2DAYS, DEPENDING ON CLINICAL CONDITION.  Note: This dictation was prepared with Dragon dictation along with smaller phrase technology. Any transcriptional errors that result from this process are unintentional.   Shaquela Weichert M.D on 12/30/2018 at 1:19 PM  Between 7am to 6pm - Pager - [phone] After 6pm go to [email or web address] - password [email or web address] [facility]  Office  [phone]  CC: Primary care physician; Tracie Harrier, MD

## 2018-12-30 NOTE — Progress Notes (Signed)
[facility] Encounter Note  Patient: Theodore Obrien / Admit Date: 12/27/2018 / Date of Encounter: 12/30/2018, 6:44 AM   Subjective: Patient continues to have respiratory failure with hypoxia requiring supplemental oxygen.  No current evidence of chest discomfort or anginal symptoms.  Patient does have an elevated troponin change more consistent with non-ST elevation myocardial infarction possibly from coronary artery disease and or from hypoxia rather than acute coronary syndrome.  EKG is abnormal now changing with normal sinus rhythm with left anterior fascicular block and diffuse T wave inversion.  Echocardiogram showing global LV systolic dysfunction with ejection fraction of 30% BNP has peaked above 2000 and troponin at 485 Chest x-ray suggests diffuse pulmonary infiltrate although some edema as well which may be improved with Lasix Review of Systems: Positive for: Shortness of breath Negative for: Vision change, hearing change, syncope, dizziness, nausea, vomiting,diarrhea, bloody stool, stomach pain, cough, congestion, diaphoresis, urinary frequency, urinary pain,skin lesions, skin rashes Others previously listed  Objective: Telemetry: Sinus tachycardia Physical Exam: Blood pressure (!) 146/103, pulse 91, temperature 98.3 F (36.8 C), temperature source Oral, resp. rate 18, height 5\' 10"  (1.778 m), weight 71.4 kg, SpO2 94 %. Body mass index is 22.59 kg/m. General: Well developed, well nourished, in no acute distress. Head: Normocephalic, atraumatic, sclera non-icteric, no xanthomas, nares are without discharge. Neck: No apparent masses Lungs: Normal respirations with diffuse  wheezes, some rhonchi, no rales , some crackles   Heart: Regular rate and rhythm, normal S1 S2, no murmur, no rub, no gallop, PMI is normal size and placement, carotid upstroke normal without bruit, jugular venous pressure normal Abdomen: Soft, non-tender, non-distended with normoactive bowel  sounds. No hepatosplenomegaly. Abdominal aorta is normal size without bruit Extremities: No edema, no clubbing, no cyanosis, no ulcers,  Peripheral: 2+ radial, 2+ femoral, 2+ dorsal pedal pulses Neuro: Alert and oriented. Moves all extremities spontaneously. Psych:  Responds to questions appropriately with a normal affect.   Intake/Output Summary (Last 24 hours) at 12/30/2018 0644 Last data filed at 12/29/2018 1852 Gross per 24 hour  Intake 554.06 ml  Output 2850 ml  Net -2295.94 ml    Inpatient Medications:  . azithromycin  500 mg Oral Daily  . budesonide (PULMICORT) nebulizer solution  0.5 mg Nebulization BID  . Chlorhexidine Gluconate Cloth  6 each Topical Daily  . cloNIDine  0.2 mg Oral TID  . diltiazem  10 mg Intravenous Once  . enoxaparin (LOVENOX) injection  40 mg Subcutaneous Q24H  . folic acid  1 mg Oral Daily  . furosemide  40 mg Intravenous Daily  . insulin aspart  0-15 Units Subcutaneous TID WC  . insulin aspart  0-5 Units Subcutaneous QHS  . ipratropium-albuterol  3 mL Nebulization Q6H  . mouth rinse  15 mL Mouth Rinse BID  . methylPREDNISolone (SOLU-MEDROL) injection  40 mg Intravenous Q12H  . morphine      . multivitamin with minerals  1 tablet Oral Daily  . nicotine  14 mg Transdermal Daily  . pantoprazole  40 mg Oral Daily  . thiamine  100 mg Oral Daily   Infusions:  . cefTRIAXone (ROCEPHIN)  IV 1 g (12/30/18 0533)    Labs: Recent Labs    12/28/18 0659 12/28/18 1934 12/29/18 0650  NA 140  --  136  K 4.0 4.3 4.1  CL 105  --  102  CO2 25  --  24  GLUCOSE 213*  --  157*  BUN 24*  --  28*  CREATININE 0.92  --  0.83  CALCIUM 8.9  --  8.8*  MG  --  2.1  --   PHOS  --  2.8  --    Recent Labs    12/27/18 0836 12/28/18 0659  AST 84* 33  ALT 54* 44  ALKPHOS 85 72  BILITOT 1.6* 1.7*  PROT 6.8 6.5  ALBUMIN 3.8 3.6   Recent Labs    12/27/18 0836 12/28/18 0659  WBC 13.0* 10.4  NEUTROABS 7.0 9.0*  HGB 16.7 15.7  HCT 52.4* 47.5  MCV 90.2 86.2   PLT 239 153   No results for input(s): CKTOTAL, CKMB, TROPONINI in the last 72 hours. Invalid input(s): POCBNP Recent Labs    12/27/18 1253  HGBA1C 5.9*     Weights: Filed Weights   12/27/18 [id number] 12/30/18 0623  Weight: 85 kg 71.4 kg     Radiology/Studies:  Dg Chest Port 1 View  Result Date: 12/28/2018 CLINICAL DATA:  Acute respiratory distress EXAM: PORTABLE CHEST 1 VIEW COMPARISON:  12/27/2018 FINDINGS: No significant change in fairly extensive interstitial lung opacities. No significant effusion. Stable cardiomediastinal silhouette. No pneumothorax. IMPRESSION: Similar appearance of fairly extensive bilateral interstitial opacities, some of which may be secondary to underlying chronic interstitial disease. Suspect that there is acute superimposed edema or interstitial inflammatory process/possible atypical pneumonia. Electronically Signed   By: Donavan Foil M.D.   On: 12/28/2018 00:52   Dg Chest Portable 1 View  Result Date: 12/27/2018 CLINICAL DATA:  Worsening shortness of breath, diminished breath sounds, COPD EXAM: PORTABLE CHEST 1 VIEW COMPARISON:  None available FINDINGS: Mild cardiomegaly with vascular and interstitial prominence. Difficult to exclude chronic interstitial lung disease versus developing mild edema. Background COPD also suspected. No large effusion or pneumothorax. No focal collapse or consolidation. Trachea midline. Aorta atherosclerotic and degenerative changes noted of the spine. IMPRESSION: Cardiomegaly with vascular and interstitial prominence, nonspecific for edema versus interstitial lung disease Background COPD/emphysema No focal collapse or consolidation Electronically Signed   By: Jerilynn Mages.  Shick M.D.   On: 12/27/2018 09:05     Assessment and Recommendation  68 y.o. male with diabetes hypertension hyperlipidemia and acute respiratory failure with elevated troponin consistent with non-ST elevation myocardial infarction from coronary artery disease LV systolic  dysfunction and demand ischemia rather than acute coronary syndrome with moderate global LV systolic dysfunction multifactorial in nature continuing to need further medical management 1.  Continue respiratory failure treatment with appropriate antibiotics inhalers pulmonary toilet and oxygen supplementation now working much better 2.    cardiac catheterization after recovery from respiratory failure hypoxia due to LV systolic dysfunction and elevated troponin for risk stratification and further treatment options.  Patient understands risk and benefits of cardiac catheterization this includes the possibility of death stroke heart attack infection bleeding or blood clot.  He is lower risk for conscious sedation  3.  Continue medication management for risk factors with hypertension control lipid management and diabetes treatment as per above 4.  Would consider heparin for non-ST elevation myocardial infarction and demand ischemia continuing at this time 5.  Continuation of dose of furosemide for possible mild pulmonary edema by chest x-ray and continued hypoxia Signed, Serafina Royals M.D. FACC

## 2018-12-30 NOTE — Progress Notes (Signed)
Condom cath removed, Pt encouraged to use urinal. Pt is alert and oriented and stand by assist at this time. I will continue to assess.

## 2018-12-31 ENCOUNTER — Encounter
Admission: EM | Disposition: A | Discharge status: Home / Self Care | Payer: Self-pay | Source: Home / Self Care | Attending: Internal Medicine

## 2018-12-31 HISTORY — PX: LEFT HEART CATH AND CORONARY ANGIOGRAPHY: CATH118249

## 2018-12-31 HISTORY — PX: CORONARY STENT INTERVENTION: CATH118234

## 2018-12-31 LAB — GLUCOSE, CAPILLARY
Glucose-Capillary: 137 mg/dL — ABNORMAL HIGH (ref 70–99)
Glucose-Capillary: 162 mg/dL — ABNORMAL HIGH (ref 70–99)
Glucose-Capillary: 237 mg/dL — ABNORMAL HIGH (ref 70–99)

## 2018-12-31 LAB — POCT ACTIVATED CLOTTING TIME
Activated Clotting Time: 142 seconds
Activated Clotting Time: 191 seconds
Activated Clotting Time: 224 seconds

## 2018-12-31 LAB — BASIC METABOLIC PANEL
Anion gap: 9 (ref 5–15)
BUN: 29 mg/dL — ABNORMAL HIGH (ref 8–23)
CO2: 28 mmol/L (ref 22–32)
Calcium: 8.8 mg/dL — ABNORMAL LOW (ref 8.9–10.3)
Chloride: 98 mmol/L (ref 98–111)
Creatinine, Ser: 0.87 mg/dL (ref 0.61–1.24)
GFR calc Af Amer: >60 mL/min (ref >60)
GFR calc non Af Amer: >60 mL/min (ref >60)
Glucose, Bld: 147 mg/dL — ABNORMAL HIGH (ref 70–99)
Potassium: 4.3 mmol/L (ref 3.5–5.1)
Sodium: 135 mmol/L (ref 135–145)

## 2018-12-31 LAB — MAGNESIUM: Magnesium: 2.1 mg/dL (ref 1.7–2.4)

## 2018-12-31 SURGERY — LEFT HEART CATH AND CORONARY ANGIOGRAPHY
Anesthesia: Moderate Sedation

## 2018-12-31 MED ORDER — ASPIRIN 81 MG PO CHEW: 81.0000 mg | CHEWABLE_TABLET | ORAL | Status: DC

## 2018-12-31 MED ORDER — SODIUM CHLORIDE 0.9 % WEIGHT BASED INFUSION: 1.0000 mL/kg/h | INTRAVENOUS | Status: DC

## 2018-12-31 MED ORDER — HEPARIN SODIUM (PORCINE) 1000 UNIT/ML IJ SOLN
INTRAMUSCULAR | Status: AC
  Filled 2018-12-31: qty 1

## 2018-12-31 MED ORDER — HEPARIN (PORCINE) IN NACL 1000-0.9 UT/500ML-% IV SOLN
INTRAVENOUS | Status: AC
  Filled 2018-12-31: qty 1000

## 2018-12-31 MED ORDER — FENTANYL CITRATE (PF) 100 MCG/2ML IJ SOLN
INTRAMUSCULAR | Status: AC
  Filled 2018-12-31: qty 2

## 2018-12-31 MED ORDER — METHYLPREDNISOLONE SODIUM SUCC 40 MG IJ SOLR
20.0000 mg | Freq: Two times a day (BID) | INTRAMUSCULAR | Status: DC
  Administered 2018-12-31: 23:00:00 20 mg via INTRAVENOUS
  Filled 2018-12-31: qty 1

## 2018-12-31 MED ORDER — TICAGRELOR 90 MG PO TABS
90.0000 mg | ORAL_TABLET | Freq: Two times a day (BID) | ORAL | Status: DC
  Administered 2018-12-31 – 2019-01-01 (×2): 90 mg via ORAL
  Filled 2018-12-31 (×2): qty 1

## 2018-12-31 MED ORDER — TICAGRELOR 90 MG PO TABS
ORAL_TABLET | ORAL | Status: AC
  Filled 2018-12-31: qty 2

## 2018-12-31 MED ORDER — ASPIRIN 81 MG PO CHEW
CHEWABLE_TABLET | ORAL | Status: DC | PRN
  Administered 2018-12-31: 243 mg via ORAL

## 2018-12-31 MED ORDER — SODIUM CHLORIDE 0.9 % IV SOLN: 250.0000 mL | INTRAVENOUS | Status: DC | PRN

## 2018-12-31 MED ORDER — ONDANSETRON HCL 4 MG/2ML IJ SOLN: 4.0000 mg | Freq: Four times a day (QID) | INTRAMUSCULAR | Status: DC | PRN

## 2018-12-31 MED ORDER — SODIUM CHLORIDE 0.9 % WEIGHT BASED INFUSION
1.0000 mL/kg/h | INTRAVENOUS | Status: AC
  Administered 2018-12-31: 10:00:00 1 mL/kg/h via INTRAVENOUS

## 2018-12-31 MED ORDER — CLOPIDOGREL BISULFATE 75 MG PO TABS
ORAL_TABLET | ORAL | Status: AC
  Filled 2018-12-31: qty 8

## 2018-12-31 MED ORDER — SODIUM CHLORIDE 0.9% FLUSH: 3.0000 mL | Freq: Two times a day (BID) | INTRAVENOUS | Status: DC

## 2018-12-31 MED ORDER — SODIUM CHLORIDE 0.9 % WEIGHT BASED INFUSION
1.0000 mL/kg/h | INTRAVENOUS | Status: DC
  Administered 2018-12-31: 08:00:00 1 mL/kg/h via INTRAVENOUS

## 2018-12-31 MED ORDER — HEPARIN SODIUM (PORCINE) 1000 UNIT/ML IJ SOLN
INTRAMUSCULAR | Status: DC | PRN
  Administered 2018-12-31 (×2): 4000 [IU] via INTRAVENOUS

## 2018-12-31 MED ORDER — LABETALOL HCL 5 MG/ML IV SOLN
INTRAVENOUS | Status: AC
  Administered 2018-12-31: 40 mg via INTRAVENOUS
  Filled 2018-12-31: qty 8

## 2018-12-31 MED ORDER — LABETALOL HCL 5 MG/ML IV SOLN
40.0000 mg | Freq: Once | INTRAVENOUS | Status: AC
  Administered 2018-12-31: 11:00:00 40 mg via INTRAVENOUS

## 2018-12-31 MED ORDER — BIVALIRUDIN TRIFLUOROACETATE 250 MG IV SOLR
INTRAVENOUS | Status: AC
  Filled 2018-12-31: qty 250

## 2018-12-31 MED ORDER — SODIUM CHLORIDE 0.9% FLUSH: 3.0000 mL | INTRAVENOUS | Status: DC | PRN

## 2018-12-31 MED ORDER — MIDAZOLAM HCL 2 MG/2ML IJ SOLN
INTRAMUSCULAR | Status: DC | PRN
  Administered 2018-12-31 (×2): 1 mg via INTRAVENOUS

## 2018-12-31 MED ORDER — ENOXAPARIN SODIUM 40 MG/0.4ML ~~LOC~~ SOLN
40.0000 mg | SUBCUTANEOUS | Status: DC
  Administered 2018-12-31: 40 mg via SUBCUTANEOUS
  Filled 2018-12-31: qty 0.4

## 2018-12-31 MED ORDER — ASPIRIN 81 MG PO CHEW
CHEWABLE_TABLET | ORAL | Status: AC
  Administered 2018-12-31: 81 mg via ORAL
  Filled 2018-12-31: qty 1

## 2018-12-31 MED ORDER — HEPARIN SODIUM (PORCINE) 1000 UNIT/ML IJ SOLN
INTRAMUSCULAR | Status: DC | PRN
  Administered 2018-12-31: 3500 [IU] via INTRAVENOUS

## 2018-12-31 MED ORDER — IOHEXOL 300 MG/ML  SOLN
INTRAMUSCULAR | Status: DC | PRN
  Administered 2018-12-31: 09:00:00 110 mL via INTRAVENOUS

## 2018-12-31 MED ORDER — LABETALOL HCL 5 MG/ML IV SOLN: 10.0000 mg | INTRAVENOUS | Status: AC | PRN

## 2018-12-31 MED ORDER — VERAPAMIL HCL 2.5 MG/ML IV SOLN
INTRAVENOUS | Status: DC | PRN
  Administered 2018-12-31: 2.5 mg via INTRA_ARTERIAL

## 2018-12-31 MED ORDER — ASPIRIN 81 MG PO CHEW
CHEWABLE_TABLET | ORAL | Status: AC
  Filled 2018-12-31: qty 3

## 2018-12-31 MED ORDER — ALPRAZOLAM 0.5 MG PO TABS
ORAL_TABLET | ORAL | Status: AC
  Filled 2018-12-31: qty 1

## 2018-12-31 MED ORDER — SODIUM CHLORIDE 0.9 % WEIGHT BASED INFUSION: 3.0000 mL/kg/h | INTRAVENOUS | Status: DC

## 2018-12-31 MED ORDER — FENTANYL CITRATE (PF) 100 MCG/2ML IJ SOLN
INTRAMUSCULAR | Status: DC | PRN
  Administered 2018-12-31 (×2): 25 ug via INTRAVENOUS
  Administered 2018-12-31: 50 ug via INTRAVENOUS

## 2018-12-31 MED ORDER — HYDRALAZINE HCL 20 MG/ML IJ SOLN: 10.0000 mg | INTRAMUSCULAR | Status: AC | PRN

## 2018-12-31 MED ORDER — MIDAZOLAM HCL 2 MG/2ML IJ SOLN
INTRAMUSCULAR | Status: DC | PRN
  Administered 2018-12-31: 1 mg via INTRAVENOUS

## 2018-12-31 MED ORDER — IOHEXOL 300 MG/ML  SOLN
INTRAMUSCULAR | Status: DC | PRN
  Administered 2018-12-31: 10:00:00 185 mL via INTRA_ARTERIAL

## 2018-12-31 MED ORDER — VERAPAMIL HCL 2.5 MG/ML IV SOLN
INTRAVENOUS | Status: AC
  Filled 2018-12-31: qty 2

## 2018-12-31 MED ORDER — ASPIRIN 81 MG PO CHEW
81.0000 mg | CHEWABLE_TABLET | ORAL | Status: AC
  Administered 2018-12-31: 07:00:00 81 mg via ORAL

## 2018-12-31 MED ORDER — HEPARIN (PORCINE) IN NACL 2000-0.9 UNIT/L-% IV SOLN
INTRAVENOUS | Status: DC | PRN
  Administered 2018-12-31: 500 mL

## 2018-12-31 MED ORDER — SODIUM CHLORIDE 0.9 % WEIGHT BASED INFUSION
3.0000 mL/kg/h | INTRAVENOUS | Status: AC
  Administered 2018-12-31: 07:00:00 3 mL/kg/h via INTRAVENOUS

## 2018-12-31 MED ORDER — ACETAMINOPHEN 325 MG PO TABS: 650.0000 mg | ORAL_TABLET | ORAL | Status: DC | PRN

## 2018-12-31 MED ORDER — FENTANYL CITRATE (PF) 100 MCG/2ML IJ SOLN
INTRAMUSCULAR | Status: DC | PRN
  Administered 2018-12-31: 50 ug via INTRAVENOUS

## 2018-12-31 MED ORDER — MIDAZOLAM HCL 2 MG/2ML IJ SOLN
INTRAMUSCULAR | Status: AC
  Filled 2018-12-31: qty 2

## 2018-12-31 MED ORDER — TICAGRELOR 90 MG PO TABS
ORAL_TABLET | ORAL | Status: DC | PRN
  Administered 2018-12-31: 180 mg via ORAL

## 2018-12-31 SURGICAL SUPPLY — 16 items
BALLN EUPHORA RX 2.5X15 (BALLOONS) ×3
BALLOON EUPHORA RX 2.5X15 (BALLOONS) IMPLANT
CATH INFINITI 5 FR JL3.5 (CATHETERS) ×2 IMPLANT
CATH INFINITI 5FR ANG PIGTAIL (CATHETERS) ×2 IMPLANT
CATH INFINITI JR4 5F (CATHETERS) ×2 IMPLANT
CATH LAUNCHER 6FR JL3.5 (CATHETERS) ×2 IMPLANT
CATH VISTA GUIDE 6FR JL4 (CATHETERS) ×2 IMPLANT
DEVICE INFLAT 30 PLUS (MISCELLANEOUS) ×2 IMPLANT
DEVICE RAD TR BAND REGULAR (VASCULAR PRODUCTS) ×4 IMPLANT
GLIDESHEATH SLEND SS 6F .021 (SHEATH) ×2 IMPLANT
KIT MANI 3VAL PERCEP (MISCELLANEOUS) ×3 IMPLANT
PACK CARDIAC CATH (CUSTOM PROCEDURE TRAY) ×3 IMPLANT
STENT RESOLUTE ONYX 3.5X15 (Permanent Stent) ×2 IMPLANT
WIRE G HI TQ BMW 190 (WIRE) ×2 IMPLANT
WIRE HITORQ VERSACORE ST 145CM (WIRE) ×2 IMPLANT
WIRE ROSEN-J .035X260CM (WIRE) ×2 IMPLANT

## 2018-12-31 NOTE — Progress Notes (Signed)
Messaged Dr. Erin Fulling about patien'ts anxiety and desire to leave the hospital. Dr. Nehemiah Massed at bedside.

## 2018-12-31 NOTE — Progress Notes (Addendum)
Messaged MD concerning elevated DBP >100s post cath procedure. Monitoring BP  Patient complains of anxiety and desires to be discharged today. Educated patient about the need to stay tonight for monitoring post cath intervention/stent placement. Administered PRN xanax per order.

## 2018-12-31 NOTE — Progress Notes (Signed)
[facility] Encounter Note  Patient: Theodore Obrien / Admit Date: 12/27/2018 / Date of Encounter: 12/31/2018, 8:29 AM   Subjective: Patient continues to have respiratory failure with hypoxia requiring supplemental oxygen.  No current evidence of chest discomfort or anginal symptoms.  Patient does have an elevated troponin change more consistent with non-ST elevation myocardial infarction possibly from coronary artery disease and or from hypoxia rather than acute coronary syndrome.  EKG is abnormal now changing with normal sinus rhythm with left anterior fascicular block and diffuse T wave inversion.  Echocardiogram showing global LV systolic dysfunction with ejection fraction of 30% BNP has peaked above 2000 and troponin at 485 Chest x-ray suggests diffuse pulmonary infiltrate although some edema as well which may be improved with Lasix Cardiac catheterization showing apical hypokinesis with ejection fraction of 35 to 40%  Significant 90% proximal LAD stenosis likely causing significant symptoms and non-ST elevation myocardial infarction  review of Systems: Positive for: Shortness of breath Negative for: Vision change, hearing change, syncope, dizziness, nausea, vomiting,diarrhea, bloody stool, stomach pain, cough, congestion, diaphoresis, urinary frequency, urinary pain,skin lesions, skin rashes Others previously listed  Objective: Telemetry: Sinus tachycardia Physical Exam: Blood pressure 126/86, pulse 86, temperature 98.8 F (37.1 C), temperature source Oral, resp. rate 12, height 5\' 10"  (1.778 m), weight 68.9 kg, SpO2 95 %. Body mass index is 21.79 kg/m. General: Well developed, well nourished, in no acute distress. Head: Normocephalic, atraumatic, sclera non-icteric, no xanthomas, nares are without discharge. Neck: No apparent masses Lungs: Normal respirations with diffuse  wheezes, some rhonchi, no rales , some crackles   Heart: Regular rate and rhythm, normal S1  S2, no murmur, no rub, no gallop, PMI is normal size and placement, carotid upstroke normal without bruit, jugular venous pressure normal Abdomen: Soft, non-tender, non-distended with normoactive bowel sounds. No hepatosplenomegaly. Abdominal aorta is normal size without bruit Extremities: No edema, no clubbing, no cyanosis, no ulcers,  Peripheral: 2+ radial, 2+ femoral, 2+ dorsal pedal pulses Neuro: Alert and oriented. Moves all extremities spontaneously. Psych:  Responds to questions appropriately with a normal affect.   Intake/Output Summary (Last 24 hours) at 12/31/2018 0829 Last data filed at 12/30/2018 1900 Gross per 24 hour  Intake 480 ml  Output 400 ml  Net 80 ml    Inpatient Medications:  . [MAR Hold] azithromycin  500 mg Oral Daily  . [MAR Hold] budesonide (PULMICORT) nebulizer solution  0.5 mg Nebulization BID  . [MAR Hold] Chlorhexidine Gluconate Cloth  6 each Topical Daily  . [MAR Hold] cloNIDine  0.2 mg Oral TID  . [MAR Hold] diltiazem  10 mg Intravenous Once  . [MAR Hold] enoxaparin (LOVENOX) injection  40 mg Subcutaneous Q24H  . [MAR Hold] folic acid  1 mg Oral Daily  . [MAR Hold] furosemide  40 mg Intravenous Daily  . [MAR Hold] insulin aspart  0-15 Units Subcutaneous TID WC  . [MAR Hold] insulin aspart  0-5 Units Subcutaneous QHS  . [MAR Hold] ipratropium-albuterol  3 mL Nebulization BID  . [MAR Hold] mouth rinse  15 mL Mouth Rinse BID  . [MAR Hold] methylPREDNISolone (SOLU-MEDROL) injection  40 mg Intravenous Q12H  . [MAR Hold] multivitamin with minerals  1 tablet Oral Daily  . [MAR Hold] nicotine  14 mg Transdermal Daily  . [MAR Hold] pantoprazole  40 mg Oral Daily  . [MAR Hold] sodium chloride flush  3 mL Intravenous Q12H  . [MAR Hold] thiamine  100 mg Oral Daily   Infusions:  .  sodium chloride    . sodium chloride 1 mL/kg/hr (12/31/18 0746)  . [MAR Hold] cefTRIAXone (ROCEPHIN)  IV 1 g (12/31/18 0451)    Labs: Recent Labs    12/28/18 1934 12/29/18 0650  12/31/18 0444  NA  --  136 135  K 4.3 4.1 4.3  CL  --  102 98  CO2  --  24 28  GLUCOSE  --  157* 147*  BUN  --  28* 29*  CREATININE  --  0.83 0.87  CALCIUM  --  8.8* 8.8*  MG 2.1  --  2.1  PHOS 2.8  --   --    No results for input(s): AST, ALT, ALKPHOS, BILITOT, PROT, ALBUMIN in the last 72 hours. No results for input(s): WBC, NEUTROABS, HGB, HCT, MCV, PLT in the last 72 hours. No results for input(s): CKTOTAL, CKMB, TROPONINI in the last 72 hours. Invalid input(s): POCBNP No results for input(s): HGBA1C in the last 72 hours.   Weights: Filed Weights   12/30/18 [id number] 12/31/18 0410 12/31/18 0720  Weight: 71.4 kg 68.9 kg 68.9 kg     Radiology/Studies:  Dg Chest Port 1 View  Result Date: 12/28/2018 CLINICAL DATA:  Acute respiratory distress EXAM: PORTABLE CHEST 1 VIEW COMPARISON:  12/27/2018 FINDINGS: No significant change in fairly extensive interstitial lung opacities. No significant effusion. Stable cardiomediastinal silhouette. No pneumothorax. IMPRESSION: Similar appearance of fairly extensive bilateral interstitial opacities, some of which may be secondary to underlying chronic interstitial disease. Suspect that there is acute superimposed edema or interstitial inflammatory process/possible atypical pneumonia. Electronically Signed   By: Donavan Foil M.D.   On: 12/28/2018 00:52   Dg Chest Portable 1 View  Result Date: 12/27/2018 CLINICAL DATA:  Worsening shortness of breath, diminished breath sounds, COPD EXAM: PORTABLE CHEST 1 VIEW COMPARISON:  None available FINDINGS: Mild cardiomegaly with vascular and interstitial prominence. Difficult to exclude chronic interstitial lung disease versus developing mild edema. Background COPD also suspected. No large effusion or pneumothorax. No focal collapse or consolidation. Trachea midline. Aorta atherosclerotic and degenerative changes noted of the spine. IMPRESSION: Cardiomegaly with vascular and interstitial prominence, nonspecific for  edema versus interstitial lung disease Background COPD/emphysema No focal collapse or consolidation Electronically Signed   By: Jerilynn Mages.  Shick M.D.   On: 12/27/2018 09:05     Assessment and Recommendation  68 y.o. male with diabetes hypertension hyperlipidemia and acute respiratory failure with elevated troponin consistent with non-ST elevation myocardial infarction from coronary artery disease LV systolic dysfunction and demand ischemia rather than acute coronary syndrome with moderate apical LV systolic dysfunction multifactorial in nature continuing to need further medical management 1.  Continue respiratory failure treatment with appropriate antibiotics inhalers pulmonary toilet and oxygen supplementation now working much better 2.    Proceed to PCI and stent placement of proximal left anterior descending artery stenosis likely contributing to current issues of shortness of breath and non-ST elevation myocardial infarction 3.  Continue medication management for risk factors with hypertension control lipid management and diabetes treatment as per above 4.  Dual antiplatelet therapy 5.  Continuation of dose of furosemide for possible mild pulmonary edema by chest x-ray and continued hypoxia Signed, Serafina Royals M.D. FACC

## 2018-12-31 NOTE — Progress Notes (Addendum)
[facility] [place] NAME: Theodore Obrien    MR#:  [id number]  DATE OF BIRTH:  1950-07-29  SUBJECTIVE:  CHIEF COMPLAINT:  No new complaint this morning.  Patient had cardiac catheterization with stent placement today.  No fevers.  No chest pain.  Patient was initially asking about discharge home.  Plans for discharge home tomorrow after evaluation by physical therapist.  REVIEW OF SYSTEMS:  Review of Systems  Constitutional: Negative for chills and fever.  HENT: Negative for hearing loss and tinnitus.   Eyes: Negative for blurred vision and double vision.  Respiratory: Negative for cough and shortness of breath.   Cardiovascular: Negative for chest pain and palpitations.  Gastrointestinal: Negative for heartburn and nausea.  Genitourinary: Negative for dysuria and urgency.  Musculoskeletal: Negative for myalgias and neck pain.  Skin: Negative for itching and rash.  Neurological: Negative for dizziness and headaches.  Psychiatric/Behavioral: Negative for depression and hallucinations.   DRUG ALLERGIES:   Allergies  Allergen Reactions  . Zyvox [Linezolid]     Nerve damage     VITALS:  Blood pressure 113/88, pulse 66, temperature 98.7 F (37.1 C), resp. rate 18, height 5\' 10"  (1.778 m), weight 68.9 kg, SpO2 96 %.  PHYSICAL EXAMINATION:  GENERAL:  68 y.o.-year-old patient lying in the bed with no acute distress.  EYES: Pupils equal, round, reactive to light and accommodation. No scleral icterus. Extraocular muscles intact.  HEENT: Head atraumatic, normocephalic. Oropharynx and nasopharynx clear.  NECK:  Supple, no jugular venous distention. No thyroid enlargement, no tenderness.  LUNGS: Normal breath sounds bilaterally, no wheezing, rales,rhonchi or crepitation. No use of accessory muscles of respiration.  CARDIOVASCULAR: S1, S2 normal. No murmurs, rubs, or gallops.  ABDOMEN: Soft, nontender, nondistended. Bowel sounds present.   EXTREMITIES: No pedal edema, cyanosis, or clubbing.  NEUROLOGIC: Awake and alert and oriented.  Generalized weakness.  PSYCHIATRIC: The patient awake and alert and oriented   SKIN: No obvious rash, lesion, or ulcer.    LABORATORY PANEL:   CBC Recent Labs  Lab 12/28/18 0659  WBC 10.4  HGB 15.7  HCT 47.5  PLT 153   ------------------------------------------------------------------------------------------------------------------  Chemistries  Recent Labs  Lab 12/28/18 0659  12/31/18 0444  NA 140   < > 135  K 4.0   < > 4.3  CL 105   < > 98  CO2 25   < > 28  GLUCOSE 213*   < > 147*  BUN 24*   < > 29*  CREATININE 0.92   < > 0.87  CALCIUM 8.9   < > 8.8*  MG  --    < > 2.1  AST 33  --   --   ALT 44  --   --   ALKPHOS 72  --   --   BILITOT 1.7*  --   --    < > = values in this interval not displayed.   ------------------------------------------------------------------------------------------------------------------  Cardiac Enzymes No results for input(s): TROPONINI in the last 168 hours. ------------------------------------------------------------------------------------------------------------------  RADIOLOGY:  No results found.  EKG:   Orders placed or performed during the hospital encounter of 12/27/18  . EKG 12-Lead  . EKG 12-Lead  . ED EKG  . ED EKG  . EKG 12-Lead  . EKG 12-Lead  . EKG 12-Lead  . EKG 12-Lead  . EKG 12-Lead immediately post procedure  . EKG 12-Lead  . EKG 12-Lead  . EKG 12-Lead  . EKG 12-Lead immediately  post procedure    ASSESSMENT AND PLAN:   # Acute hypoxic respiratory failure 2/2 COPD exacerbation Patient already weaned off BiPAP. Continue IV steroids with Solu-Medrol.  Initiated further tapering of IV Solu-Medrol today.  Plans to transition to p.o. prednisone taper on discharge DuoNeb.  Empiric antibiotics with Rocephin.   #Acute bronchitis IV Rocephin and bronchodilator treatment  #Non-ST MI elevation  Patient being  followed by cardiologist Dr. Nehemiah Massed. Elevated troponin consistent with non-ST MI elevation.   Patient seen by cardiologist and had cardiac catheterization done today with stent placement of proximal LAD.  Recommendation for dual antiplatelet therapy.  High intensity statins.  Smoking cessation counseling.  Blood pressure control and cardiac rehab post discharge from the hospital.  Acute systolic CHF exacerbation ; most likely present on admission.  Patient presented with evidence of pulmonary edema  2D echocardiogram done with ejection fraction of 30 to 35%.  Left ventricular hypokinesis with severe hypokinesis of the anterior, apical, lateral regions and mid to distal inferior walls. Patient currently on IV Lasix.  #Essential hypertension Blood pressure better controlled  #Anxiety Stable this time.  #Tobacco abuse disorder Counseled patient to quit smoking at the time of admission    #History of alcohol abuse with alcohol withdrawal.  Resolved Patient on alcohol withdrawal protocol.  Previously weaned off Precedex and transferred out of ICU. Continue thiamine, multivitamin and folic acid.  DVT prophylaxis with Lovenox  All the records are reviewed and case discussed with Care Management/Social Workerr. Management plans discussed with the patient, family and they are in agreement. Called patient sister listed in the chart Ms. Langley Gauss; I updated her on treatment plans as outlined above and all questions were answered.  She is in agreement to the plan of care.  CODE STATUS:   TOTAL TIME TAKING CARE OF THIS PATIENT: 35  minutes.   POSSIBLE D/C IN 2DAYS, DEPENDING ON CLINICAL CONDITION.  Note: This dictation was prepared with Dragon dictation along with smaller phrase technology. Any transcriptional errors that result from this process are unintentional.   [name] M.D on 12/31/2018 at 3:11 PM  Between 7am to 6pm - Pager - [phone] After 6pm go to [email or web address] - password  [facility]  Office  [phone]  CC: Primary care physician; Tracie Harrier, MD

## 2018-12-31 NOTE — Progress Notes (Signed)
Patient in cath lab, received report from the nurse, patient will be coming to the floor soon .

## 2018-12-31 NOTE — Care Management Important Message (Signed)
**Note Theodore-Identified via Obfuscation** Important Message  Patient Details  Name: Theodore Obrien MRN: [id number] Date of Birth: 03-Apr-1951   Medicare Important Message Given:  Yes     Dannette Barbara 12/31/2018, 11:49 AM

## 2019-01-01 ENCOUNTER — Encounter: Payer: Self-pay | Admitting: Internal Medicine

## 2019-01-01 LAB — CULTURE, BLOOD (ROUTINE X 2)
Culture: NO GROWTH
Culture: NO GROWTH

## 2019-01-01 LAB — BASIC METABOLIC PANEL WITH GFR
Anion gap: 12 (ref 5–15)
BUN: 52 mg/dL — ABNORMAL HIGH (ref 8–23)
CO2: 26 mmol/L (ref 22–32)
Calcium: 8.1 mg/dL — ABNORMAL LOW (ref 8.9–10.3)
Chloride: 94 mmol/L — ABNORMAL LOW (ref 98–111)
Creatinine, Ser: 1.7 mg/dL — ABNORMAL HIGH (ref 0.61–1.24)
GFR calc Af Amer: 48 mL/min — ABNORMAL LOW
GFR calc non Af Amer: 41 mL/min — ABNORMAL LOW
Glucose, Bld: 169 mg/dL — ABNORMAL HIGH (ref 70–99)
Potassium: 4.2 mmol/L (ref 3.5–5.1)
Sodium: 132 mmol/L — ABNORMAL LOW (ref 135–145)

## 2019-01-01 LAB — BLOOD GAS, VENOUS
Patient temperature: 37
pCO2, Ven: 73 mmHg (ref 44.0–60.0)
pH, Ven: 7.25 (ref 7.250–7.430)

## 2019-01-01 LAB — CBC
HCT: 46.6 % (ref 39.0–52.0)
Hemoglobin: 15.1 g/dL (ref 13.0–17.0)
MCH: 28.5 pg (ref 26.0–34.0)
MCHC: 32.4 g/dL (ref 30.0–36.0)
MCV: 88.1 fL (ref 80.0–100.0)
Platelets: 201 K/uL (ref 150–400)
RBC: 5.29 MIL/uL (ref 4.22–5.81)
RDW: 12.6 % (ref 11.5–15.5)
WBC: 13.4 K/uL — ABNORMAL HIGH (ref 4.0–10.5)
nRBC: 0 % (ref 0.0–0.2)

## 2019-01-01 LAB — MAGNESIUM: Magnesium: 2.2 mg/dL (ref 1.7–2.4)

## 2019-01-01 LAB — GLUCOSE, CAPILLARY: Glucose-Capillary: 112 mg/dL — ABNORMAL HIGH (ref 70–99)

## 2019-01-01 MED ORDER — ATORVASTATIN CALCIUM 40 MG PO TABS: 40.0000 mg | ORAL_TABLET | Freq: Every day | ORAL | 0 refills | Status: DC

## 2019-01-01 MED ORDER — TICAGRELOR 90 MG PO TABS: 90.0000 mg | ORAL_TABLET | Freq: Two times a day (BID) | ORAL | 0 refills | Status: DC

## 2019-01-01 MED ORDER — CARVEDILOL 3.125 MG PO TABS: 3.1250 mg | ORAL_TABLET | Freq: Two times a day (BID) | ORAL | 0 refills | Status: AC

## 2019-01-01 MED ORDER — ASPIRIN EC 81 MG PO TBEC
81.0000 mg | DELAYED_RELEASE_TABLET | Freq: Every day | ORAL | Status: DC
  Administered 2019-01-01: 81 mg via ORAL
  Filled 2019-01-01: qty 1

## 2019-01-01 MED ORDER — PREDNISONE 10 MG PO TABS: ORAL_TABLET | ORAL | 0 refills | Status: DC

## 2019-01-01 MED ORDER — NICOTINE 14 MG/24HR TD PT24: 14.0000 mg | MEDICATED_PATCH | Freq: Every day | TRANSDERMAL | 0 refills | Status: DC

## 2019-01-01 MED ORDER — ASPIRIN 81 MG PO TBEC: 81.0000 mg | DELAYED_RELEASE_TABLET | Freq: Every day | ORAL | 0 refills | Status: AC

## 2019-01-01 NOTE — Progress Notes (Signed)
[facility] Encounter Note  Patient: Theodore Obrien / Admit Date: 12/27/2018 / Date of Encounter: 01/01/2019, 8:53 AM   Subjective: Patient continues to have respiratory failure with hypoxia requiring supplemental oxygen.  No current evidence of chest discomfort or anginal symptoms.  Patient does have an elevated troponin change more consistent with non-ST elevation myocardial infarction possibly from coronary artery disease and or from hypoxia rather than acute coronary syndrome.  EKG is abnormal now changing with normal sinus rhythm with left anterior fascicular block and diffuse T wave inversion.   Echocardiogram showing global LV systolic dysfunction with ejection fraction of 30% BNP has peaked above 2000 and troponin at 485 Chest x-ray suggests diffuse pulmonary infiltrate although some edema as well which may be improved with Lasix Cardiac catheterization showing apical hypokinesis with ejection fraction of 35 to 40%  Significant 90% proximal LAD stenosis likely causing significant symptoms and non-ST elevation myocardial infarction  Patient is now feeling much better after PCI and stent placement of left anterior descending artery without further significant symptoms review of Systems: Positive for: None Negative for: Vision change, hearing change, syncope, dizziness, nausea, vomiting,diarrhea, bloody stool, stomach pain, cough, congestion, diaphoresis, urinary frequency, urinary pain,skin lesions, skin rashes Others previously listed  Objective: Telemetry: Normal sinus rhythm Physical Exam: Blood pressure 102/70, pulse (!) 52, temperature (!) 97.5 F (36.4 C), temperature source Oral, resp. rate 18, height 5\' 10"  (1.778 m), weight 70.1 kg, SpO2 100 %. Body mass index is 22.17 kg/m. General: Well developed, well nourished, in no acute distress. Head: Normocephalic, atraumatic, sclera non-icteric, no xanthomas, nares are without discharge. Neck: No apparent  masses Lungs: Normal respirations with diffuse  wheezes, some rhonchi, no rales , some crackles   Heart: Regular rate and rhythm, normal S1 S2, no murmur, no rub, no gallop, PMI is normal size and placement, carotid upstroke normal without bruit, jugular venous pressure normal Abdomen: Soft, non-tender, non-distended with normoactive bowel sounds. No hepatosplenomegaly. Abdominal aorta is normal size without bruit Extremities: No edema, no clubbing, no cyanosis, no ulcers,  Peripheral: 2+ radial, 2+ femoral, 2+ dorsal pedal pulses Neuro: Alert and oriented. Moves all extremities spontaneously. Psych:  Responds to questions appropriately with a normal affect.   Intake/Output Summary (Last 24 hours) at 01/01/2019 0853 Last data filed at 01/01/2019 0022 Gross per 24 hour  Intake -  Output 1150 ml  Net -1150 ml    Inpatient Medications:  . aspirin EC  81 mg Oral Daily  . azithromycin  500 mg Oral Daily  . budesonide (PULMICORT) nebulizer solution  0.5 mg Nebulization BID  . Chlorhexidine Gluconate Cloth  6 each Topical Daily  . cloNIDine  0.2 mg Oral TID  . diltiazem  10 mg Intravenous Once  . enoxaparin (LOVENOX) injection  40 mg Subcutaneous Q24H  . folic acid  1 mg Oral Daily  . insulin aspart  0-15 Units Subcutaneous TID WC  . insulin aspart  0-5 Units Subcutaneous QHS  . ipratropium-albuterol  3 mL Nebulization BID  . mouth rinse  15 mL Mouth Rinse BID  . methylPREDNISolone (SOLU-MEDROL) injection  20 mg Intravenous Q12H  . multivitamin with minerals  1 tablet Oral Daily  . nicotine  14 mg Transdermal Daily  . pantoprazole  40 mg Oral Daily  . sodium chloride flush  3 mL Intravenous Q12H  . sodium chloride flush  3 mL Intravenous Q12H  . thiamine  100 mg Oral Daily  . ticagrelor  90 mg Oral BID  Infusions:  . sodium chloride    . cefTRIAXone (ROCEPHIN)  IV 1 g (01/01/19 0535)    Labs: Recent Labs    12/31/18 0444 01/01/19 0455  NA 135 132*  K 4.3 4.2  CL 98 94*  CO2  28 26  GLUCOSE 147* 169*  BUN 29* 52*  CREATININE 0.87 1.70*  CALCIUM 8.8* 8.1*  MG 2.1 2.2   No results for input(s): AST, ALT, ALKPHOS, BILITOT, PROT, ALBUMIN in the last 72 hours. Recent Labs    01/01/19 0455  WBC 13.4*  HGB 15.1  HCT 46.6  MCV 88.1  PLT 201   No results for input(s): CKTOTAL, CKMB, TROPONINI in the last 72 hours. Invalid input(s): POCBNP No results for input(s): HGBA1C in the last 72 hours.   Weights: Filed Weights   12/31/18 0410 12/31/18 0720 12/31/18 2045  Weight: 68.9 kg 68.9 kg 70.1 kg     Radiology/Studies:  Dg Chest Port 1 View  Result Date: 12/28/2018 CLINICAL DATA:  Acute respiratory distress EXAM: PORTABLE CHEST 1 VIEW COMPARISON:  12/27/2018 FINDINGS: No significant change in fairly extensive interstitial lung opacities. No significant effusion. Stable cardiomediastinal silhouette. No pneumothorax. IMPRESSION: Similar appearance of fairly extensive bilateral interstitial opacities, some of which may be secondary to underlying chronic interstitial disease. Suspect that there is acute superimposed edema or interstitial inflammatory process/possible atypical pneumonia. Electronically Signed   By: Donavan Foil M.D.   On: 12/28/2018 00:52   Dg Chest Portable 1 View  Result Date: 12/27/2018 CLINICAL DATA:  Worsening shortness of breath, diminished breath sounds, COPD EXAM: PORTABLE CHEST 1 VIEW COMPARISON:  None available FINDINGS: Mild cardiomegaly with vascular and interstitial prominence. Difficult to exclude chronic interstitial lung disease versus developing mild edema. Background COPD also suspected. No large effusion or pneumothorax. No focal collapse or consolidation. Trachea midline. Aorta atherosclerotic and degenerative changes noted of the spine. IMPRESSION: Cardiomegaly with vascular and interstitial prominence, nonspecific for edema versus interstitial lung disease Background COPD/emphysema No focal collapse or consolidation Electronically  Signed   By: Jerilynn Mages.  Shick M.D.   On: 12/27/2018 09:05     Assessment and Recommendation  68 y.o. male with diabetes hypertension hyperlipidemia and acute respiratory failure with elevated troponin consistent with non-ST elevation myocardial infarction from coronary artery disease LV systolic dysfunction and demand ischemia rather than acute coronary syndrome with moderate apical LV systolic dysfunction multifactorial in nature continuing to need further medical management 1.  Continue respiratory failure treatment with appropriate antibiotics inhalers pulmonary toilet and oxygen supplementation now working much better and significantly improved at this time 2.    No further cardiac intervention at this time due to successful PCI and stent placement of left anterior descending artery now improved without evidence of complication 3.  Continue medication management for risk factors with hypertension control lipid management and diabetes treatment as per above 4.  Dual antiplatelet therapy 5.  Begin ambulation and follow for improvements of symptoms current medical regimen and okay for discharged home from cardiac standpoint with follow-up next week for further adjustments of medication management Signed, Serafina Royals M.D. FACC

## 2019-01-01 NOTE — Discharge Summary (Signed)
[facility] [facility] [place] NAME: Zani Konow    MR#:  [id number]  DATE OF BIRTH:  04/10/1951  DATE OF ADMISSION:  12/27/2018   ADMITTING PHYSICIAN: Nicholes Mango, MD  DATE OF DISCHARGE: 01/01/2019  PRIMARY CARE PHYSICIAN: Tracie Harrier, MD   ADMISSION DIAGNOSIS:  Respiratory distress [R06.03] DISCHARGE DIAGNOSIS:  Active Problems:   Acute respiratory failure with hypoxia ([place])  SECONDARY DIAGNOSIS:   Past Medical History:  Diagnosis Date  . GERD (gastroesophageal reflux disease)   . Hypertension    HOSPITAL COURSE:  Chief complaint; shortness of breath  History of presenting complaint; Azrael Diallo  is a 68 y.o. male with a known history of GERD and hypertension, 1 pack a day and history of drug abuse in the past is brought into the emergency department via EMS for worsening of the shortness of breath.  Patient called EMS as he could not breathe and feeling extremely tight in his chest.  Patient was given IV Solu-Medrol by the EMS and eventually patient is placed on BiPAP for worsening of breathing in the emergency department.  Chest x-ray with possible edema versus interstitial lung disease and troponin initially was 22.  Hospitalist team is called to admit the patient.  Covid test was negative   Hospital course; # Acute hypoxic respiratory failure 2/2 COPD exacerbation Patient already weaned off BiPAP.  Patient was treated with IV Solu-Medrol and subsequently transitioned to p.o. prednisone taper on discharge.  Completed adequate treatment with antibiotics with Rocephin during this admission.   #Acute bronchitis Adequately treated with antibiotics with Rocephin during this admission. IV Rocephin and bronchodilator treatment  #Non-ST MI elevation  Patient being followed by cardiologist Dr. Nehemiah Massed. Elevated troponin consistent with non-ST MI elevation.  Patient seen by cardiologist and had cardiac catheterization done with  stent placement of proximal LAD.  Recommendation for dual antiplatelet therapy with aspirin 81 mg p.o. daily plus Brilinta..  High intensity statins.  Smoking cessation counseling.  Blood pressure control and cardiac rehab post discharge from the hospital.  Appointment made to follow-up with cardiologist Dr. Nehemiah Massed post discharge from the hospital.  Acute systolic CHF exacerbation ; most likely present on admission.  Patient presented with evidence of pulmonary edema  2D echocardiogram done with ejection fraction of 30 to 35%.  Left ventricular hypokinesis with severe hypokinesis of the anterior, apical, lateral regions and mid to distal inferior walls.  Patient was treated with IV Lasix during this admission.  Adequately diuresed.  Lasix had to be placed on hold due to development of acute kidney injury.  To be reevaluated by primary care physician in a few days prior to resumption of Lasix as indicated.  Patient completely asymptomatic this morning.  Ambulated around the medical floor with no shortness of breath.  Continue Coreg with hold parameters.  Holding off on ACE inhibitor due to acute kidney injury until reevaluated by primary care physician and cardiologist  #Essential hypertension Blood pressure better controlled  #Anxiety Stable this time.  #Tobacco abuse disorder Counseled patient to quit smoking at the time of admission.  Nicotine patch.  #History of alcohol abuse with alcohol withdrawal.  Resolved Patient on alcohol withdrawal protocol.  Previously weaned off Precedex and transferred out of ICU.  Was treated with thiamine, multivitamin and folic acid during this admission  #Acute kidney injury Multifactorial secondary to recent diuresis with Lasix in addition with contrast exposure during recent cardiac catheterization.  Patient insisted on being discharged yesterday but  agreed to stay 1 more day till today.  I discussed with cardiologist Dr. Nehemiah Massed who stated he expect  improvement in renal function.  He will follow-up with patient's renal function post discharge from the hospital.  Appointment also made to follow-up with primary care physician to reevaluate renal function.  Avoid nephrotoxic agents.  I already called and updated patient's discharge yesterday evening on treatment and discharge plans for today.  DISCHARGE CONDITIONS:  Stable CONSULTS OBTAINED:  Treatment Team:  Corey Skains, MD DRUG ALLERGIES:   Allergies  Allergen Reactions  . Zyvox [Linezolid]     Nerve damage    DISCHARGE MEDICATIONS:   Allergies as of 01/01/2019      Reactions   Zyvox [linezolid]    Nerve damage       Medication List    TAKE these medications   ALPRAZolam 0.25 MG tablet Commonly known as: XANAX Take 0.25 mg by mouth 2 (two) times daily as needed for anxiety or sleep.   aspirin 81 MG EC tablet Take 1 tablet (81 mg total) by mouth daily. Start taking on: January 02, 2019   atorvastatin 40 MG tablet Commonly known as: Lipitor Take 1 tablet (40 mg total) by mouth daily.   carvedilol 3.125 MG tablet Commonly known as: COREG Take 1 tablet (3.125 mg total) by mouth 2 (two) times daily with a meal. Hold for heart rate less than 60. What changed: additional instructions   cloNIDine 0.1 MG tablet Commonly known as: CATAPRES Take 0.1 mg by mouth 3 (three) times daily.   nicotine 14 mg/24hr patch Commonly known as: NICODERM CQ - dosed in mg/24 hours Place 1 patch (14 mg total) onto the skin daily. Start taking on: January 02, 2019   predniSONE 10 MG tablet Commonly known as: DELTASONE 40 mg p.o. daily x2 days Then 30 mg p.o. daily x2 days Then 20 mg p.o. daily x2 days Then 10 mg p.o. daily x2 days   Proventil HFA 108 (90 Base) MCG/ACT inhaler Generic drug: albuterol Inhale into the lungs.   ticagrelor 90 MG Tabs tablet Commonly known as: BRILINTA Take 1 tablet (90 mg total) by mouth 2 (two) times daily.        DISCHARGE  INSTRUCTIONS:   DIET:  Cardiac diet DISCHARGE CONDITION:  Stable ACTIVITY:  Activity as tolerated OXYGEN:  Home Oxygen: No.  Oxygen Delivery: room air DISCHARGE LOCATION:  home   If you experience worsening of your admission symptoms, develop shortness of breath, life threatening emergency, suicidal or homicidal thoughts you must seek medical attention immediately by calling 911 or calling your MD immediately  if symptoms less severe.  You Must read complete instructions/literature along with all the possible adverse reactions/side effects for all the Medicines you take and that have been prescribed to you. Take any new Medicines after you have completely understood and accpet all the possible adverse reactions/side effects.   Please note  You were cared for by a hospitalist during your hospital stay. If you have any questions about your discharge medications or the care you received while you were in the hospital after you are discharged, you can call the unit and asked to speak with the hospitalist on call if the hospitalist that took care of you is not available. Once you are discharged, your primary care physician will handle any further medical issues. Please note that NO REFILLS for any discharge medications will be authorized once you are discharged, as it is imperative that you return to  your primary care physician (or establish a relationship with a primary care physician if you do not have one) for your aftercare needs so that they can reassess your need for medications and monitor your lab values.    On the day of Discharge:  VITAL SIGNS:  Blood pressure 102/70, pulse 79, temperature (!) 97.5 F (36.4 C), temperature source Oral, resp. rate 18, height 5\' 10"  (1.778 m), weight 70.1 kg, SpO2 100 %. PHYSICAL EXAMINATION:  GENERAL:  68 y.o.-year-old patient lying in the bed with no acute distress.  EYES: Pupils equal, round, reactive to light and accommodation. No scleral  icterus. Extraocular muscles intact.  HEENT: Head atraumatic, normocephalic. Oropharynx and nasopharynx clear.  NECK:  Supple, no jugular venous distention. No thyroid enlargement, no tenderness.  LUNGS: Normal breath sounds bilaterally, no wheezing, rales,rhonchi or crepitation. No use of accessory muscles of respiration.  CARDIOVASCULAR: S1, S2 normal. No murmurs, rubs, or gallops.  ABDOMEN: Soft, non-tender, non-distended. Bowel sounds present. No organomegaly or mass.  EXTREMITIES: No pedal edema, cyanosis, or clubbing.  NEUROLOGIC: Cranial nerves II through XII are intact. Muscle strength 5/5 in all extremities. Sensation intact. Gait not checked.  PSYCHIATRIC: The patient is alert and oriented x 3.  SKIN: No obvious rash, lesion, or ulcer.  DATA REVIEW:   CBC Recent Labs  Lab 01/01/19 0455  WBC 13.4*  HGB 15.1  HCT 46.6  PLT 201    Chemistries  Recent Labs  Lab 12/28/18 0659  01/01/19 0455  NA 140   < > 132*  K 4.0   < > 4.2  CL 105   < > 94*  CO2 25   < > 26  GLUCOSE 213*   < > 169*  BUN 24*   < > 52*  CREATININE 0.92   < > 1.70*  CALCIUM 8.9   < > 8.1*  MG  --    < > 2.2  AST 33  --   --   ALT 44  --   --   ALKPHOS 72  --   --   BILITOT 1.7*  --   --    < > = values in this interval not displayed.     Microbiology Results  Results for orders placed or performed during the hospital encounter of 12/27/18  SARS Coronavirus 2 [facility] order, Performed in [facility] hospital lab) Nasopharyngeal Nasopharyngeal Swab     Status: None   Collection Time: 12/27/18  8:36 AM   Specimen: Nasopharyngeal Swab  Result Value Ref Range Status   SARS Coronavirus 2 NEGATIVE NEGATIVE Final    Comment: (NOTE) If result is NEGATIVE SARS-CoV-2 target nucleic acids are NOT DETECTED. The SARS-CoV-2 RNA is generally detectable in upper and lower  respiratory specimens during the acute phase of infection. The lowest  concentration of SARS-CoV-2 viral copies this assay can detect  is 250  copies / mL. A negative result does not preclude SARS-CoV-2 infection  and should not be used as the sole basis for treatment or other  patient management decisions.  A negative result may occur with  improper specimen collection / handling, submission of specimen other  than nasopharyngeal swab, presence of viral mutation(s) within the  areas targeted by this assay, and inadequate number of viral copies  (<250 copies / mL). A negative result must be combined with clinical  observations, patient history, and epidemiological information. If result is POSITIVE SARS-CoV-2 target nucleic acids are DETECTED. The SARS-CoV-2 RNA is generally detectable in  upper and lower  respiratory specimens dur ing the acute phase of infection.  Positive  results are indicative of active infection with SARS-CoV-2.  Clinical  correlation with patient history and other diagnostic information is  necessary to determine patient infection status.  Positive results do  not rule out bacterial infection or co-infection with other viruses. If result is PRESUMPTIVE POSTIVE SARS-CoV-2 nucleic acids MAY BE PRESENT.   A presumptive positive result was obtained on the submitted specimen  and confirmed on repeat testing.  While 2019 novel coronavirus  (SARS-CoV-2) nucleic acids may be present in the submitted sample  additional confirmatory testing may be necessary for epidemiological  and / or clinical management purposes  to differentiate between  SARS-CoV-2 and other Sarbecovirus currently known to infect humans.  If clinically indicated additional testing with an alternate test  methodology [phone]) is advised. The SARS-CoV-2 RNA is generally  detectable in upper and lower respiratory sp ecimens during the acute  phase of infection. The expected result is Negative. Fact Sheet for Patients:  StrictlyIdeas.no Fact Sheet for Healthcare Providers:  BankingDealers.co.za This test is not yet approved or cleared by the [place] FDA and has been authorized for detection and/or diagnosis of SARS-CoV-2 by FDA under an Emergency Use Authorization (EUA).  This EUA will remain in effect (meaning this test can be used) for the duration of the COVID-19 declaration under Section 564(b)(1) of the Act, 21 U.S.C. section 360bbb-3(b)(1), unless the authorization is terminated or revoked sooner. Performed at [facility], [address] [place], [place] [address]   Culture, blood (routine x 2)     Status: None   Collection Time: 12/27/18  9:54 AM   Specimen: BLOOD  Result Value Ref Range Status   Specimen Description BLOOD RIGHT [facility]  Final   Special Requests BOTTLES DRAWN AEROBIC AND ANAEROBIC  Final   Culture   Final    NO GROWTH 5 DAYS Performed at [facility], [address] [place], [place] [address]    Report Status 01/01/2019 FINAL  Final  Culture, blood (routine x 2)     Status: None   Collection Time: 12/27/18 10:01 AM   Specimen: BLOOD  Result Value Ref Range Status   Specimen Description BLOOD RIGHT ARM  Final   Special Requests BOTTLES DRAWN AEROBIC AND ANAEROBIC  Final   Culture   Final    NO GROWTH 5 DAYS Performed at [facility], [address], [place], [place] [address]    Report Status 01/01/2019 FINAL  Final  MRSA PCR Screening     Status: None   Collection Time: 12/27/18 12:21 PM   Specimen: Nasal Mucosa; Nasopharyngeal  Result Value Ref Range Status   MRSA by PCR NEGATIVE NEGATIVE Final    Comment:        The GeneXpert MRSA Assay (FDA approved for NASAL specimens only), is one component of a comprehensive MRSA colonization surveillance program. It is not intended to diagnose MRSA infection nor to guide or monitor treatment for MRSA infections. Performed at [facility] [address] [place], [place] [address]     RADIOLOGY:  No  results found.   Management plans discussed with the patient, family and they are in agreement.  CODE STATUS: Full Code   TOTAL TIME TAKING CARE OF THIS PATIENT: 37 minutes.    Marene Gilliam M.D on 01/01/2019 at 10:13 AM  Between 7am to 6pm - Pager - [phone]  After 6pm go to [email or web address] -  password Airline pilot  [facility]  Office  [phone]  CC: Primary care physician; Tracie Harrier, MD   Note: This dictation was prepared with Dragon dictation along with smaller phrase technology. Any transcriptional errors that result from this process are unintentional.

## 2019-01-01 NOTE — Progress Notes (Signed)
Ambulated around nursing station / tolerated well/ Discharge instructions explained to pt/ verbalized an understanding/ ivs and tele removed/ RX given to pt/ transported off unit via wheelchair.

## 2019-01-01 NOTE — Progress Notes (Signed)
Cardiovascular and Pulmonary Nurse Navigator Note:   68 y.o.malewith a known history ofGERD and hypertension, 1 pack a day and history of drug abuse in the past who presented to the ED with worsening SOB.  Patient called EMS as he could not breathe and feeling extremely tight in his chest. Patient was given IV Solu-Medrol by the EMS and eventually patient is placed on BiPAP for worsening of breathing in the emergency department. Chest x-ray with possible edema versus interstitial lung disease and troponin initially was 22.Hospitalist team is called to admit the patient. Covidtest was negative  Active Problem List this Admission:   # Acute hypoxic respiratory failure 2/2 COPD exacerbation  #Acute bronchitis Treated with IV Rocephin and bronchodilator treatment  #Non-ST MI elevation  Patient being followed by cardiologist Dr. Nehemiah Massed. Elevated troponin consistent with non-ST MI elevation.Patient seen by cardiologist and had cardiac catheterization done with stent placement of proximal LAD.  Patient discharging on Brilinta, Lipitor, Coreg, Aspirin, Clonidine, and Nicotine Patches.  Patient to follow-up with Dr. Nehemiah Massed upon discharge.    Acute systolic CHF exacerbation;most likely present on admission.  2D echocardiogram done with ejection fraction of 30 to 35%. Left ventricular hypokinesis with severe hypokinesis of the anterior, apical, lateral regions and mid to distal inferior walls.  Patient was treated with IV Lasix during this admission.  Adequately diuresed.  Lasix had to be placed on hold due to development of acute kidney injury.  Without SOB this a.m.    #Essential hypertension Blood pressure better controlled  #Anxiety Stable this time.  #Tobacco abuse disorder Counseled patient to quit smoking at the time of admission.  Nicotine patch.  #History of alcohol abuse with alcohol withdrawal. Resolved Patient on alcohol withdrawal protocol.  #Acute kidney  injury Multifactorial secondary to recent diuresis with Lasix in addition with contrast exposure during recent cardiac catheterization.  Patient insisted on being discharged yesterday but agreed to stay 1 more day till today.  I discussed with cardiologist Dr. Nehemiah Massed who stated he expect improvement in renal function.  He will follow-up with patient's renal function post discharge from the hospital.  Appointment also made to follow-up with primary care physician to reevaluate renal function.  Avoid nephrotoxic agents.  Procedures LEFT HEART CATH AND CORONARY ANGIOGRAPHY  Conclusion    Prox LAD lesion is 90% stenosed.  Prox Cx to Mid Cx lesion is 60% stenosed.  Prox RCA lesion is 20% stenosed with 20% stenosed side branch in RV Branch.   68 year old male with essential hypertension mixed hyperlipidemia tobacco abuse with COPD having acute respiratory event with a non-ST elevation myocardial infarction  Apical hypokinesis with ejection fraction of 35 to 40%  Critical proximal left anterior descending artery stenosis of 90% 60% proximal circumflex to obtuse marginal artery Minimal diffuse atherosclerosis of the right coronary artery   Plan PCI and stent placement of left anterior descending artery Dual antiplatelet therapy Cessation of tobacco abuse High intensity cholesterol therapy Hypertension control Cardiac rehabilitation   Procedures  CORONARY STENT INTERVENTION  Conclusion    Prox LAD lesion is 90% stenosed.  Prox Cx to Mid Cx lesion is 60% stenosed.  Prox RCA lesion is 20% stenosed with 20% stenosed side branch in RV Branch.  A drug-eluting stent was successfully placed using a STENT RESOLUTE ONYX 3.5X15.  Post intervention, there is a 0% residual stenosis.   Contusion Successful PCI and stent with DES to proximal LAD from 95 down to 0% 3.5 x 15 mm DES stent resolute Onyx to  13 atm TIMI-3 flow before and after    Study Result Result status: Final  result     ECHOCARDIOGRAM REPORT    Patient Name:  Theodore Obrien Date of Exam: 12/28/2018 Medical Rec #:  [id number]     Height:       70.0 in Accession #:    [id number]    Weight:       187.4 lb Date of Birth:  1950/05/27      BSA:          2.03 m Patient Age:    57 years      BP:           136/94 mmHg Patient Gender: M             HR:           78 bpm. Exam Location:  [facility]    Procedure: 2D Echo, Cardiac Doppler and Color Doppler  Indications:     Acute respiratory insufficiency 518.82   History:         Patient has no prior history of Echocardiogram examinations.                  Risk Factors: Hypertension.   Sonographer:     Sherrie Sport RDCS (AE) Referring Phys:  [id number] Awilda Bill Diagnosing Phys: Ida Rogue MD    Sonographer Comments: No parasternal window and no apical window. The only view obtainable was subcostal. IMPRESSIONS    1. The left ventricle has moderate-severely reduced systolic function, with an ejection fraction of 30-35%. The cavity size was normal. Left ventricular diffuse hypokinesis with severe hypokinesis of the anterior, apical, lateral regions, mid to distal  inferior walls.  2. The right ventricle has moderately reduced systolic function. The cavity was mildly enlarged. There is no increase in right ventricular wall thickness.Unable to estimate RVSP.   EDUCATION:   "Heart Attack Bouncing Back" booklet given and reviewed with patient. Discussed the definition of CAD. Reviewed the location of CAD and where his stent was placed. Informed patient he will be given a stent card. Explained the purpose of the stent card. Instructed patient to keep stent card in his wallet.  ? Discussed modifiable risk factors including controlling blood pressure, cholesterol, and blood sugar; following heart healthy diet; maintaining healthy weight; exercise; and smoking cessation.   ? Discussed cardiac medications including rationale for taking, mechanisms of  action, and side effects. Stressed the importance of taking medications as prescribed. Patient being discharged on ASA, Brilinta, Coreg, Clonidine, Lipitor.  Brilinta 30 day coupon given to patient.  Patient to discuss with Dr. Nehemiah Massed about switching to Plavix after one month on Brilinta due to cost of Brilinta.   ? Discussed emergency plan for heart attack symptoms. Patient verbalized understanding of need to call 911 and not to drive himself to ER if having cardiac symptoms (SOB was his main symptom)  / chest pain.   ? Diet of low sodium, low fat, low cholesterol heart healthy modified diet discussed. Information on diet provided.    ? Smoking Cessation - Patient is a CURRENT every day smoker. Patient has been advised to quit by his cardiologist.  Patient reports smoking one pack per day prior to admission.   Patient is reporting that he he quit smoking the day he was admitted.   Discussed riding out cravings and what he was going to do instead of smoking like walk, play the drums, chew on a straw, etc.  Information on smoking cessation provided to patient.   ? Exercise - Benefits of exercised discussed. Patient reports a very active lifestyle where he plays the drums for 45 minutes without stopping, does home repairs and renovations, etc.   Informed patient his cardiologist has referred him to outpatient Cardiac Rehab. An overview of the program was provided. Patient has declined to participate in Cardiac Rehab, as patient does not have the money pay 20% co-pay for the program.  Patient needs any extra money to pay for his medications.  Discussed the American Heart Association guidelines for exercise for all adults of 150 minutes per week of moderate exercise.    Patient appreciative of the information.  ? Roanna Epley, RN, BSN, [place]  [facility] Cardiac & Pulmonary Rehab  Cardiovascular & Pulmonary Nurse Navigator  Direct Line: [phone]  Department Phone #: [phone] Fax:  [phone]  Email Address: [email or web address]
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
## 2019-01-08 DIAGNOSIS — E782 Mixed hyperlipidemia: Secondary | ICD-10-CM | POA: Insufficient documentation

## 2019-03-25 ENCOUNTER — Other Ambulatory Visit: Payer: Self-pay | Admitting: Urology

## 2019-03-25 DIAGNOSIS — N200 Calculus of kidney: Secondary | ICD-10-CM

## 2019-08-20 ENCOUNTER — Ambulatory Visit: Payer: Self-pay | Admitting: General Surgery

## 2019-08-20 NOTE — H&P (View-Only) (Signed)
PATIENT PROFILE: Theodore Obrien is a 69 y.o. male who presents to the Clinic for consultation at the request of Dr. Ginette Pitman for evaluation of right inguinal hernia.  PCP:  Theodore Glatter, MD  HISTORY OF PRESENT ILLNESS: Theodore Obrien reports having a right inguinal hernia since 2-3 weeks ago. Patient reports that is getting bigger and is causing pain on the right groin. Pain does not radiates to other part of the body. Pain aggravating by standing and doing heavy lifting. Pain improving by resting. Denies abdominal distention, nausea or vomiting. Also complain of intermittent pain on the left groin but denies bulging.   Patient reports having left inguinal hernia repair. I was able to evaluate the chart and he had open left femoral and direct inguinal hernia repair with mesh.    PROBLEM LIST:        Problem List  Date Reviewed: 08/14/2019       Noted   Presence of stent in LAD coronary artery 02/12/2019   Ex-smoker 01/10/2019   History of placement of stent in LAD coronary artery 01/10/2019   Pulmonary emphysema (CMS-HCC) 01/10/2019   Non-ST elevation myocardial infarction (NSTEMI), subendocardial infarction, subsequent episode of care (CMS-HCC) 01/08/2019   Hyperlipidemia, mixed 01/08/2019   Coronary artery disease involving native coronary artery of native heart 01/08/2019   Overview    Sp apical mi with pci and stent of lad 01/2019 with 60% lcx      Drug-induced peripheral neuropathy (CMS-HCC) 07/23/2015   Essential hypertension, benign 07/23/2015   Anxiety state 07/23/2015      GENERAL REVIEW OF SYSTEMS:   General ROS: negative for - chills, fatigue, fever, weight gain or weight loss Allergy and Immunology ROS: negative for - hives  Hematological and Lymphatic ROS: negative for - bleeding problems or bruising, negative for palpable nodes Endocrine ROS: negative for - heat or cold intolerance, hair changes Respiratory ROS: negative for - cough, shortness of  breath or wheezing Cardiovascular ROS: no chest pain or palpitations GI ROS: negative for nausea, vomiting, abdominal pain, diarrhea, constipation Musculoskeletal ROS: negative for - joint swelling or muscle pain Neurological ROS: negative for - confusion, syncope Dermatological ROS: negative for pruritus and rash Psychiatric: negative for anxiety, depression, difficulty sleeping and memory loss  MEDICATIONS: Current Medications        Current Outpatient Medications  Medication Sig Dispense Refill  . albuterol 90 mcg/actuation inhaler Inhale 2 inhalations into the lungs every 6 (six) hours as needed for Wheezing 1 Inhaler 5  . ALPRAZolam (XANAX) 0.25 MG tablet Take 1 tablet (0.25 mg total) by mouth 2 (two) times daily as needed for Sleep 60 tablet 1  . amLODIPine (NORVASC) 5 MG tablet Take 1 tablet (5 mg total) by mouth once daily 90 tablet 4  . aspirin 81 MG EC tablet Take 81 mg by mouth once daily       . atorvastatin (LIPITOR) 40 MG tablet Take 1 tablet (40 mg total) by mouth once daily for 30 days 90 tablet 3  . carvediloL (COREG) 3.125 MG tablet Take 1 tablet (3.125 mg total) by mouth 2 (two) times daily with meals 180 tablet 3  . cloNIDine HCL (CATAPRES) 0.1 MG tablet Take 1 tablet (0.1 mg total) by mouth every 8 (eight) hours 90 tablet 5  . clopidogreL (PLAVIX) 75 mg tablet Take 1 tablet (75 mg total) by mouth once daily 90 tablet 4   No current facility-administered medications for this visit.      ALLERGIES: Linezolid  PAST MEDICAL HISTORY:     Past Medical History:  Diagnosis Date  . Carpal tunnel syndrome   . Hernia of appendix   . Hypertension     PAST SURGICAL HISTORY:      Past Surgical History:  Procedure Laterality Date  . carpal tunnel surgery Left   . knee surgery     from broken leg   Left femoral hernia repair with mesh  (Dr. Felton Clinton)  FAMILY HISTORY:      Family History  Problem Relation Age of Onset  . Leukemia Father       SOCIAL HISTORY: Social History          Socioeconomic History  . Marital status: Divorced    Spouse name: Not on file  . Number of children: Not on file  . Years of education: Not on file  . Highest education level: Not on file  Occupational History  . Not on file  Tobacco Use  . Smoking status: Current Some Day Smoker    Packs/day: 0.50    Types: Cigarettes  . Smokeless tobacco: Never Used  Substance and Sexual Activity  . Alcohol use: Yes    Alcohol/week: 0.0 standard drinks    Comment: rare  . Drug use: No  . Sexual activity: Not on file  Other Topics Concern  . Not on file  Social History Narrative  . Not on file   Social Determinants of Health      Financial Resource Strain:   . Difficulty of Paying Living Expenses:   Food Insecurity:   . Worried About Charity fundraiser in the Last Year:   . Arboriculturist in the Last Year:   Transportation Needs:   . Film/video editor (Medical):   Marland Kitchen Lack of Transportation (Non-Medical):       PHYSICAL EXAM:    Vitals:   08/20/19 1009  BP: 135/70  Pulse: 62   Body mass index is 23.48 kg/m. Weight: 80.7 kg (178 lb)   GENERAL: Alert, active, oriented x3  HEENT: Pupils equal reactive to light. Extraocular movements are intact. Sclera clear. Palpebral conjunctiva normal red color.Pharynx clear.  NECK: Supple with no palpable mass and no adenopathy.  LUNGS: Sound clear with no rales rhonchi or wheezes.  HEART: Regular rhythm S1 and S2 without murmur.  ABDOMEN: Soft and depressible, nontender with no palpable mass, no hepatomegaly. Soft and reducible right inguinal hernia.   EXTREMITIES: Well-developed well-nourished symmetrical with no dependent edema.  NEUROLOGICAL: Awake alert oriented, facial expression symmetrical, moving all extremities.  REVIEW OF DATA: I have reviewed the following data today:      Initial consult on 08/20/2019  Component Date Value  . Glucose  08/20/2019 131*  . Sodium 08/20/2019 138   . Potassium 08/20/2019 5.3*  . Chloride 08/20/2019 101   . Carbon Dioxide (CO2) 08/20/2019 34.3*  . Urea Nitrogen (BUN) 08/20/2019 17   . Creatinine 08/20/2019 1.0   . Glomerular Filtration Ra* 08/20/2019 75   . Calcium 08/20/2019 9.1   . AST  08/20/2019 18   . ALT  08/20/2019 18   . Alk Phos (alkaline Phosp* 08/20/2019 66   . Albumin 08/20/2019 4.3   . Bilirubin, Total 08/20/2019 1.5*  . Protein, Total 08/20/2019 6.6   . A/G Ratio 08/20/2019 1.9   . WBC (White Blood Cell Co* 08/20/2019 5.5   . RBC (Red Blood Cell Coun* 08/20/2019 4.83   . Hemoglobin 08/20/2019 14.0*  . Hematocrit 08/20/2019 44.3   .  MCV (Mean Corpuscular Vo* 08/20/2019 91.7   . MCH (Mean Corpuscular He* 08/20/2019 29.0   . MCHC (Mean Corpuscular H* 08/20/2019 31.6*  . Platelet Count 08/20/2019 162   . RDW-CV (Red Cell Distrib* 08/20/2019 12.6   . MPV (Mean Platelet Volum* 08/20/2019 10.0   . Neutrophils 08/20/2019 3.39   . Lymphocytes 08/20/2019 1.25   . Monocytes 08/20/2019 0.53   . Eosinophils 08/20/2019 0.35   . Basophils 08/20/2019 0.02   . Neutrophil % 08/20/2019 61.1   . Lymphocyte % 08/20/2019 22.6   . Monocyte % 08/20/2019 9.6   . Eosinophil % 08/20/2019 6.3*  . Basophil% 08/20/2019 0.4   . Immature Granulocyte % 08/20/2019 0.0   . Immature Granulocyte Cou* 08/20/2019 0.00   Ancillary Procedure on 07/29/2019  Component Date Value  . LV Ejection Fraction (%) 07/29/2019 50   . Aortic Valve Regurgitati* 07/29/2019 none   . Aortic Valve Stenosis Gr* 07/29/2019 none   . Aortic Valve Max Velocit* 07/29/2019 1.2   . Mitral Valve Regurgitati* 07/29/2019 mild   . Mitral Valve Stenosis Gr* 07/29/2019 none   . Tricuspid Valve Regurgit* 07/29/2019 mild   . Tricuspid Valve Regurgit* 07/29/2019 2.8   . Right Ventricle Systolic* 83/35/8251 89.8   . LV End Diastolic Diamete* 42/01/3127 5.5   . LV End Systolic Diameter* 11/88/6773 3.8   . LV Septum Wall Thickness*  07/29/2019 1.1   . LV Posterior Wall Thickn* 07/29/2019 1.1   . Left Atrium Diameter (cm) 07/29/2019 4.5      ASSESSMENT: Mr. Tata is a 69 y.o. male presenting for consultation for right inguinal hernia.    The patient presents with a symptomatic, reducible inguinal hernia. Patient was oriented about the diagnosis of inguinal hernia and its implication. The patient was oriented about the treatment alternatives (observation vs surgical repair). Due to patient symptoms, repair is recommended. Patient oriented about the surgical procedure, the use of mesh and its risk of complications such as: infection, bleeding, injury to vas deference, vasculature and testicle, injury to bowel or bladder, and chronic pain.  Due to the intermittent pain on the left groin, this can be evaluated intra operatively and repair if hernia is identified. He agreed.   Non-recurrent unilateral inguinal hernia without obstruction or gangrene [K40.90]  PLAN: 1. Robotic assisted laparoscopic right vs possible bilateral inguinal hernia repair with mesh ([id number] x2) 2.  CBC, CMP 3.  Avoid taking clopidogrel and aspirin 7 days before procedure 4.  Cardiology clearance Clearance 5.  Contact us if has any question or concern.   Patient verbalized understanding, all questions were answered, and were agreeable with the plan outlined above.    Herbert Pun, MD  Electronically signed by Herbert Pun, MD

## 2019-08-20 NOTE — H&P (Signed)
PATIENT PROFILE: Theodore Obrien is a 69 y.o. male who presents to the Clinic for consultation at the request of Dr. Ginette Pitman for evaluation of right inguinal hernia.  PCP:  Azzie Glatter, MD  HISTORY OF PRESENT ILLNESS: Mr. Sprinkle reports having a right inguinal hernia since 2-3 weeks ago. Patient reports that is getting bigger and is causing pain on the right groin. Pain does not radiates to other part of the body. Pain aggravating by standing and doing heavy lifting. Pain improving by resting. Denies abdominal distention, nausea or vomiting. Also complain of intermittent pain on the left groin but denies bulging.   Patient reports having left inguinal hernia repair. I was able to evaluate the chart and he had open left femoral and direct inguinal hernia repair with mesh.    PROBLEM LIST:        Problem List  Date Reviewed: 08/14/2019       Noted   Presence of stent in LAD coronary artery 02/12/2019   Ex-smoker 01/10/2019   History of placement of stent in LAD coronary artery 01/10/2019   Pulmonary emphysema (CMS-HCC) 01/10/2019   Non-ST elevation myocardial infarction (NSTEMI), subendocardial infarction, subsequent episode of care (CMS-HCC) 01/08/2019   Hyperlipidemia, mixed 01/08/2019   Coronary artery disease involving native coronary artery of native heart 01/08/2019   Overview    Sp apical mi with pci and stent of lad 01/2019 with 60% lcx      Drug-induced peripheral neuropathy (CMS-HCC) 07/23/2015   Essential hypertension, benign 07/23/2015   Anxiety state 07/23/2015      GENERAL REVIEW OF SYSTEMS:   General ROS: negative for - chills, fatigue, fever, weight gain or weight loss Allergy and Immunology ROS: negative for - hives  Hematological and Lymphatic ROS: negative for - bleeding problems or bruising, negative for palpable nodes Endocrine ROS: negative for - heat or cold intolerance, hair changes Respiratory ROS: negative for - cough, shortness of  breath or wheezing Cardiovascular ROS: no chest pain or palpitations GI ROS: negative for nausea, vomiting, abdominal pain, diarrhea, constipation Musculoskeletal ROS: negative for - joint swelling or muscle pain Neurological ROS: negative for - confusion, syncope Dermatological ROS: negative for pruritus and rash Psychiatric: negative for anxiety, depression, difficulty sleeping and memory loss  MEDICATIONS: Current Medications        Current Outpatient Medications  Medication Sig Dispense Refill  . albuterol 90 mcg/actuation inhaler Inhale 2 inhalations into the lungs every 6 (six) hours as needed for Wheezing 1 Inhaler 5  . ALPRAZolam (XANAX) 0.25 MG tablet Take 1 tablet (0.25 mg total) by mouth 2 (two) times daily as needed for Sleep 60 tablet 1  . amLODIPine (NORVASC) 5 MG tablet Take 1 tablet (5 mg total) by mouth once daily 90 tablet 4  . aspirin 81 MG EC tablet Take 81 mg by mouth once daily       . atorvastatin (LIPITOR) 40 MG tablet Take 1 tablet (40 mg total) by mouth once daily for 30 days 90 tablet 3  . carvediloL (COREG) 3.125 MG tablet Take 1 tablet (3.125 mg total) by mouth 2 (two) times daily with meals 180 tablet 3  . cloNIDine HCL (CATAPRES) 0.1 MG tablet Take 1 tablet (0.1 mg total) by mouth every 8 (eight) hours 90 tablet 5  . clopidogreL (PLAVIX) 75 mg tablet Take 1 tablet (75 mg total) by mouth once daily 90 tablet 4   No current facility-administered medications for this visit.      ALLERGIES: Linezolid  PAST MEDICAL HISTORY:     Past Medical History:  Diagnosis Date  . Carpal tunnel syndrome   . Hernia of appendix   . Hypertension     PAST SURGICAL HISTORY:      Past Surgical History:  Procedure Laterality Date  . carpal tunnel surgery Left   . knee surgery     from broken leg   Left femoral hernia repair with mesh  (Dr. Felton Clinton)  FAMILY HISTORY:      Family History  Problem Relation Age of Onset  . Leukemia Father       SOCIAL HISTORY: Social History          Socioeconomic History  . Marital status: Divorced    Spouse name: Not on file  . Number of children: Not on file  . Years of education: Not on file  . Highest education level: Not on file  Occupational History  . Not on file  Tobacco Use  . Smoking status: Current Some Day Smoker    Packs/day: 0.50    Types: Cigarettes  . Smokeless tobacco: Never Used  Substance and Sexual Activity  . Alcohol use: Yes    Alcohol/week: 0.0 standard drinks    Comment: rare  . Drug use: No  . Sexual activity: Not on file  Other Topics Concern  . Not on file  Social History Narrative  . Not on file   Social Determinants of Health      Financial Resource Strain:   . Difficulty of Paying Living Expenses:   Food Insecurity:   . Worried About Charity fundraiser in the Last Year:   . Arboriculturist in the Last Year:   Transportation Needs:   . Film/video editor (Medical):   Marland Kitchen Lack of Transportation (Non-Medical):       PHYSICAL EXAM:    Vitals:   08/20/19 1009  BP: 135/70  Pulse: 62   Body mass index is 23.48 kg/m. Weight: 80.7 kg (178 lb)   GENERAL: Alert, active, oriented x3  HEENT: Pupils equal reactive to light. Extraocular movements are intact. Sclera clear. Palpebral conjunctiva normal red color.Pharynx clear.  NECK: Supple with no palpable mass and no adenopathy.  LUNGS: Sound clear with no rales rhonchi or wheezes.  HEART: Regular rhythm S1 and S2 without murmur.  ABDOMEN: Soft and depressible, nontender with no palpable mass, no hepatomegaly. Soft and reducible right inguinal hernia.   EXTREMITIES: Well-developed well-nourished symmetrical with no dependent edema.  NEUROLOGICAL: Awake alert oriented, facial expression symmetrical, moving all extremities.  REVIEW OF DATA: I have reviewed the following data today:      Initial consult on 08/20/2019  Component Date Value  . Glucose  08/20/2019 131*  . Sodium 08/20/2019 138   . Potassium 08/20/2019 5.3*  . Chloride 08/20/2019 101   . Carbon Dioxide (CO2) 08/20/2019 34.3*  . Urea Nitrogen (BUN) 08/20/2019 17   . Creatinine 08/20/2019 1.0   . Glomerular Filtration Ra* 08/20/2019 75   . Calcium 08/20/2019 9.1   . AST  08/20/2019 18   . ALT  08/20/2019 18   . Alk Phos (alkaline Phosp* 08/20/2019 66   . Albumin 08/20/2019 4.3   . Bilirubin, Total 08/20/2019 1.5*  . Protein, Total 08/20/2019 6.6   . A/G Ratio 08/20/2019 1.9   . WBC (White Blood Cell Co* 08/20/2019 5.5   . RBC (Red Blood Cell Coun* 08/20/2019 4.83   . Hemoglobin 08/20/2019 14.0*  . Hematocrit 08/20/2019 44.3   .  MCV (Mean Corpuscular Vo* 08/20/2019 91.7   . MCH (Mean Corpuscular He* 08/20/2019 29.0   . MCHC (Mean Corpuscular H* 08/20/2019 31.6*  . Platelet Count 08/20/2019 162   . RDW-CV (Red Cell Distrib* 08/20/2019 12.6   . MPV (Mean Platelet Volum* 08/20/2019 10.0   . Neutrophils 08/20/2019 3.39   . Lymphocytes 08/20/2019 1.25   . Monocytes 08/20/2019 0.53   . Eosinophils 08/20/2019 0.35   . Basophils 08/20/2019 0.02   . Neutrophil % 08/20/2019 61.1   . Lymphocyte % 08/20/2019 22.6   . Monocyte % 08/20/2019 9.6   . Eosinophil % 08/20/2019 6.3*  . Basophil% 08/20/2019 0.4   . Immature Granulocyte % 08/20/2019 0.0   . Immature Granulocyte Cou* 08/20/2019 0.00   Ancillary Procedure on 07/29/2019  Component Date Value  . LV Ejection Fraction (%) 07/29/2019 50   . Aortic Valve Regurgitati* 07/29/2019 none   . Aortic Valve Stenosis Gr* 07/29/2019 none   . Aortic Valve Max Velocit* 07/29/2019 1.2   . Mitral Valve Regurgitati* 07/29/2019 mild   . Mitral Valve Stenosis Gr* 07/29/2019 none   . Tricuspid Valve Regurgit* 07/29/2019 mild   . Tricuspid Valve Regurgit* 07/29/2019 2.8   . Right Ventricle Systolic* 98/42/1031 28.1   . LV End Diastolic Diamete* 18/86/7737 5.5   . LV End Systolic Diameter* 36/68/1594 3.8   . LV Septum Wall Thickness*  07/29/2019 1.1   . LV Posterior Wall Thickn* 07/29/2019 1.1   . Left Atrium Diameter (cm) 07/29/2019 4.5      ASSESSMENT: Mr. Dykema is a 69 y.o. male presenting for consultation for right inguinal hernia.    The patient presents with a symptomatic, reducible inguinal hernia. Patient was oriented about the diagnosis of inguinal hernia and its implication. The patient was oriented about the treatment alternatives (observation vs surgical repair). Due to patient symptoms, repair is recommended. Patient oriented about the surgical procedure, the use of mesh and its risk of complications such as: infection, bleeding, injury to vas deference, vasculature and testicle, injury to bowel or bladder, and chronic pain.  Due to the intermittent pain on the left groin, this can be evaluated intra operatively and repair if hernia is identified. He agreed.   Non-recurrent unilateral inguinal hernia without obstruction or gangrene [K40.90]  PLAN: 1. Robotic assisted laparoscopic right vs possible bilateral inguinal hernia repair with mesh ([id number] x2) 2.  CBC, CMP 3.  Avoid taking clopidogrel and aspirin 7 days before procedure 4.  Cardiology clearance Clearance 5.  Contact us if has any question or concern.   Patient verbalized understanding, all questions were answered, and were agreeable with the plan outlined above.    Herbert Pun, MD  Electronically signed by Herbert Pun, MD

## 2019-08-22 ENCOUNTER — Encounter
Admission: RE | Admit: 2019-08-22 | Discharge: 2019-08-22 | Disposition: A | Discharge status: Home / Self Care | Payer: Medicare Other | Source: Ambulatory Visit | Attending: General Surgery | Admitting: General Surgery

## 2019-08-22 ENCOUNTER — Other Ambulatory Visit: Payer: Self-pay

## 2019-08-22 DIAGNOSIS — Z01818 Encounter for other preprocedural examination: Secondary | ICD-10-CM | POA: Insufficient documentation

## 2019-08-22 HISTORY — DX: Dyspnea, unspecified: R06.00

## 2019-08-22 HISTORY — DX: Personal history of urinary calculi: Z87.442

## 2019-08-22 NOTE — Patient Instructions (Addendum)
Your procedure is scheduled on: Wed. 4/28 Report to [place] To find out your arrival time please call [phone] between 1PM - 3PM on Tues 4/27  Remember: Instructions that are not followed completely may result in serious medical risk,  up to and including death, or upon the discretion of your surgeon and anesthesiologist your  surgery may need to be rescheduled.     _X__ 1. Do not eat food after midnight the night before your procedure.                 No gum chewing or hard candies. You may drink clear liquids up to 2 hours                 before you are scheduled to arrive for your surgery- DO not drink clear                 liquids within 2 hours of the start of your surgery.                 Clear Liquids include:  water, apple juice without pulp, clear Gatorade, G2 or                  Gatorade Zero (avoid Red/Purple/Blue), Black Coffee or Tea (Do not add                 anything to coffee or tea). _____2.   Complete the carbohydrate drink provided to you, 2 hours before arrival.  __X__2.  On the morning of surgery brush your teeth with toothpaste and water, you                may rinse your mouth with mouthwash if you wish.  Do not swallow any toothpaste of mouthwash.     _X__ 3.  No Alcohol for 24 hours before or after surgery.   ___ 4.  Do Not Smoke or use e-cigarettes For 24 Hours Prior to Your Surgery.                 Do not use any chewable tobacco products for at least 6 hours prior to                 Surgery.  _X__  5.  Do not use any recreational drugs (marijuana, cocaine, heroin, ecstacy, MDMA or other)                For at least one week prior to your surgery.  Combination of these drugs with anesthesia                May have life threatening results.  ____  6.  Bring all medications with you on the day of surgery if instructed.   _x___  7.  Notify your doctor if there is any change in your medical condition      (cold,  fever, infections).     Do not wear jewelry, Do not wear lotions,  You may wear deodorant. Do not shave 48 hours prior to surgery. Men may shave face and neck. Do not bring valuables to the hospital.    [facility] is not responsible for any belongings or valuables.  Contacts, dentures or bridgework may not be worn into surgery. Leave your suitcase in the car. After surgery it may be brought to your room. For patients admitted to the hospital, discharge time is determined by your treatment team.   Patients  discharged the day of surgery will not be allowed to drive home.   Make arrangements for someone to be with you for the first 24 hours of your Same Day Discharge.    Please read over the following fact sheets that you were given:     _x___ Take these medicines the morning of surgery with A SIP OF WATER:    1. cloNIDine (CATAPRES) 0.1 MG tablet  2. carvedilol (COREG) 3.125 MG tablet  3. atorvastatin (LIPITOR) 40 MG tablet  4.ALPRAZolam (XANAX) 0.25 MG tablet  If needed  5.  6.  ____ Fleet Enema (as directed)   _x___ Use CHG Soap (or wipes) as directed  ____ Use Benzoyl Peroxide Gel as instructed  __x__ Use inhalers on the day of surgery albuterol (PROVENTIL HFA) 108 (90 Base) MCG/ACT inhaler       Bring it with you.  ____ Stop metformin 2 days prior to surgery    ____ Take 1/2 of usual insulin dose the night before surgery. No insulin the morning          of surgery.   __x__ Stopped  Plavix on 4/20  _x___ Stop Anti-inflammatories    no ibuprofen aleve or aspirin until after surgery    May take tylenol   ____ Stop supplements until after surgery.    ____ Bring C-Pap to the hospital.

## 2019-08-26 ENCOUNTER — Other Ambulatory Visit
Admission: RE | Admit: 2019-08-26 | Discharge: 2019-08-26 | Disposition: A | Discharge status: Home / Self Care | Payer: Medicare Other | Source: Ambulatory Visit | Attending: General Surgery | Admitting: General Surgery

## 2019-08-26 ENCOUNTER — Other Ambulatory Visit: Payer: Self-pay

## 2019-08-26 DIAGNOSIS — Z20822 Contact with and (suspected) exposure to covid-19: Secondary | ICD-10-CM | POA: Diagnosis not present

## 2019-08-26 DIAGNOSIS — Z01812 Encounter for preprocedural laboratory examination: Secondary | ICD-10-CM | POA: Insufficient documentation

## 2019-08-27 LAB — SARS CORONAVIRUS 2 (TAT 6-24 HRS): SARS Coronavirus 2: NEGATIVE

## 2019-08-28 ENCOUNTER — Observation Stay
Admission: RE | Admit: 2019-08-28 | Discharge: 2019-08-29 | Disposition: A | Discharge status: Home / Self Care | Payer: Medicare Other | Attending: General Surgery | Admitting: General Surgery

## 2019-08-28 ENCOUNTER — Encounter: Payer: Self-pay | Admitting: General Surgery

## 2019-08-28 ENCOUNTER — Ambulatory Visit: Payer: Medicare Other | Admitting: Anesthesiology

## 2019-08-28 ENCOUNTER — Encounter
Admission: RE | Disposition: A | Discharge status: Home / Self Care | Payer: Self-pay | Source: Home / Self Care | Attending: General Surgery

## 2019-08-28 ENCOUNTER — Ambulatory Visit: Payer: Medicare Other

## 2019-08-28 ENCOUNTER — Other Ambulatory Visit: Payer: Self-pay

## 2019-08-28 DIAGNOSIS — R7981 Abnormal blood-gas level: Secondary | ICD-10-CM

## 2019-08-28 DIAGNOSIS — I252 Old myocardial infarction: Secondary | ICD-10-CM | POA: Diagnosis not present

## 2019-08-28 DIAGNOSIS — I160 Hypertensive urgency: Secondary | ICD-10-CM | POA: Diagnosis not present

## 2019-08-28 DIAGNOSIS — Z7982 Long term (current) use of aspirin: Secondary | ICD-10-CM | POA: Diagnosis not present

## 2019-08-28 DIAGNOSIS — E785 Hyperlipidemia, unspecified: Secondary | ICD-10-CM | POA: Insufficient documentation

## 2019-08-28 DIAGNOSIS — Z7902 Long term (current) use of antithrombotics/antiplatelets: Secondary | ICD-10-CM | POA: Insufficient documentation

## 2019-08-28 DIAGNOSIS — K4190 Unilateral femoral hernia, without obstruction or gangrene, not specified as recurrent: Secondary | ICD-10-CM | POA: Insufficient documentation

## 2019-08-28 DIAGNOSIS — K4090 Unilateral inguinal hernia, without obstruction or gangrene, not specified as recurrent: Principal | ICD-10-CM | POA: Insufficient documentation

## 2019-08-28 DIAGNOSIS — I2510 Atherosclerotic heart disease of native coronary artery without angina pectoris: Secondary | ICD-10-CM

## 2019-08-28 DIAGNOSIS — Z79899 Other long term (current) drug therapy: Secondary | ICD-10-CM | POA: Diagnosis not present

## 2019-08-28 DIAGNOSIS — I10 Essential (primary) hypertension: Secondary | ICD-10-CM

## 2019-08-28 DIAGNOSIS — F17210 Nicotine dependence, cigarettes, uncomplicated: Secondary | ICD-10-CM | POA: Insufficient documentation

## 2019-08-28 DIAGNOSIS — D176 Benign lipomatous neoplasm of spermatic cord: Secondary | ICD-10-CM | POA: Diagnosis not present

## 2019-08-28 DIAGNOSIS — J439 Emphysema, unspecified: Secondary | ICD-10-CM | POA: Insufficient documentation

## 2019-08-28 DIAGNOSIS — Z955 Presence of coronary angioplasty implant and graft: Secondary | ICD-10-CM | POA: Diagnosis not present

## 2019-08-28 HISTORY — PX: XI ROBOTIC ASSISTED INGUINAL HERNIA REPAIR WITH MESH: SHX6706

## 2019-08-28 LAB — CBC
HCT: 45.7 % (ref 39.0–52.0)
Hemoglobin: 14.8 g/dL (ref 13.0–17.0)
MCH: 28.8 pg (ref 26.0–34.0)
MCHC: 32.4 g/dL (ref 30.0–36.0)
MCV: 88.9 fL (ref 80.0–100.0)
Platelets: 166 10*3/uL (ref 150–400)
RBC: 5.14 MIL/uL (ref 4.22–5.81)
RDW: 12.2 % (ref 11.5–15.5)
WBC: 9 10*3/uL (ref 4.0–10.5)
nRBC: 0 % (ref 0.0–0.2)

## 2019-08-28 LAB — BASIC METABOLIC PANEL
Anion gap: 10 (ref 5–15)
BUN: 23 mg/dL (ref 8–23)
CO2: 28 mmol/L (ref 22–32)
Calcium: 9.2 mg/dL (ref 8.9–10.3)
Chloride: 99 mmol/L (ref 98–111)
Creatinine, Ser: 1.23 mg/dL (ref 0.61–1.24)
GFR calc Af Amer: >60 mL/min (ref >60)
GFR calc non Af Amer: >60 mL/min (ref >60)
Glucose, Bld: 162 mg/dL — ABNORMAL HIGH (ref 70–99)
Potassium: 5.4 mmol/L — ABNORMAL HIGH (ref 3.5–5.1)
Sodium: 137 mmol/L (ref 135–145)

## 2019-08-28 SURGERY — REPAIR, HERNIA, INGUINAL, ROBOT-ASSISTED, LAPAROSCOPIC, USING MESH
Anesthesia: General | Site: Groin | Laterality: Bilateral

## 2019-08-28 MED ORDER — FENTANYL CITRATE (PF) 100 MCG/2ML IJ SOLN
INTRAMUSCULAR | Status: DC | PRN
  Administered 2019-08-28 (×4): 50 ug via INTRAVENOUS

## 2019-08-28 MED ORDER — ENOXAPARIN SODIUM 40 MG/0.4ML ~~LOC~~ SOLN
40.0000 mg | SUBCUTANEOUS | Status: DC
  Administered 2019-08-29: 40 mg via SUBCUTANEOUS
  Filled 2019-08-28: qty 0.4

## 2019-08-28 MED ORDER — DEXAMETHASONE SODIUM PHOSPHATE 10 MG/ML IJ SOLN
INTRAMUSCULAR | Status: AC
  Filled 2019-08-28: qty 1

## 2019-08-28 MED ORDER — EPHEDRINE 5 MG/ML INJ
INTRAVENOUS | Status: AC
  Filled 2019-08-28: qty 10

## 2019-08-28 MED ORDER — ALBUTEROL SULFATE (2.5 MG/3ML) 0.083% IN NEBU
2.5000 mg | INHALATION_SOLUTION | Freq: Four times a day (QID) | RESPIRATORY_TRACT | Status: DC
  Administered 2019-08-28 – 2019-08-29 (×3): 2.5 mg via RESPIRATORY_TRACT
  Filled 2019-08-28 (×2): qty 3

## 2019-08-28 MED ORDER — LABETALOL HCL 5 MG/ML IV SOLN
INTRAVENOUS | Status: AC
  Administered 2019-08-28: 10 mg via INTRAVENOUS
  Filled 2019-08-28: qty 4

## 2019-08-28 MED ORDER — FENTANYL CITRATE (PF) 100 MCG/2ML IJ SOLN
INTRAMUSCULAR | Status: AC
  Filled 2019-08-28: qty 2

## 2019-08-28 MED ORDER — LABETALOL HCL 5 MG/ML IV SOLN: 10.0000 mg | Freq: Once | INTRAVENOUS | Status: AC

## 2019-08-28 MED ORDER — LIDOCAINE HCL (CARDIAC) PF 100 MG/5ML IV SOSY
PREFILLED_SYRINGE | INTRAVENOUS | Status: DC | PRN
  Administered 2019-08-28: 80 mg via INTRAVENOUS
  Administered 2019-08-28: 20 mg via INTRAVENOUS

## 2019-08-28 MED ORDER — ROCURONIUM BROMIDE 100 MG/10ML IV SOLN
INTRAVENOUS | Status: DC | PRN
  Administered 2019-08-28: 20 mg via INTRAVENOUS
  Administered 2019-08-28 (×2): 10 mg via INTRAVENOUS
  Administered 2019-08-28: 30 mg via INTRAVENOUS
  Administered 2019-08-28: 10 mg via INTRAVENOUS
  Administered 2019-08-28: 50 mg via INTRAVENOUS
  Administered 2019-08-28: 10 mg via INTRAVENOUS
  Administered 2019-08-28: 20 mg via INTRAVENOUS

## 2019-08-28 MED ORDER — HYDRALAZINE HCL 20 MG/ML IJ SOLN
INTRAMUSCULAR | Status: AC
  Filled 2019-08-28: qty 1

## 2019-08-28 MED ORDER — SUGAMMADEX SODIUM 500 MG/5ML IV SOLN
INTRAVENOUS | Status: DC | PRN
  Administered 2019-08-28: 200 mg via INTRAVENOUS

## 2019-08-28 MED ORDER — PROPOFOL 10 MG/ML IV BOLUS
INTRAVENOUS | Status: AC
  Filled 2019-08-28: qty 40

## 2019-08-28 MED ORDER — LABETALOL HCL 5 MG/ML IV SOLN
INTRAVENOUS | Status: DC | PRN
  Administered 2019-08-28: 2.5 mg via INTRAVENOUS
  Administered 2019-08-28 (×2): 5 mg via INTRAVENOUS

## 2019-08-28 MED ORDER — SEVOFLURANE IN SOLN
RESPIRATORY_TRACT | Status: AC
  Filled 2019-08-28: qty 250

## 2019-08-28 MED ORDER — DEXMEDETOMIDINE HCL 200 MCG/2ML IV SOLN
INTRAVENOUS | Status: DC | PRN
  Administered 2019-08-28 (×2): 4 ug via INTRAVENOUS
  Administered 2019-08-28: 8 ug via INTRAVENOUS
  Administered 2019-08-28: 4 ug via INTRAVENOUS

## 2019-08-28 MED ORDER — OXYCODONE HCL 5 MG PO TABS
ORAL_TABLET | ORAL | Status: AC
  Administered 2019-08-28: 5 mg via ORAL
  Filled 2019-08-28: qty 1

## 2019-08-28 MED ORDER — ACETAMINOPHEN 650 MG RE SUPP: 650.0000 mg | Freq: Four times a day (QID) | RECTAL | Status: DC | PRN

## 2019-08-28 MED ORDER — ONDANSETRON HCL 4 MG/2ML IJ SOLN
INTRAMUSCULAR | Status: DC | PRN
  Administered 2019-08-28 (×2): 4 mg via INTRAVENOUS

## 2019-08-28 MED ORDER — FENTANYL CITRATE (PF) 100 MCG/2ML IJ SOLN
INTRAMUSCULAR | Status: AC
  Administered 2019-08-28: 25 ug via INTRAVENOUS
  Filled 2019-08-28: qty 2

## 2019-08-28 MED ORDER — CEFAZOLIN SODIUM-DEXTROSE 2-4 GM/100ML-% IV SOLN: 2.0000 g | INTRAVENOUS | Status: DC

## 2019-08-28 MED ORDER — OXYCODONE HCL 5 MG PO TABS: 5.0000 mg | ORAL_TABLET | Freq: Once | ORAL | Status: AC | PRN

## 2019-08-28 MED ORDER — PROPOFOL 10 MG/ML IV BOLUS
INTRAVENOUS | Status: DC | PRN
  Administered 2019-08-28: 140 mg via INTRAVENOUS
  Administered 2019-08-28: 60 mg via INTRAVENOUS

## 2019-08-28 MED ORDER — MEPERIDINE HCL 50 MG/ML IJ SOLN: 6.2500 mg | INTRAMUSCULAR | Status: DC | PRN

## 2019-08-28 MED ORDER — CLONIDINE HCL 0.1 MG PO TABS: 0.1000 mg | ORAL_TABLET | Freq: Three times a day (TID) | ORAL | Status: DC

## 2019-08-28 MED ORDER — LABETALOL HCL 5 MG/ML IV SOLN
INTRAVENOUS | Status: AC
  Filled 2019-08-28: qty 4

## 2019-08-28 MED ORDER — LABETALOL HCL 5 MG/ML IV SOLN
10.0000 mg | Freq: Once | INTRAVENOUS | Status: AC
  Administered 2019-08-28: 10 mg via INTRAVENOUS

## 2019-08-28 MED ORDER — ONDANSETRON HCL 4 MG/2ML IJ SOLN
INTRAMUSCULAR | Status: AC
  Filled 2019-08-28: qty 2

## 2019-08-28 MED ORDER — HYDRALAZINE HCL 20 MG/ML IJ SOLN
10.0000 mg | Freq: Once | INTRAMUSCULAR | Status: AC
  Administered 2019-08-28: 10 mg via INTRAVENOUS

## 2019-08-28 MED ORDER — CLONIDINE HCL 0.1 MG PO TABS
0.1000 mg | ORAL_TABLET | Freq: Once | ORAL | Status: AC
  Administered 2019-08-28: 0.1 mg via ORAL
  Filled 2019-08-28: qty 1

## 2019-08-28 MED ORDER — KETOROLAC TROMETHAMINE 30 MG/ML IJ SOLN
30.0000 mg | Freq: Four times a day (QID) | INTRAMUSCULAR | Status: DC
  Administered 2019-08-28 – 2019-08-29 (×3): 30 mg via INTRAVENOUS
  Filled 2019-08-28 (×3): qty 1

## 2019-08-28 MED ORDER — HYDRALAZINE HCL 20 MG/ML IJ SOLN
INTRAMUSCULAR | Status: DC | PRN
  Administered 2019-08-28: 2 mg via INTRAVENOUS

## 2019-08-28 MED ORDER — BUPIVACAINE-EPINEPHRINE 0.25% -1:200000 IJ SOLN
INTRAMUSCULAR | Status: DC | PRN
  Administered 2019-08-28: 30 mL

## 2019-08-28 MED ORDER — SUCCINYLCHOLINE CHLORIDE 200 MG/10ML IV SOSY
PREFILLED_SYRINGE | INTRAVENOUS | Status: AC
  Filled 2019-08-28: qty 10

## 2019-08-28 MED ORDER — OXYCODONE HCL 5 MG/5ML PO SOLN: 5.0000 mg | Freq: Once | ORAL | Status: AC | PRN

## 2019-08-28 MED ORDER — GLYCOPYRROLATE 0.2 MG/ML IJ SOLN
INTRAMUSCULAR | Status: AC
  Filled 2019-08-28: qty 1

## 2019-08-28 MED ORDER — CLONIDINE HCL 0.1 MG PO TABS
0.1000 mg | ORAL_TABLET | Freq: Three times a day (TID) | ORAL | Status: DC
  Administered 2019-08-28 – 2019-08-29 (×2): 0.1 mg via ORAL
  Filled 2019-08-28 (×2): qty 1

## 2019-08-28 MED ORDER — EPHEDRINE SULFATE 50 MG/ML IJ SOLN
INTRAMUSCULAR | Status: DC | PRN
  Administered 2019-08-28: 10 mg via INTRAVENOUS

## 2019-08-28 MED ORDER — CARVEDILOL 6.25 MG PO TABS: 3.1250 mg | ORAL_TABLET | Freq: Two times a day (BID) | ORAL | Status: DC

## 2019-08-28 MED ORDER — FAMOTIDINE 20 MG PO TABS
ORAL_TABLET | ORAL | Status: AC
  Filled 2019-08-28: qty 1

## 2019-08-28 MED ORDER — MIDAZOLAM HCL 2 MG/2ML IJ SOLN
INTRAMUSCULAR | Status: DC | PRN
  Administered 2019-08-28: 2 mg via INTRAVENOUS

## 2019-08-28 MED ORDER — HYDROCODONE-ACETAMINOPHEN 5-325 MG PO TABS
1.0000 | ORAL_TABLET | ORAL | Status: DC | PRN
  Administered 2019-08-29: 2 via ORAL
  Filled 2019-08-28: qty 2

## 2019-08-28 MED ORDER — ROCURONIUM BROMIDE 10 MG/ML (PF) SYRINGE
PREFILLED_SYRINGE | INTRAVENOUS | Status: AC
  Filled 2019-08-28: qty 10

## 2019-08-28 MED ORDER — ALBUTEROL SULFATE HFA 108 (90 BASE) MCG/ACT IN AERS
INHALATION_SPRAY | RESPIRATORY_TRACT | Status: AC
  Filled 2019-08-28: qty 6.7

## 2019-08-28 MED ORDER — ACETAMINOPHEN 325 MG PO TABS
650.0000 mg | ORAL_TABLET | Freq: Four times a day (QID) | ORAL | Status: DC | PRN
  Administered 2019-08-28: 650 mg via ORAL
  Filled 2019-08-28: qty 2

## 2019-08-28 MED ORDER — PANTOPRAZOLE SODIUM 40 MG IV SOLR
40.0000 mg | Freq: Every day | INTRAVENOUS | Status: DC
  Administered 2019-08-28: 40 mg via INTRAVENOUS
  Filled 2019-08-28: qty 40

## 2019-08-28 MED ORDER — PHENYLEPHRINE HCL (PRESSORS) 10 MG/ML IV SOLN
INTRAVENOUS | Status: AC
  Filled 2019-08-28: qty 1

## 2019-08-28 MED ORDER — ACETAMINOPHEN 10 MG/ML IV SOLN
INTRAVENOUS | Status: AC
  Filled 2019-08-28: qty 100

## 2019-08-28 MED ORDER — HYDROCODONE-ACETAMINOPHEN 5-325 MG PO TABS: 1.0000 | ORAL_TABLET | ORAL | 0 refills | Status: DC | PRN

## 2019-08-28 MED ORDER — FAMOTIDINE 20 MG PO TABS
20.0000 mg | ORAL_TABLET | Freq: Once | ORAL | Status: AC
  Administered 2019-08-28: 20 mg via ORAL

## 2019-08-28 MED ORDER — GLYCOPYRROLATE 0.2 MG/ML IJ SOLN
INTRAMUSCULAR | Status: DC | PRN
  Administered 2019-08-28: .2 mg via INTRAVENOUS

## 2019-08-28 MED ORDER — EPINEPHRINE PF 1 MG/ML IJ SOLN
INTRAMUSCULAR | Status: AC
  Filled 2019-08-28: qty 1

## 2019-08-28 MED ORDER — CLONIDINE HCL 0.1 MG PO TABS: 0.1000 mg | ORAL_TABLET | Freq: Four times a day (QID) | ORAL | Status: DC | PRN

## 2019-08-28 MED ORDER — CEFAZOLIN SODIUM-DEXTROSE 2-4 GM/100ML-% IV SOLN
INTRAVENOUS | Status: AC
  Filled 2019-08-28: qty 100

## 2019-08-28 MED ORDER — MORPHINE SULFATE (PF) 4 MG/ML IV SOLN
4.0000 mg | INTRAVENOUS | Status: DC | PRN
  Administered 2019-08-28: 4 mg via INTRAVENOUS
  Filled 2019-08-28: qty 1

## 2019-08-28 MED ORDER — KETOROLAC TROMETHAMINE 30 MG/ML IJ SOLN: 30.0000 mg | Freq: Once | INTRAMUSCULAR | Status: AC

## 2019-08-28 MED ORDER — PROMETHAZINE HCL 25 MG/ML IJ SOLN: 6.2500 mg | INTRAMUSCULAR | Status: DC | PRN

## 2019-08-28 MED ORDER — ALBUTEROL SULFATE HFA 108 (90 BASE) MCG/ACT IN AERS
INHALATION_SPRAY | RESPIRATORY_TRACT | Status: DC | PRN
  Administered 2019-08-28: 4 via RESPIRATORY_TRACT

## 2019-08-28 MED ORDER — CLOPIDOGREL BISULFATE 75 MG PO TABS
75.0000 mg | ORAL_TABLET | Freq: Every day | ORAL | Status: DC
  Administered 2019-08-29: 75 mg via ORAL
  Filled 2019-08-28: qty 1

## 2019-08-28 MED ORDER — SUGAMMADEX SODIUM 500 MG/5ML IV SOLN
INTRAVENOUS | Status: AC
  Filled 2019-08-28: qty 5

## 2019-08-28 MED ORDER — ACETAMINOPHEN 10 MG/ML IV SOLN
INTRAVENOUS | Status: DC | PRN
  Administered 2019-08-28: 1000 mg via INTRAVENOUS

## 2019-08-28 MED ORDER — ASPIRIN EC 81 MG PO TBEC
81.0000 mg | DELAYED_RELEASE_TABLET | Freq: Every day | ORAL | Status: DC
  Administered 2019-08-29: 81 mg via ORAL
  Filled 2019-08-28: qty 1

## 2019-08-28 MED ORDER — LIDOCAINE HCL (PF) 2 % IJ SOLN
INTRAMUSCULAR | Status: AC
  Filled 2019-08-28: qty 5

## 2019-08-28 MED ORDER — ONDANSETRON 4 MG PO TBDP: 4.0000 mg | ORAL_TABLET | Freq: Four times a day (QID) | ORAL | Status: DC | PRN

## 2019-08-28 MED ORDER — LABETALOL HCL 5 MG/ML IV SOLN
5.0000 mg | Freq: Once | INTRAVENOUS | Status: AC
  Administered 2019-08-28: 5 mg via INTRAVENOUS

## 2019-08-28 MED ORDER — LACTATED RINGERS IV SOLN: INTRAVENOUS | Status: DC

## 2019-08-28 MED ORDER — ONDANSETRON HCL 4 MG/2ML IJ SOLN
4.0000 mg | Freq: Four times a day (QID) | INTRAMUSCULAR | Status: DC | PRN
  Administered 2019-08-28: 4 mg via INTRAVENOUS
  Filled 2019-08-28: qty 2

## 2019-08-28 MED ORDER — CARVEDILOL 6.25 MG PO TABS
3.1250 mg | ORAL_TABLET | Freq: Two times a day (BID) | ORAL | Status: DC
  Administered 2019-08-29: 3.125 mg via ORAL
  Filled 2019-08-28: qty 1

## 2019-08-28 MED ORDER — DEXAMETHASONE SODIUM PHOSPHATE 10 MG/ML IJ SOLN
INTRAMUSCULAR | Status: DC | PRN
  Administered 2019-08-28: 10 mg via INTRAVENOUS

## 2019-08-28 MED ORDER — FENTANYL CITRATE (PF) 100 MCG/2ML IJ SOLN
25.0000 ug | INTRAMUSCULAR | Status: DC | PRN
  Administered 2019-08-28 (×3): 25 ug via INTRAVENOUS

## 2019-08-28 MED ORDER — HYDROMORPHONE HCL 1 MG/ML IJ SOLN
1.0000 mg | INTRAMUSCULAR | Status: DC | PRN
  Administered 2019-08-28 – 2019-08-29 (×4): 1 mg via INTRAVENOUS
  Filled 2019-08-28 (×4): qty 1

## 2019-08-28 MED ORDER — KETOROLAC TROMETHAMINE 30 MG/ML IJ SOLN
INTRAMUSCULAR | Status: AC
  Administered 2019-08-28: 30 mg via INTRAVENOUS
  Filled 2019-08-28: qty 1

## 2019-08-28 MED ORDER — BUPIVACAINE HCL (PF) 0.25 % IJ SOLN
INTRAMUSCULAR | Status: AC
  Filled 2019-08-28: qty 30

## 2019-08-28 MED ORDER — AMLODIPINE BESYLATE 5 MG PO TABS
2.5000 mg | ORAL_TABLET | Freq: Every day | ORAL | Status: DC
  Administered 2019-08-28 – 2019-08-29 (×2): 2.5 mg via ORAL
  Filled 2019-08-28 (×2): qty 1

## 2019-08-28 MED ORDER — MIDAZOLAM HCL 2 MG/2ML IJ SOLN
INTRAMUSCULAR | Status: AC
  Filled 2019-08-28: qty 2

## 2019-08-28 MED ORDER — ALPRAZOLAM 0.25 MG PO TABS
0.2500 mg | ORAL_TABLET | Freq: Two times a day (BID) | ORAL | Status: DC | PRN
  Administered 2019-08-29: 0.25 mg via ORAL
  Filled 2019-08-28 (×2): qty 1

## 2019-08-28 MED ORDER — HYDRALAZINE HCL 20 MG/ML IJ SOLN
10.0000 mg | INTRAMUSCULAR | Status: DC | PRN
  Administered 2019-08-29: 10 mg via INTRAVENOUS
  Filled 2019-08-28: qty 1

## 2019-08-28 MED ORDER — ATORVASTATIN CALCIUM 20 MG PO TABS
40.0000 mg | ORAL_TABLET | Freq: Every day | ORAL | Status: DC
  Administered 2019-08-28 – 2019-08-29 (×2): 40 mg via ORAL
  Filled 2019-08-28 (×2): qty 2

## 2019-08-28 SURGICAL SUPPLY — 57 items
ADH SKN CLS APL DERMABOND .7 (GAUZE/BANDAGES/DRESSINGS) ×1
APL PRP FLT STRL LF DISP 70% (MISCELLANEOUS)
APL PRP STRL LF DISP 70% ISPRP (MISCELLANEOUS) ×1
APPLICATOR CHLORAPREP 10 TEAL (MISCELLANEOUS) IMPLANT
BAG INFUSER PRESSURE 100CC (MISCELLANEOUS) IMPLANT
BLADE SURG SZ11 CARB STEEL (BLADE) ×3 IMPLANT
CANISTER SUCT 1200ML W/VALVE (MISCELLANEOUS) ×3 IMPLANT
CHLORAPREP W/TINT 26 (MISCELLANEOUS) ×3 IMPLANT
COVER TIP SHEARS 8 DVNC (MISCELLANEOUS) ×1 IMPLANT
COVER TIP SHEARS 8MM DA VINCI (MISCELLANEOUS) ×3
COVER WAND RF STERILE (DRAPES) ×6 IMPLANT
DEFOGGER SCOPE WARMER CLEARIFY (MISCELLANEOUS) ×3 IMPLANT
DERMABOND ADVANCED (GAUZE/BANDAGES/DRESSINGS) ×2
DERMABOND ADVANCED .7 DNX12 (GAUZE/BANDAGES/DRESSINGS) ×1 IMPLANT
DRAPE 3/4 80X56 (DRAPES) ×1 IMPLANT
DRAPE ARM DVNC X/XI (DISPOSABLE) ×3 IMPLANT
DRAPE COLUMN DVNC XI (DISPOSABLE) ×1 IMPLANT
DRAPE DA VINCI XI ARM (DISPOSABLE) ×9
DRAPE DA VINCI XI COLUMN (DISPOSABLE) ×3
ELECT REM PT RETURN 9FT ADLT (ELECTROSURGICAL) ×3
ELECTRODE REM PT RTRN 9FT ADLT (ELECTROSURGICAL) ×1 IMPLANT
GLOVE BIO SURGEON STRL SZ 6.5 (GLOVE) ×6 IMPLANT
GLOVE BIO SURGEONS STRL SZ 6.5 (GLOVE) ×4
GLOVE BIOGEL PI IND STRL 6.5 (GLOVE) ×2 IMPLANT
GLOVE BIOGEL PI INDICATOR 6.5 (GLOVE) ×8
GOWN STRL REUS W/ TWL LRG LVL3 (GOWN DISPOSABLE) ×3 IMPLANT
GOWN STRL REUS W/TWL LRG LVL3 (GOWN DISPOSABLE) ×12
IRRIGATOR SUCT 8 DISP DVNC XI (IRRIGATION / IRRIGATOR) IMPLANT
IRRIGATOR SUCTION 8MM XI DISP (IRRIGATION / IRRIGATOR)
IV CATH ANGIO 12GX3 LT BLUE (NEEDLE) ×2 IMPLANT
IV NS 1000ML (IV SOLUTION)
IV NS 1000ML BAXH (IV SOLUTION) IMPLANT
KIT PINK PAD W/HEAD ARE REST (MISCELLANEOUS) ×3
KIT PINK PAD W/HEAD ARM REST (MISCELLANEOUS) ×1 IMPLANT
LABEL OR SOLS (LABEL) IMPLANT
MESH 3DMAX 4X6 LT LRG (Mesh General) ×2 IMPLANT
MESH 3DMAX 4X6 RT LRG (Mesh General) ×2 IMPLANT
MESH 3DMAX MID 4X6 LT LRG (Mesh General) IMPLANT
MESH 3DMAX MID 4X6 RT LRG (Mesh General) IMPLANT
NDL INSUFFLATION 14GA 120MM (NEEDLE) ×1 IMPLANT
NEEDLE HYPO 22GX1.5 SAFETY (NEEDLE) ×3 IMPLANT
NEEDLE INSUFFLATION 14GA 120MM (NEEDLE) ×3 IMPLANT
OBTURATOR OPTICAL STANDARD 8MM (TROCAR) ×3
OBTURATOR OPTICAL STND 8 DVNC (TROCAR) ×1
OBTURATOR OPTICALSTD 8 DVNC (TROCAR) ×1 IMPLANT
PACK LAP CHOLECYSTECTOMY (MISCELLANEOUS) ×3 IMPLANT
SCISSORS METZENBAUM CVD 33 (INSTRUMENTS) ×2 IMPLANT
SEAL CANN UNIV 5-8 DVNC XI (MISCELLANEOUS) ×3 IMPLANT
SEAL XI 5MM-8MM UNIVERSAL (MISCELLANEOUS) ×9
SET TUBE SMOKE EVAC HIGH FLOW (TUBING) ×3 IMPLANT
SOLUTION ELECTROLUBE (MISCELLANEOUS) ×3 IMPLANT
SUT MNCRL AB 4-0 PS2 18 (SUTURE) ×3 IMPLANT
SUT VIC AB 2-0 SH 27 (SUTURE) ×3
SUT VIC AB 2-0 SH 27XBRD (SUTURE) ×1 IMPLANT
SUT VLOC 90 S/L VL9 GS22 (SUTURE) ×5 IMPLANT
TAPE TRANSPORE STRL 2 31045 (GAUZE/BANDAGES/DRESSINGS) ×2 IMPLANT
TRAY FOLEY MTR SLVR 16FR STAT (SET/KITS/TRAYS/PACK) ×3 IMPLANT

## 2019-08-28 NOTE — Transfer of Care (Signed)
Immediate Anesthesia Transfer of Care Note  Patient: Theodore Obrien  Procedure(s) Performed: XI ROBOTIC ASSISTED INGUINAL HERNIA REPAIR WITH MESH (Bilateral Groin)  Patient Location: PACU  Anesthesia Type:General  Level of Consciousness: sedated  Airway & Oxygen Therapy: Patient Spontanous Breathing and Patient connected to face mask oxygen  Post-op Assessment: Report given to RN and Post -op Vital signs reviewed and stable  Post vital signs: Reviewed and stable  Last Vitals:  Vitals Value Taken Time  BP 224/107 08/28/19 1239  Temp 37.1 C 08/28/19 1240  Pulse 79 08/28/19 1244  Resp 17 08/28/19 1244  SpO2 98 % 08/28/19 1244  Vitals shown include unvalidated device data.  Last Pain:  Vitals:   08/28/19 1240  TempSrc:   PainSc: 0-No pain         Complications: No apparent anesthesia complications

## 2019-08-28 NOTE — Progress Notes (Signed)
Patient has distributed High Bp since arrival to PACU. I have given 5 Labetalol @ 1301,10 Hydralazine at 1329, 10 Labetalol @ 1522, 10 Labetalol @ 1708, and 10 Hydralazine @ 1732. Patients Bp has been up and down for hours and he has been tachy in the 100s. Dr. Lubertha Basque stated that patient can go to room when his diastolic is below [id number] and it is 176/95 so I will be taking patient to his room. Hospitalist has been paged and he will go to room 229 to see him. Dr. Windell Moment is aware of the patients blood pressure issues.

## 2019-08-28 NOTE — Anesthesia Preprocedure Evaluation (Addendum)
Anesthesia Evaluation  Patient identified by MRN, date of birth, ID band Patient awake    Reviewed: Allergy & Precautions, NPO status , Patient's Chart, lab work & pertinent test results  History of Anesthesia Complications Negative for: history of anesthetic complications  Airway Mallampati: II  TM Distance: >3 FB Neck ROM: Full    Dental  (+) Poor Dentition   Pulmonary neg sleep apnea, neg COPD, former smoker,    breath sounds clear to auscultation- rhonchi (-) wheezing      Cardiovascular Exercise Tolerance: Good hypertension, Pt. on medications + CAD, + Past MI and + Cardiac Stents (8/20)  (-) CABG  Rhythm:Regular Rate:Normal - Systolic murmurs and - Diastolic murmurs Echo 123456: NORMAL LEFT VENTRICULAR SYSTOLIC FUNCTION WITH AN ESTIMATED EF = 50-55 % NORMAL RIGHT VENTRICULAR SYSTOLIC FUNCTION MILD-TO-MODERATE TRICUSPID AND MITRAL VALVE INSUFFICIENCY NO VALVULAR STENOSIS MILD RV ENLARGEMENT MILD BIATRIAL ENLARGEMENT  NM stress test 08/05/19: No evidence of ischemia    Neuro/Psych neg Seizures PSYCHIATRIC DISORDERS Anxiety negative neurological ROS     GI/Hepatic Neg liver ROS, GERD  ,  Endo/Other  negative endocrine ROSneg diabetes  Renal/GU negative Renal ROS     Musculoskeletal negative musculoskeletal ROS (+)   Abdominal (+) - obese,   Peds  Hematology negative hematology ROS (+)   Anesthesia Other Findings Past Medical History: No date: Dyspnea No date: GERD (gastroesophageal reflux disease) No date: History of kidney stones No date: Hypertension 2020: Myocardial infarction ([place])   Reproductive/Obstetrics                            Anesthesia Physical Anesthesia Plan  ASA: III  Anesthesia Plan: General   Post-op Pain Management:    Induction: Intravenous  PONV Risk Score and Plan: 1 and Ondansetron, Dexamethasone and Midazolam  Airway Management Planned: Oral  ETT  Additional Equipment:   Intra-op Plan:   Post-operative Plan: Extubation in OR  Informed Consent: I have reviewed the patients History and Physical, chart, labs and discussed the procedure including the risks, benefits and alternatives for the proposed anesthesia with the patient or authorized representative who has indicated his/her understanding and acceptance.     Dental advisory given  Plan Discussed with: CRNA and Anesthesiologist  Anesthesia Plan Comments:         Anesthesia Quick Evaluation

## 2019-08-28 NOTE — Discharge Instructions (Signed)
AMBULATORY SURGERY  DISCHARGE INSTRUCTIONS   1) The drugs that you were given will stay in your system until tomorrow so for the next 24 hours you should not:  A) Drive an automobile B) Make any legal decisions C) Drink any alcoholic beverage   2) You may resume regular meals tomorrow.  Today it is better to start with liquids and gradually work up to solid foods.  You may eat anything you prefer, but it is better to start with liquids, then soup and crackers, and gradually work up to solid foods.   3) Please notify your doctor immediately if you have any unusual bleeding, trouble breathing, redness and pain at the surgery site, drainage, fever, or pain not relieved by medication.    4) Additional Instructions:        Please contact your physician with any problems or Same Day Surgery at [phone], Monday through Friday 6 am to 4 pm, or [place] [facility] number at [phone]. Diet: Resume home heart healthy regular diet.   Activity: No heavy lifting >20 pounds (children, pets, laundry, garbage) or strenuous activity until follow-up, but light activity and walking are encouraged. Do not drive or drink alcohol if taking narcotic pain medications.  Wound care: May shower with soapy water and pat dry (do not rub incisions), but no baths or submerging incision underwater until follow-up. (no swimming)   Medications: Resume all home medications. For mild to moderate pain: acetaminophen (Tylenol) or ibuprofen (if no kidney disease). Combining Tylenol with alcohol can substantially increase your risk of causing liver disease. Narcotic pain medications, if prescribed, can be used for severe pain, though may cause nausea, constipation, and drowsiness. Do not combine Tylenol and Norco within a 6 hour period as Norco contains Tylenol. If you do not need the narcotic pain medication, you do not need to fill the prescription.  Call office ([phone]) at any time if any  questions, worsening pain, fevers/chills, bleeding, drainage from incision site, or other concerns.  

## 2019-08-28 NOTE — Interval H&P Note (Signed)
History and Physical Interval Note:  08/28/2019 6:59 AM  Theodore Obrien  has presented today for surgery, with the diagnosis of K40.90 non recurrent unilateral inguinal hernia w/o obstruction or gangrene.  The various methods of treatment have been discussed with the patient and family. After consideration of risks, benefits and other options for treatment, the patient has consented to  Procedure(s): XI ROBOTIC [place] (Bilateral) as a surgical intervention.  The patient's history has been reviewed, patient examined, no change in status, stable for surgery.  I have reviewed the patient's chart and labs.  Right groin marked in the pre procedure room as the side of most symptoms but most likely a bilateral repair will be needed. Questions were answered to the patient's satisfaction.     Herbert Pun

## 2019-08-28 NOTE — Brief Op Note (Signed)
08/28/2019  3:37 PM  PATIENT:  Theodore Obrien  69 y.o. male  PRE-OPERATIVE DIAGNOSIS:  K40.90 non recurrent right unilateral inguinal hernia w/o obstruction or gangrene Recurrent left inguinal hernia  POST-OPERATIVE DIAGNOSIS:  K40.90 non recurrent right unilateral inguinal hernia w/o obstruction  Recurrent left inguinal hernia  Hypertensive urgency  PROCEDURE:  Procedure(s): XI ROBOTIC ASSISTED INGUINAL HERNIA REPAIR WITH MESH (Bilateral)  SURGEON:  Surgeon(s) and Role:    * Herbert Pun, MD - Primary  ANESTHESIA:   local and general  EBL:  20 mL   PLAN OF CARE: Admit for overnight observation  PATIENT DISPOSITION: We will consult hospitalist for evaluation of persistent elevated blood pressure.  Last blood pressure 250/153 after IV labetalol and hydralazine.  Patient also with O2 sats up to 90 in 4 L/min of oxygen supplement with nasal cannula.  After discussion with anesthesia patient able to be discharged as planned.  Will follow hospitalist recommendations.  We will continue pain management.  Patient may resume regular diet.  Will restart home meds.

## 2019-08-28 NOTE — Op Note (Addendum)
Preoperative diagnosis: Right inguinal hernia.                                              Left recurrent inguinal hernia  Postoperative diagnosis: Right inguinal hernia.                                              Left recurrent inguinal hernia  Procedure: Robotic assisted Laparoscopic Transabdominal preperitoneal laparoscopic (TAPP) repair of bilateral inguinal hernia. (Left recurrent)  Anesthesia: GETA  Surgeon: Dr. Windell Moment  Wound Classification: Clean  Indications:  Patient is a 69 y.o. male developed a symptomatic bilateral inguinal hernia. Repair was indicated.  Findings: 1. Large right indirect Inguinal hernia identified 2. Left femoral hernia and cord lipoma identified 3. Vas deferens and cord structures identified and preserved 4. Bard 3D Max mesh used for repair 5. Adequate hemostasis.   Description of procedure: The patient was taken to the operating room and the correct side of surgery was verified. The patient was placed supine with arms tucked at the sides. After obtaining adequate anesthesia, the patient's abdomen was prepped and draped in standard sterile fashion. The patient was placed in the Trendelenburg position. A time-out was completed verifying correct patient, procedure, site, positioning, and implant(s) and/or special equipment prior to beginning this procedure. A Veress needle was placed at the umbilicus and pneumoperitoneum created with insufflation of carbon dioxide to 15 mmHg. After the Veress needle was removed, an 8-mm trocar was placed on epigastric area and the 30 angled laparoscope inserted. Two 8-mm trocars were then placed lateral to the rectus sheath under direct visualization. Both inguinal regions were inspected and the median umbilical ligament, medial umbilical ligament, and lateral umbilical fold were identified.  The robotic arms were docked. The robotic scope was inserted and the pelvic area anatomy targeted.  The peritoneum was incised with  scissors along a line 5 cm above the superior edge of the hernia defect, extending from the median umbilical ligament to the anterior superior iliac spine. The peritoneal flap was mobilized inferiorly using blunt and sharp dissection. The inferior epigastric vessels were exposed and the pubic symphysis was identified. Cooper's ligament was dissected to its junction with the iliac vein. The dissection was continued inferiorly to the iliopubic tract, with care taken to avoid injury to the femoral branch of the genitofemoral nerve and the lateral femoral cutaneous nerve. The cord structures were parietalized. The hernia was identified and reduced by gentle traction.  The indirect hernia sac and cord lipoma were noted mobilized from the cord structures and reduced into the peritoneal cavity.  A large piece of mesh was rolled longitudinally into a compact cylinder and passed through a trocar. The cylinder was placed along the inferior aspect of the working space and unrolled into place to completely cover the direct, indirect, and femoral spaces. The mesh was secured into place superiorly to the anterior abdominal wall and inferiorly and medially to Cooper's ligament with absorbable sutures. Care was taken to avoid the inferolateral triangles containing the iliac vessels and genital nerves. The peritoneal flap was closed over the mesh and secured with suture in similar positions of safety.  The left groin was explored and a cord lipoma was identified. A very time consuming  and difficult dissection around the old mesh was needed to be done. Ultimately, the old mesh needed to be resected to be able to place a new mesh in the right position. A small fat containing fat femoral hernia was also identified and reduced. A large piece of mesh was also placed on the left groin the same way as on the right groin. The peritoneal flap was closed. Pre peritoneal gas was evacuated. The old mesh was taken out in pieces.  After  ensuring adequate hemostasis, the trocars were removed and the pneumoperitoneum allowed to escape. The trocar incisions were closed using monocryl and skin adhesive dressings applied.  The patient tolerated the procedure well and was taken to the postanesthesia care unit in stable condition.   Specimen: left groin mesh  Complications: None  Estimated Blood Loss: 25 mL  Right inguinal hernia:    Left recurrent inguinal hernia:    Hernias repaired:   Left:    Right:

## 2019-08-28 NOTE — Consult Note (Signed)
Medical Consult  Theodore Obrien [id number] DOB: February 03, 1951 DOA: 08/28/2019  PCP: Tracie Harrier, MD  Requesting Physician: Dr. Annamary Rummage Reason for consultation: uncontrolled HTN  Chief Complaint: headache  HPI:  69 year old man PMH essential hypertension, NSTEMI, CAD status post stent placement, COPD, emphysema, presented today for elective symptomatic bilateral inguinal hernia repair.  Procedure reportedly without complication and blood pressure was controlled preoperatively.  Postoperatively blood pressure uncontrolled.  Hospitalist service asked to assess.  Patient reports blood pressure is usually quite well controlled with systolic less than 123456.  He has had no recent chest pain.  He uses his inhaler every morning but otherwise has no limitations in regard to his breathing as far as he is concerned.  He took both carvedilol in a.m. clonidine this morning.  He did not take amlodipine.  He missed his afternoon dose of clonidine because of surgery.  Chart review: . Blood pressures reviewed from several office notes from January, March and April of this year: 104/60 110/70 182/84 140/80  Review of Systems:  Negative for fever, visual changes, sore throat, rash, new muscle aches, chest pain, SOB, dysuria, bleeding  Positive for nausea  Past Medical History:  Diagnosis Date  . Dyspnea   . GERD (gastroesophageal reflux disease)   . History of kidney stones   . Hypertension   . Myocardial infarction [facility]) 2020    Past Surgical History:  Procedure Laterality Date  . CARPAL TUNNEL RELEASE Left   . CORONARY STENT INTERVENTION N/A 12/31/2018   Procedure: CORONARY STENT INTERVENTION;  Surgeon: Yolonda Kida, MD;  Location: [place] [facility];  Service: Cardiovascular;  Laterality: N/A;  . FEMORAL HERNIA REPAIR Left 10/27/2014   Procedure: Repair of left femoral and direct hernia with mesh ;  Surgeon: Sherri Rad, MD;  Location: [facility] ORS;  Service: General;  Laterality: Left;    . FRACTURE SURGERY     right leg  . LEFT HEART CATH AND CORONARY ANGIOGRAPHY N/A 12/31/2018   Procedure: LEFT HEART CATH AND CORONARY ANGIOGRAPHY;  Surgeon: Corey Skains, MD;  Location: [place] [facility] LAB;  Service: Cardiovascular;  Laterality: N/A;     reports that he quit smoking about 3 years ago. His smoking use included cigarettes. He has a 6.00 pack-year smoking history. He has never used smokeless tobacco. He reports current alcohol use. He reports that he does not use drugs. Mobility: Ambulatory  Allergies  Allergen Reactions  . Zyvox [Linezolid] Other (See Comments)    Nerve damage (neuropathy) Retinal hemmorhage    Family History  Problem Relation Age of Onset  . Hypertension Mother   . Heart disease Mother   . Prostate cancer Father   . Leukemia Father      Prior to Admission medications   Medication Sig Start Date End Date Taking? Authorizing Provider  albuterol (PROVENTIL HFA) 108 (90 Base) MCG/ACT inhaler Inhale into the lungs. 08/03/17 08/28/19 Yes [provider]  ALPRAZolam Duanne Moron) 0.25 MG tablet Take 0.25 mg by mouth 2 (two) times daily as needed for anxiety or sleep.  08/03/17  Yes [provider]  amLODipine (NORVASC) 2.5 MG tablet Take 2.5 mg by mouth daily.   Yes [provider]  aspirin EC 81 MG tablet Take 81 mg by mouth daily.   Yes [provider]  atorvastatin (LIPITOR) 40 MG tablet Take 1 tablet (40 mg total) by mouth daily. 01/01/19 08/22/19 Yes Ojie, Jude, MD  carvedilol (COREG) 3.125 MG tablet Take 1 tablet (3.125 mg total) by mouth  2 (two) times daily with a meal. Hold for heart rate less than 60. 01/01/19  Yes Ojie, Jude, MD  cloNIDine (CATAPRES) 0.1 MG tablet Take 0.1 mg by mouth 3 (three) times daily.  06/19/17  Yes [provider]  aspirin EC 81 MG EC tablet Take 1 tablet (81 mg total) by mouth daily. Patient not taking: Reported on 08/20/2019 01/02/19   Otila Back, MD  clopidogrel (PLAVIX) 75 MG tablet Take  75 mg by mouth daily.    [provider]  HYDROcodone-acetaminophen (NORCO) 5-325 MG tablet Take 1 tablet by mouth every 4 (four) hours as needed for up to 3 days for moderate pain. 08/28/19 08/31/19  Herbert Pun, MD    Physical Exam: Vitals:   08/28/19 1823 08/28/19 1859  BP: (!) 197/106 (!) 196/101  Pulse: 71 71  Resp: 12 18  Temp: 98.5 F (36.9 C) 98.8 F (37.1 C)  SpO2: 97% 95%    Constitutional:   . Appears to be in pain, uncomfortable, nontoxic Eyes:  Marland Kitchen Eyes appear grossly normal ENMT:  . grossly normal hearing  . Lips appear grossly normal Neck:  . Appears grossly normal Respiratory:  . CTA bilaterally, no w/r/r.  . Respiratory effort normal.  Cardiovascular:  . RRR, no m/r/g . No LE extremity edema   . Radial pulses 2+ bilaterally Abdomen:  . Not examined secondary to pain and recent surgery Musculoskeletal:  . RUE, LUE, RLE, LLE   . tone appears grossly normal Skin:  . No rashes, lesions, ulcers . palpation of skin: no induration or nodules Neurologic:  . Grossly unremarkable Psychiatric:  . Mental status o Mood, affect appropriate . judgment and insight appear intact   I have personally reviewed following labs and imaging studies  Labs:  SARS-CoV-2 negative  Imaging studies:   Chest x-ray independently reviewed shows mild hyperinflation  Medical tests:   EKG pending  Principal Problem:   Hypertensive urgency Active Problems:   Benign essential HTN   CAD (coronary artery disease)   Impression/Recommendations Hypertensive urgency, asymptomatic other than headache.  No chest pain, shortness of breath, visual disturbance or confusion. --Suspect multifactorial including rebound hypertension from missed midday clonidine dose, missed amlodipine, significant postsurgical pain --We will go ahead and give extra clonidine now, carvedilol now and hydralazine as needed.  Will treat pain aggressively, as well as nausea.  Discussed with  Dr. Windell Moment, okay to switch to Dilaudid.   --will check EKG, BMP and CBC --If blood pressure does not correct or symptoms develop, will proceed more aggressively, however he has no signs or symptoms of emergency at this point and I suspect his blood pressure will decrease with resumption of his medications and treatment of pain.  Essential hypertension --As above, uncontrolled, continue carvedilol, amlodipine and clonidine  Subclinical COPD suspected. --Patient denies the diagnosis but he does use an albuterol inhaler and is a former smoker. --Chest x-ray consistent with mild COPD, no acute infiltrate.  Coronary artery disease, NSTEMI, status post stent LAD --Asymptomatic.  Resume aspirin Plavix when okay with surgery  I discussed recommendations with Dr. Windell Moment  Time spent: 50 minutes  Murray Hodgkins, MD  [facility] Direct contact: see [email or web address]  7PM-7AM contact night coverage as below   1. Check the care team in [facility] and look for a) attending/consulting [facility] provider listed and b) the [facility] team listed 2. Log into [email or web address] and use [facility] universal password to access. If you do not have the password, please contact the hospital operator.  3. Locate the [facility] provider you are looking for under [facility] and page to a number that you can be directly reached. 4. If you still have difficulty reaching the provider, please page the [facility] (Director on Call) for the Hospitalists listed on amion for assistance.   08/28/2019, 7:43 PM

## 2019-08-28 NOTE — Anesthesia Procedure Notes (Signed)
Procedure Name: Intubation Performed by: Jonna Clark, CRNA Pre-anesthesia Checklist: Patient identified, Patient being monitored, Timeout performed, Emergency Drugs available and Suction available Patient Re-evaluated:Patient Re-evaluated prior to induction Oxygen Delivery Method: Circle system utilized Preoxygenation: Pre-oxygenation with 100% oxygen Induction Type: IV induction Ventilation: Mask ventilation without difficulty Laryngoscope Size: McGraph and 4 Grade View: Grade I Tube type: Oral Tube size: 7.5 mm Number of attempts: 1 Airway Equipment and Method: Stylet Placement Confirmation: ETT inserted through vocal cords under direct vision,  positive ETCO2 and breath sounds checked- equal and bilateral Secured at: 23 cm Tube secured with: Tape Dental Injury: Teeth and Oropharynx as per pre-operative assessment

## 2019-08-28 NOTE — Progress Notes (Signed)
Patient BP on arrival to PACU is 224/107 therefore CRNA gave 5 labetalol per Dr. Randa Lynn. I will continue to monitor patient.

## 2019-08-29 DIAGNOSIS — K4090 Unilateral inguinal hernia, without obstruction or gangrene, not specified as recurrent: Secondary | ICD-10-CM | POA: Diagnosis not present

## 2019-08-29 DIAGNOSIS — I10 Essential (primary) hypertension: Secondary | ICD-10-CM | POA: Diagnosis not present

## 2019-08-29 DIAGNOSIS — I160 Hypertensive urgency: Secondary | ICD-10-CM | POA: Diagnosis not present

## 2019-08-29 LAB — SURGICAL PATHOLOGY

## 2019-08-29 MED ORDER — TAMSULOSIN HCL 0.4 MG PO CAPS
0.4000 mg | ORAL_CAPSULE | Freq: Every day | ORAL | Status: DC
  Administered 2019-08-29: 0.4 mg via ORAL
  Filled 2019-08-29: qty 1

## 2019-08-29 MED ORDER — OXYCODONE-ACETAMINOPHEN 7.5-325 MG PO TABS
1.0000 | ORAL_TABLET | Freq: Four times a day (QID) | ORAL | Status: DC | PRN
  Administered 2019-08-29: 1 via ORAL
  Filled 2019-08-29 (×2): qty 1

## 2019-08-29 MED ORDER — OXYCODONE-ACETAMINOPHEN 7.5-325 MG PO TABS: 1.0000 | ORAL_TABLET | ORAL | 0 refills | Status: DC | PRN

## 2019-08-29 NOTE — Care Management Obs Status (Signed)
[place] NOTIFICATION   Patient Details  Name: Keyonne Forsha MRN: [id number] Date of Birth: 1951/02/18   Medicare Observation Status Notification Given:  No(admitted obs less thand 24 hours)    Beverly Sessions, RN 08/29/2019, 2:54 PM

## 2019-08-29 NOTE — Progress Notes (Signed)
[place] Hospital Day(s): 0.   Post op day(s): 1 Day Post-Op.   Interval History: Patient seen and examined, no acute events or new complaints overnight. Patient reports having significant pain in the groin area.  He is also complained about difficulty with urination.  Bladder scan done by nurse showing 250 mL in the bladder.  Patient voided 30 mL in the last hour.  Reports tolerating diet.  Denies nausea or vomiting.  Vital signs in last 24 hours: [min-max] current  Temp:  [98.1 F (36.7 C)-98.8 F (37.1 C)] 98.7 F (37.1 C) (04/29 0813) Pulse Rate:  [53-116] 100 (04/29 0813) Resp:  [9-28] 12 (04/29 0813) BP: (132-228)/(64-146) 181/96 (04/29 0813) SpO2:  [89 %-99 %] 92 % (04/29 0813)     Height: 6\' 1"  (185.4 cm) Weight: 81 kg BMI (Calculated): 23.56   Physical Exam:  Constitutional: alert, cooperative and no distress  Respiratory: breathing non-labored at rest  Cardiovascular: regular rate and sinus rhythm  Gastrointestinal: soft, tender in the groin and suprapubic area, and non-distended.  Supraumbilical wound with bruise.  Labs:  CBC Latest Ref Rng & Units 08/28/2019 01/01/2019 12/28/2018  WBC 4.0 - 10.5 K/uL 9.0 13.4(H) 10.4  Hemoglobin 13.0 - 17.0 g/dL 14.8 15.1 15.7  Hematocrit 39.0 - 52.0 % 45.7 46.6 47.5  Platelets 150 - 400 K/uL 166 201 153   CMP Latest Ref Rng & Units 08/28/2019 01/01/2019 12/31/2018  Glucose 70 - 99 mg/dL 162(H) 169(H) 147(H)  BUN 8 - 23 mg/dL 23 52(H) 29(H)  Creatinine 0.61 - 1.24 mg/dL 1.23 1.70(H) 0.87  Sodium 135 - 145 mmol/L 137 132(L) 135  Potassium 3.5 - 5.1 mmol/L 5.4(H) 4.2 4.3  Chloride 98 - 111 mmol/L 99 94(L) 98  CO2 22 - 32 mmol/L 28 26 28   Calcium 8.9 - 10.3 mg/dL 9.2 8.1(L) 8.8(L)  Total Protein 6.5 - 8.1 g/dL - - -  Total Bilirubin 0.3 - 1.2 mg/dL - - -  Alkaline Phos 38 - 126 U/L - - -  AST 15 - 41 U/L - - -  ALT 0 - 44 U/L - - -    Imaging studies: No new pertinent imaging studies   Assessment/Plan:  69 y.o.  male with bilateral inguinal hernia 1 Day Post-Op s/p robotic assisted laparoscopic bilateral inguinal hernia repair, complicated by pertinent comorbidities including uncontrolled hypertension and difficulty with urination.  Patient today with improvement of the blood pressure and O2 sat but now with difficulty voiding.  I see no difficulty voiding is due to the pain.  I optimized the pain medication adding Toradol and change Norco to Percocet.  Yesterday hospitalist optimize the pain medication from morphine to Dilaudid.  I will also add Flomax to see if this help the patient improve his urination.  If the pain cannot be controlled and the patient continue with his follow urination patient will not be able to be discharged today as planned.  We will also continue with current blood pressure medication as recommended by hospitalist.  Will follow closely with serial abdominal exams today.  Arnold Long, MD

## 2019-08-29 NOTE — Discharge Summary (Signed)
  Patient ID: Theodore Obrien MRN: [id number] DOB/AGE: 10-01-1950 69 y.o.  Admit date: 08/28/2019 Discharge date: 08/29/2019   Discharge Diagnoses:  Principal Problem:   Hypertensive urgency Active Problems:   Benign essential HTN   CAD (coronary artery disease)   Procedures: Robotic assisted laparoscopic bilateral inguinal hernia   Hospital Course: Patient had above procedure. At recovery the blood pressure was not able to be controlled lower than systolic of [id number]. O2 sat also not able to be improved over 90% without oxygen supplement. Hospitalist consulted for assistance in blood pressure control. Today patient with improved blood pressure. Tolerating diet. Passing gas. Voiding spontaneously. Ambulating. Pain better controlled. Will discharge patient today.   Physical Exam  Constitutional: He is oriented to person, place, and time and well-developed, well-nourished, and in no distress.  Cardiovascular: Normal rate.  Pulmonary/Chest: Effort normal.  Abdominal: Soft. He exhibits no distension. There is no rebound and no guarding.  Musculoskeletal:     Cervical back: Normal range of motion.  Neurological: He is alert and oriented to person, place, and time.  Large bruise around umbilical wound.    Consults: Hospitalist.   Disposition: Discharge disposition: 01-Home or Self Care       Discharge Instructions    Diet - low sodium heart healthy   Complete by: As directed    Diet - low sodium heart healthy   Complete by: As directed    Diet - low sodium heart healthy   Complete by: As directed    Increase activity slowly   Complete by: As directed      Allergies as of 08/29/2019      Reactions   Zyvox [linezolid] Other (See Comments)   Nerve damage (neuropathy) Retinal hemmorhage      Medication List    TAKE these medications   ALPRAZolam 0.25 MG tablet Commonly known as: XANAX Take 0.25 mg by mouth 2 (two) times daily as needed for anxiety or sleep.   amLODipine  2.5 MG tablet Commonly known as: NORVASC Take 2.5 mg by mouth daily.   aspirin EC 81 MG tablet Take 81 mg by mouth daily.   aspirin 81 MG EC tablet Take 1 tablet (81 mg total) by mouth daily.   atorvastatin 40 MG tablet Commonly known as: Lipitor Take 1 tablet (40 mg total) by mouth daily.   carvedilol 3.125 MG tablet Commonly known as: COREG Take 1 tablet (3.125 mg total) by mouth 2 (two) times daily with a meal. Hold for heart rate less than 60.   cloNIDine 0.1 MG tablet Commonly known as: CATAPRES Take 0.1 mg by mouth 3 (three) times daily.   clopidogrel 75 MG tablet Commonly known as: PLAVIX Take 75 mg by mouth daily.   oxyCODONE-acetaminophen 7.5-325 MG tablet Commonly known as: Percocet Take 1 tablet by mouth every 4 (four) hours as needed for severe pain.   Proventil HFA 108 (90 Base) MCG/ACT inhaler Generic drug: albuterol Inhale into the lungs.      Follow-up Information    Herbert Pun, MD Follow up on 09/12/2019.   Specialty: General Surgery Why: 9am Contact information: [place] [address] [phone]

## 2019-08-29 NOTE — Progress Notes (Signed)
  PROGRESS NOTE  Zaith Hurd [id number] DOB: 16-Jul-1950 DOA: 08/28/2019 PCP: Tracie Harrier, MD  Brief History   69 year old man PMH essential hypertension, NSTEMI, CAD status post stent placement, COPD, emphysema, presented today for elective symptomatic bilateral inguinal hernia repair.  Procedure reportedly without complication and blood pressure was controlled preoperatively.  Postoperatively blood pressure uncontrolled.  Hospitalist service asked to assess.  A & P  Hypertension urgency, now resolved.  Multifactorial including rebound hypertension from missed blood pressure medications as well as significant postsurgical pain. --Better today, headache resolved.  Recommend continuing current medications.  Essential hypertension --Plan as above  Appears stable, agree with discharge.  Will sign off.  Murray Hodgkins, MD  [facility] Direct contact: see [email or web address] (further directions at bottom of note if needed) 7PM-7AM contact night coverage as at bottom of note 08/29/2019, 6:09 PM  LOS: 0 days    Interval History/Subjective  Headache has resolved.  Still has lower abdominal soreness.  Objective   Vitals:  Vitals:   08/29/19 1200 08/29/19 1353  BP:  (!) 177/95  Pulse:  (!) 105  Resp:  18  Temp:  98.2 F (36.8 C)  SpO2: 96% 90%    Exam:  Constitutional.  Appears calm, comfortable. Respiratory.  Clear to auscultation bilaterally.  No wheezes, rales or rhonchi.  Normal respiratory effort. Cardiovascular.  Regular rate and rhythm.  No murmur, rub or gallop. Psychiatric.  Grossly normal mood and affect.  Speech fluent and appropriate.  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  CBC yesterday was unremarkable.  BMP was unremarkable with trivial elevation of potassium.  Scheduled Meds: . albuterol  2.5 mg Inhalation QID  . amLODipine  2.5 mg Oral Daily  . aspirin EC  81 mg Oral Daily  . atorvastatin  40 mg Oral Daily  . carvedilol   3.125 mg Oral BID WC  . cloNIDine  0.1 mg Oral TID  . clopidogrel  75 mg Oral Daily  . enoxaparin (LOVENOX) injection  40 mg Subcutaneous Q24H  . ketorolac  30 mg Intravenous Q6H  . pantoprazole (PROTONIX) IV  40 mg Intravenous QHS  . tamsulosin  0.4 mg Oral Daily   Continuous Infusions:  Principal Problem:   Hypertensive urgency Active Problems:   Benign essential HTN   CAD (coronary artery disease)   LOS: 0 days   How to contact the [facility] Attending or Consulting provider [place] or covering provider during after hours [place], for this patient?  1. Check the care team in [facility] and look for a) attending/consulting [facility] provider listed and b) the [facility] team listed 2. Log into [email or web address] and use [place]'s universal password to access. If you do not have the password, please contact the hospital operator. 3. Locate the [facility] provider you are looking for under [facility] and page to a number that you can be directly reached. 4. If you still have difficulty reaching the provider, please page the [facility] (Director on Call) for the Hospitalists listed on amion for assistance.

## 2019-08-29 NOTE — Progress Notes (Signed)
Theodore Obrien A and O x4. VSS. Pt tolerating diet well. No complaints of nausea or vomiting. IV removed intact, prescriptions given. Pt voices understanding of discharge instructions with no further questions. Patient discharged via wheelchair with NT  Allergies as of 08/29/2019      Reactions   Zyvox [linezolid] Other (See Comments)   Nerve damage (neuropathy) Retinal hemmorhage      Medication List    TAKE these medications   ALPRAZolam 0.25 MG tablet Commonly known as: XANAX Take 0.25 mg by mouth 2 (two) times daily as needed for anxiety or sleep.   amLODipine 2.5 MG tablet Commonly known as: NORVASC Take 2.5 mg by mouth daily.   aspirin EC 81 MG tablet Take 81 mg by mouth daily.   aspirin 81 MG EC tablet Take 1 tablet (81 mg total) by mouth daily.   atorvastatin 40 MG tablet Commonly known as: Lipitor Take 1 tablet (40 mg total) by mouth daily.   carvedilol 3.125 MG tablet Commonly known as: COREG Take 1 tablet (3.125 mg total) by mouth 2 (two) times daily with a meal. Hold for heart rate less than 60.   cloNIDine 0.1 MG tablet Commonly known as: CATAPRES Take 0.1 mg by mouth 3 (three) times daily.   clopidogrel 75 MG tablet Commonly known as: PLAVIX Take 75 mg by mouth daily.   oxyCODONE-acetaminophen 7.5-325 MG tablet Commonly known as: Percocet Take 1 tablet by mouth every 4 (four) hours as needed for severe pain.   Proventil HFA 108 (90 Base) MCG/ACT inhaler Generic drug: albuterol Inhale into the lungs.       Vitals:   08/29/19 1200 08/29/19 1353  BP:  (!) 177/95  Pulse:  (!) 105  Resp:  18  Temp:  98.2 F (36.8 C)  SpO2: 96% 90%    Theodore Obrien

## 2019-08-30 NOTE — Anesthesia Postprocedure Evaluation (Signed)
Anesthesia Post Note  Patient: Ayman Vicary  Procedure(s) Performed: XI ROBOTIC ASSISTED INGUINAL HERNIA REPAIR WITH MESH (Bilateral Groin)  Patient location during evaluation: PACU Anesthesia Type: General Level of consciousness: awake and alert and oriented Pain management: pain level controlled Vital Signs Assessment: post-procedure vital signs reviewed and stable Respiratory status: spontaneous breathing, nonlabored ventilation and respiratory function stable Cardiovascular status: blood pressure returned to baseline and stable Postop Assessment: no signs of nausea or vomiting Anesthetic complications: no  Pt admitted for BP management   Last Vitals:  Vitals:   08/29/19 1200 08/29/19 1353  BP:  (!) 177/95  Pulse:  (!) 105  Resp:  18  Temp:  36.8 C  SpO2: 96% 90%    Last Pain:  Vitals:   08/29/19 1010  TempSrc:   PainSc: 6                  Zollie Clemence

## 2019-09-17 ENCOUNTER — Other Ambulatory Visit: Payer: Self-pay

## 2019-09-17 ENCOUNTER — Inpatient Hospital Stay
Admission: EM | Admit: 2019-09-17 | Discharge: 2019-09-18 | DRG: 291 | Disposition: A | Discharge status: Home / Self Care | Payer: Medicare Other | Attending: Internal Medicine | Admitting: Internal Medicine

## 2019-09-17 ENCOUNTER — Emergency Department: Payer: Medicare Other

## 2019-09-17 ENCOUNTER — Encounter: Payer: Self-pay | Admitting: Radiology

## 2019-09-17 DIAGNOSIS — I248 Other forms of acute ischemic heart disease: Secondary | ICD-10-CM | POA: Diagnosis present

## 2019-09-17 DIAGNOSIS — Z79899 Other long term (current) drug therapy: Secondary | ICD-10-CM

## 2019-09-17 DIAGNOSIS — I5023 Acute on chronic systolic (congestive) heart failure: Secondary | ICD-10-CM | POA: Diagnosis present

## 2019-09-17 DIAGNOSIS — E440 Moderate protein-calorie malnutrition: Secondary | ICD-10-CM | POA: Insufficient documentation

## 2019-09-17 DIAGNOSIS — R7989 Other specified abnormal findings of blood chemistry: Secondary | ICD-10-CM | POA: Diagnosis present

## 2019-09-17 DIAGNOSIS — J9811 Atelectasis: Secondary | ICD-10-CM | POA: Diagnosis not present

## 2019-09-17 DIAGNOSIS — I252 Old myocardial infarction: Secondary | ICD-10-CM

## 2019-09-17 DIAGNOSIS — R778 Other specified abnormalities of plasma proteins: Secondary | ICD-10-CM

## 2019-09-17 DIAGNOSIS — Z87891 Personal history of nicotine dependence: Secondary | ICD-10-CM | POA: Diagnosis not present

## 2019-09-17 DIAGNOSIS — Z806 Family history of leukemia: Secondary | ICD-10-CM | POA: Diagnosis not present

## 2019-09-17 DIAGNOSIS — Z7982 Long term (current) use of aspirin: Secondary | ICD-10-CM | POA: Diagnosis not present

## 2019-09-17 DIAGNOSIS — I429 Cardiomyopathy, unspecified: Secondary | ICD-10-CM | POA: Diagnosis present

## 2019-09-17 DIAGNOSIS — Z8042 Family history of malignant neoplasm of prostate: Secondary | ICD-10-CM

## 2019-09-17 DIAGNOSIS — Z7902 Long term (current) use of antithrombotics/antiplatelets: Secondary | ICD-10-CM

## 2019-09-17 DIAGNOSIS — I2510 Atherosclerotic heart disease of native coronary artery without angina pectoris: Secondary | ICD-10-CM | POA: Diagnosis not present

## 2019-09-17 DIAGNOSIS — R0603 Acute respiratory distress: Secondary | ICD-10-CM | POA: Diagnosis present

## 2019-09-17 DIAGNOSIS — J810 Acute pulmonary edema: Secondary | ICD-10-CM

## 2019-09-17 DIAGNOSIS — K219 Gastro-esophageal reflux disease without esophagitis: Secondary | ICD-10-CM | POA: Diagnosis present

## 2019-09-17 DIAGNOSIS — I4510 Unspecified right bundle-branch block: Secondary | ICD-10-CM | POA: Diagnosis present

## 2019-09-17 DIAGNOSIS — J439 Emphysema, unspecified: Secondary | ICD-10-CM | POA: Diagnosis not present

## 2019-09-17 DIAGNOSIS — Z20822 Contact with and (suspected) exposure to covid-19: Secondary | ICD-10-CM | POA: Diagnosis present

## 2019-09-17 DIAGNOSIS — I110 Hypertensive heart disease with heart failure: Principal | ICD-10-CM | POA: Diagnosis present

## 2019-09-17 DIAGNOSIS — I501 Left ventricular failure: Secondary | ICD-10-CM | POA: Diagnosis not present

## 2019-09-17 DIAGNOSIS — Z8249 Family history of ischemic heart disease and other diseases of the circulatory system: Secondary | ICD-10-CM | POA: Diagnosis not present

## 2019-09-17 DIAGNOSIS — Z955 Presence of coronary angioplasty implant and graft: Secondary | ICD-10-CM

## 2019-09-17 DIAGNOSIS — Z79891 Long term (current) use of opiate analgesic: Secondary | ICD-10-CM

## 2019-09-17 DIAGNOSIS — I10 Essential (primary) hypertension: Secondary | ICD-10-CM | POA: Diagnosis not present

## 2019-09-17 DIAGNOSIS — J9601 Acute respiratory failure with hypoxia: Secondary | ICD-10-CM | POA: Diagnosis present

## 2019-09-17 DIAGNOSIS — R000 Tachycardia, unspecified: Secondary | ICD-10-CM | POA: Diagnosis present

## 2019-09-17 DIAGNOSIS — Z87442 Personal history of urinary calculi: Secondary | ICD-10-CM

## 2019-09-17 DIAGNOSIS — Z6822 Body mass index (BMI) 22.0-22.9, adult: Secondary | ICD-10-CM | POA: Diagnosis not present

## 2019-09-17 DIAGNOSIS — E785 Hyperlipidemia, unspecified: Secondary | ICD-10-CM | POA: Diagnosis present

## 2019-09-17 DIAGNOSIS — Z888 Allergy status to other drugs, medicaments and biological substances status: Secondary | ICD-10-CM

## 2019-09-17 LAB — BRAIN NATRIURETIC PEPTIDE: B Natriuretic Peptide: 676 pg/mL — ABNORMAL HIGH (ref 0.0–100.0)

## 2019-09-17 LAB — BASIC METABOLIC PANEL
Anion gap: 12 (ref 5–15)
BUN: 15 mg/dL (ref 8–23)
CO2: 28 mmol/L (ref 22–32)
Calcium: 9.1 mg/dL (ref 8.9–10.3)
Chloride: 97 mmol/L — ABNORMAL LOW (ref 98–111)
Creatinine, Ser: 1.14 mg/dL (ref 0.61–1.24)
GFR calc Af Amer: >60 mL/min (ref >60)
GFR calc non Af Amer: >60 mL/min (ref >60)
Glucose, Bld: 226 mg/dL — ABNORMAL HIGH (ref 70–99)
Potassium: 3.9 mmol/L (ref 3.5–5.1)
Sodium: 137 mmol/L (ref 135–145)

## 2019-09-17 LAB — CBC
HCT: 39.9 % (ref 39.0–52.0)
Hemoglobin: 12.6 g/dL — ABNORMAL LOW (ref 13.0–17.0)
MCH: 28.9 pg (ref 26.0–34.0)
MCHC: 31.6 g/dL (ref 30.0–36.0)
MCV: 91.5 fL (ref 80.0–100.0)
Platelets: 225 10*3/uL (ref 150–400)
RBC: 4.36 MIL/uL (ref 4.22–5.81)
RDW: 13.6 % (ref 11.5–15.5)
WBC: 10.8 10*3/uL — ABNORMAL HIGH (ref 4.0–10.5)
nRBC: 0 % (ref 0.0–0.2)

## 2019-09-17 LAB — HEPATIC FUNCTION PANEL
ALT: 14 U/L (ref 0–44)
AST: 20 U/L (ref 15–41)
Albumin: 4.2 g/dL (ref 3.5–5.0)
Alkaline Phosphatase: 78 U/L (ref 38–126)
Bilirubin, Direct: 0.3 mg/dL — ABNORMAL HIGH (ref 0.0–0.2)
Indirect Bilirubin: 1.9 mg/dL — ABNORMAL HIGH (ref 0.3–0.9)
Total Bilirubin: 2.2 mg/dL — ABNORMAL HIGH (ref 0.3–1.2)
Total Protein: 7.6 g/dL (ref 6.5–8.1)

## 2019-09-17 LAB — TROPONIN I (HIGH SENSITIVITY)
Troponin I (High Sensitivity): 212 ng/L (ref <18)
Troponin I (High Sensitivity): 297 ng/L (ref <18)

## 2019-09-17 LAB — TSH: TSH: 0.579 u[IU]/mL (ref 0.350–4.500)

## 2019-09-17 LAB — SARS CORONAVIRUS 2 BY RT PCR (HOSPITAL ORDER, PERFORMED IN ~~LOC~~ HOSPITAL LAB): SARS Coronavirus 2: NEGATIVE

## 2019-09-17 MED ORDER — ONDANSETRON HCL 4 MG/2ML IJ SOLN
4.0000 mg | Freq: Once | INTRAMUSCULAR | Status: AC
  Administered 2019-09-17: 4 mg via INTRAVENOUS
  Filled 2019-09-17: qty 2

## 2019-09-17 MED ORDER — ASPIRIN EC 81 MG PO TBEC: 81.0000 mg | DELAYED_RELEASE_TABLET | Freq: Every day | ORAL | Status: DC

## 2019-09-17 MED ORDER — FUROSEMIDE 10 MG/ML IJ SOLN
20.0000 mg | Freq: Two times a day (BID) | INTRAMUSCULAR | Status: DC
  Administered 2019-09-17: 20 mg via INTRAVENOUS
  Filled 2019-09-17 (×2): qty 2

## 2019-09-17 MED ORDER — OXYCODONE-ACETAMINOPHEN 7.5-325 MG PO TABS: 1.0000 | ORAL_TABLET | ORAL | Status: DC | PRN

## 2019-09-17 MED ORDER — LISINOPRIL 5 MG PO TABS
2.5000 mg | ORAL_TABLET | Freq: Every day | ORAL | Status: DC
  Administered 2019-09-17 – 2019-09-18 (×2): 2.5 mg via ORAL
  Filled 2019-09-17 (×2): qty 1

## 2019-09-17 MED ORDER — IOHEXOL 350 MG/ML SOLN
75.0000 mL | Freq: Once | INTRAVENOUS | Status: AC | PRN
  Administered 2019-09-17: 75 mL via INTRAVENOUS

## 2019-09-17 MED ORDER — ASPIRIN EC 81 MG PO TBEC
81.0000 mg | DELAYED_RELEASE_TABLET | Freq: Every day | ORAL | Status: DC
  Administered 2019-09-18: 81 mg via ORAL
  Filled 2019-09-17: qty 1

## 2019-09-17 MED ORDER — FUROSEMIDE 10 MG/ML IJ SOLN
40.0000 mg | Freq: Once | INTRAMUSCULAR | Status: DC
  Filled 2019-09-17: qty 4

## 2019-09-17 MED ORDER — SODIUM CHLORIDE 0.9% FLUSH
3.0000 mL | Freq: Two times a day (BID) | INTRAVENOUS | Status: DC
  Administered 2019-09-17 – 2019-09-18 (×2): 3 mL via INTRAVENOUS

## 2019-09-17 MED ORDER — CLOPIDOGREL BISULFATE 75 MG PO TABS
75.0000 mg | ORAL_TABLET | Freq: Every day | ORAL | Status: DC
  Administered 2019-09-18: 75 mg via ORAL
  Filled 2019-09-17: qty 1

## 2019-09-17 MED ORDER — ACETAMINOPHEN 325 MG PO TABS: 650.0000 mg | ORAL_TABLET | ORAL | Status: DC | PRN

## 2019-09-17 MED ORDER — CLONIDINE HCL 0.1 MG PO TABS
0.1000 mg | ORAL_TABLET | Freq: Three times a day (TID) | ORAL | Status: DC
  Administered 2019-09-17 – 2019-09-18 (×2): 0.1 mg via ORAL
  Filled 2019-09-17 (×2): qty 1

## 2019-09-17 MED ORDER — SODIUM CHLORIDE 0.9% FLUSH: 3.0000 mL | INTRAVENOUS | Status: DC | PRN

## 2019-09-17 MED ORDER — ALPRAZOLAM 0.25 MG PO TABS
0.2500 mg | ORAL_TABLET | Freq: Two times a day (BID) | ORAL | Status: DC | PRN
  Administered 2019-09-17 – 2019-09-18 (×2): 0.25 mg via ORAL
  Filled 2019-09-17 (×2): qty 1

## 2019-09-17 MED ORDER — ONDANSETRON HCL 4 MG/2ML IJ SOLN: 4.0000 mg | Freq: Once | INTRAMUSCULAR | Status: DC

## 2019-09-17 MED ORDER — FUROSEMIDE 10 MG/ML IJ SOLN
40.0000 mg | Freq: Once | INTRAMUSCULAR | Status: AC
  Administered 2019-09-17: 40 mg via INTRAVENOUS
  Filled 2019-09-17: qty 4

## 2019-09-17 MED ORDER — CARVEDILOL 3.125 MG PO TABS
3.1250 mg | ORAL_TABLET | Freq: Two times a day (BID) | ORAL | Status: DC
  Administered 2019-09-17 – 2019-09-18 (×2): 3.125 mg via ORAL
  Filled 2019-09-17 (×2): qty 1

## 2019-09-17 MED ORDER — ATORVASTATIN CALCIUM 20 MG PO TABS
40.0000 mg | ORAL_TABLET | Freq: Every day | ORAL | Status: DC
  Administered 2019-09-17: 40 mg via ORAL
  Filled 2019-09-17: qty 2

## 2019-09-17 MED ORDER — SODIUM CHLORIDE 0.9 % IV SOLN: 250.0000 mL | INTRAVENOUS | Status: DC | PRN

## 2019-09-17 MED ORDER — ENOXAPARIN SODIUM 40 MG/0.4ML ~~LOC~~ SOLN
40.0000 mg | SUBCUTANEOUS | Status: DC
  Administered 2019-09-17: 40 mg via SUBCUTANEOUS
  Filled 2019-09-17: qty 0.4

## 2019-09-17 MED ORDER — ONDANSETRON HCL 4 MG/2ML IJ SOLN: 4.0000 mg | Freq: Four times a day (QID) | INTRAMUSCULAR | Status: DC | PRN

## 2019-09-17 MED ORDER — AMLODIPINE BESYLATE 5 MG PO TABS
2.5000 mg | ORAL_TABLET | Freq: Every day | ORAL | Status: DC
  Administered 2019-09-18: 2.5 mg via ORAL
  Filled 2019-09-17: qty 1

## 2019-09-17 NOTE — Progress Notes (Signed)
   09/17/19 2100  ReDS Vest / Clip  Station Marker C  Ruler Value 30  ReDS Value Range < 36  ReDS Actual Value 25

## 2019-09-17 NOTE — ED Triage Notes (Signed)
Pt arrived via [facility] from home with breathing problems. Pt had an episode at home that got worse. 2 duonebs and 2 albuterols with ems. Pt tachy, right BBB. Hx of htn.

## 2019-09-17 NOTE — ED Provider Notes (Signed)
[facility] Emergency Department Provider Note  ____________________________________________   First MD Initiated Contact with Patient 09/17/19 1034     (approximate)  I have reviewed the triage vital signs and the nursing notes.   HISTORY  Chief Complaint Respiratory Distress    HPI Theodore Obrien is a 69 y.o. male with past medical history of CAD, hypertension, GERD, here with generalized weakness and shortness of breath.  The patient states that he was in his usual state of health yesterday.  Denies any recent medication or weight changes.  He states that he woke up this morning and initially felt okay, but since then has had progressively worsening and severe shortness of breath.  He feels like he cannot catch his breath.  He has had increased coughing.  Denies any sputum production.  Denies any known fevers or chills.  He has had slight increase in lower extremity swelling.  No tenderness along the posterior legs.  No history of DVT/PE.  No other acute complaints.  Shortness of breath is worse with lying flat and exertion.       Past Medical History:  Diagnosis Date  . Dyspnea   . GERD (gastroesophageal reflux disease)   . History of kidney stones   . Hypertension   . Myocardial infarction [facility]) 2020    Patient Active Problem List   Diagnosis Date Noted  . Acute cardiogenic pulmonary edema ([place]) 09/17/2019  . Hypertensive urgency 08/28/2019  . CAD (coronary artery disease) 08/28/2019  . Acute respiratory failure with hypoxia ([place]) 12/27/2018  . Anxiety state 07/23/2015  . Benign essential HTN 07/23/2015  . Drug-induced peripheral neuropathy ([place]) 07/23/2015  . Opioid dependence with withdrawal ([place])   . Opiate dependence ([place]) 03/23/2015  . Hernia of abdominal cavity 12/09/2014    Past Surgical History:  Procedure Laterality Date  . CARPAL TUNNEL RELEASE Left   . CORONARY STENT INTERVENTION N/A 12/31/2018   Procedure: CORONARY STENT  INTERVENTION;  Surgeon: Yolonda Kida, MD;  Location: [place] [facility];  Service: Cardiovascular;  Laterality: N/A;  . FEMORAL HERNIA REPAIR Left 10/27/2014   Procedure: Repair of left femoral and direct hernia with mesh ;  Surgeon: Sherri Rad, MD;  Location: [facility] ORS;  Service: General;  Laterality: Left;  . FRACTURE SURGERY     right leg  . LEFT HEART CATH AND CORONARY ANGIOGRAPHY N/A 12/31/2018   Procedure: LEFT HEART CATH AND CORONARY ANGIOGRAPHY;  Surgeon: Corey Skains, MD;  Location: [place] [facility];  Service: Cardiovascular;  Laterality: N/A;  . XI ROBOTIC ASSISTED INGUINAL HERNIA REPAIR WITH MESH Bilateral 08/28/2019   Procedure: XI ROBOTIC ASSISTED INGUINAL HERNIA REPAIR WITH MESH;  Surgeon: Herbert Pun, MD;  Location: [facility] ORS;  Service: General;  Laterality: Bilateral;    Prior to Admission medications   Medication Sig Start Date End Date Taking? Authorizing Provider  albuterol (PROVENTIL HFA) 108 (90 Base) MCG/ACT inhaler Inhale 2 puffs into the lungs every 6 (six) hours as needed for wheezing or shortness of breath.    Yes [provider]  ALPRAZolam (XANAX) 0.25 MG tablet Take 0.25 mg by mouth 2 (two) times daily as needed for anxiety or sleep.  08/03/17  Yes [provider]  amLODipine (NORVASC) 5 MG tablet Take 5 mg by mouth daily.    Yes [provider]  aspirin EC 81 MG EC tablet Take 1 tablet (81 mg total) by mouth daily. 01/02/19  Yes Ojie, Jude, MD  atorvastatin (LIPITOR) 40 MG  tablet Take 40 mg by mouth daily.   Yes [provider]  carvedilol (COREG) 3.125 MG tablet Take 1 tablet (3.125 mg total) by mouth 2 (two) times daily with a meal. Hold for heart rate less than 60. 01/01/19  Yes Ojie, Jude, MD  cloNIDine (CATAPRES) 0.1 MG tablet Take 0.1 mg by mouth 3 (three) times daily.  06/19/17  Yes [provider]  clopidogrel (PLAVIX) 75 MG tablet Take 75 mg by mouth daily.   Yes [provider]    oxyCODONE-acetaminophen (PERCOCET) 7.5-325 MG tablet Take 1 tablet by mouth every 4 (four) hours as needed for severe pain. 08/29/19 08/28/20 Yes Herbert Pun, MD    Allergies Zyvox [linezolid]  Family History  Problem Relation Age of Onset  . Hypertension Mother   . Heart disease Mother   . Prostate cancer Father   . Leukemia Father     Social History Social History   Tobacco Use  . Smoking status: Former Smoker    Packs/day: 1.00    Years: 6.00    Pack years: 6.00    Types: Cigarettes    Quit date: 2018    Years since quitting: 3.3  . Smokeless tobacco: Never Used  . Tobacco comment: quit smoking 2018  Substance Use Topics  . Alcohol use: Yes    Comment: occasionally beer  . Drug use: No    Review of Systems  Review of Systems  Constitutional: Positive for fatigue. Negative for chills and fever.  HENT: Negative for sore throat.   Respiratory: Positive for cough and shortness of breath.   Cardiovascular: Positive for leg swelling. Negative for chest pain.  Gastrointestinal: Negative for abdominal pain.  Genitourinary: Negative for flank pain.  Musculoskeletal: Negative for neck pain.  Skin: Negative for rash and wound.  Allergic/Immunologic: Negative for immunocompromised state.  Neurological: Negative for weakness and numbness.  Hematological: Does not bruise/bleed easily.  All other systems reviewed and are negative.    ____________________________________________  PHYSICAL EXAM:      VITAL SIGNS: ED Triage Vitals  Enc Vitals Group     BP 09/17/19 1001 (!) 161/104     Pulse Rate 09/17/19 1100 (!) 103     Resp 09/17/19 1000 14     Temp 09/17/19 1002 99 F (37.2 C)     Temp Source 09/17/19 1002 Oral     SpO2 09/17/19 1100 91 %     Weight 09/17/19 0959 175 lb (79.4 kg)     Height 09/17/19 0959 6\' 1"  (1.854 m)     Head Circumference --      Peak Flow --      Pain Score 09/17/19 0959 0     Pain Loc --      Pain Edu? --      Excl. in [place]? --       Physical Exam Vitals and nursing note reviewed.  Constitutional:      General: He is not in acute distress.    Appearance: He is well-developed.  HENT:     Head: Normocephalic and atraumatic.  Eyes:     Conjunctiva/sclera: Conjunctivae normal.  Cardiovascular:     Rate and Rhythm: Regular rhythm. Tachycardia present.     Heart sounds: Normal heart sounds. No murmur. No friction rub.  Pulmonary:     Effort: Pulmonary effort is normal. Tachypnea present. No respiratory distress.     Breath sounds: Examination of the right-lower field reveals rales. Examination of the left-lower field reveals rales. Decreased  breath sounds and rales present. No wheezing.  Abdominal:     General: There is no distension.     Palpations: Abdomen is soft.     Tenderness: There is no abdominal tenderness.  Musculoskeletal:     Cervical back: Neck supple.  Skin:    General: Skin is warm.     Capillary Refill: Capillary refill takes less than 2 seconds.  Neurological:     Mental Status: He is alert and oriented to person, place, and time.     Motor: No abnormal muscle tone.       ____________________________________________   LABS (all labs ordered are listed, but only abnormal results are displayed)  Labs Reviewed  CBC - Abnormal; Notable for the following components:      Result Value   WBC 10.8 (*)    Hemoglobin 12.6 (*)    All other components within normal limits  BASIC METABOLIC PANEL - Abnormal; Notable for the following components:   Chloride 97 (*)    Glucose, Bld 226 (*)    All other components within normal limits  BRAIN NATRIURETIC PEPTIDE - Abnormal; Notable for the following components:   B Natriuretic Peptide 676.0 (*)    All other components within normal limits  HEPATIC FUNCTION PANEL - Abnormal; Notable for the following components:   Total Bilirubin 2.2 (*)    Bilirubin, Direct 0.3 (*)    Indirect Bilirubin 1.9 (*)    All other components within normal limits   TROPONIN I (HIGH SENSITIVITY) - Abnormal; Notable for the following components:   Troponin I (High Sensitivity) 212 (*)    All other components within normal limits  TROPONIN I (HIGH SENSITIVITY) - Abnormal; Notable for the following components:   Troponin I (High Sensitivity) 297 (*)    All other components within normal limits  SARS CORONAVIRUS 2 BY RT PCR (HOSPITAL ORDER, [facility])  TSH  BASIC METABOLIC PANEL    ____________________________________________  EKG: sinus tachycardia, VR 135. QRS 150, QTc 516. RBBB, with R axis 220. No acute ST elevations. Non-specific ST changes likely rate related, worse in inferior leads. ________________________________________  RADIOLOGY All imaging, including plain films, CT scans, and ultrasounds, independently reviewed by me, and interpretations confirmed via formal radiology reads.  ED MD interpretation:   CXR: Interstitial edema  Official radiology report(s): DG Chest 2 View  Result Date: 09/17/2019 CLINICAL DATA:  Shortness of breath EXAM: CHEST - 2 VIEW COMPARISON:  08/28/2018 FINDINGS: Borderline heart size. New interstitial opacity with Kerley lines. No pleural effusion or pneumothorax. Stable aortic tortuosity. IMPRESSION: Interstitial edema. Electronically Signed   By: Monte Fantasia M.D.   On: 09/17/2019 10:37   CT Angio Chest PE W and/or Wo Contrast  Result Date: 09/17/2019 CLINICAL DATA:  Shortness of breath. EXAM: CT ANGIOGRAPHY CHEST WITH CONTRAST TECHNIQUE: Multidetector CT imaging of the chest was performed using the standard protocol during bolus administration of intravenous contrast. Multiplanar CT image reconstructions and MIPs were obtained to evaluate the vascular anatomy. CONTRAST:  43mL OMNIPAQUE IOHEXOL 350 MG/ML SOLN COMPARISON:  None. FINDINGS: Cardiovascular: Satisfactory opacification of the pulmonary arteries to the segmental level. No evidence of pulmonary embolism. Normal heart size.  No pericardial effusion. Coronary artery calcifications are noted. 4.3 cm ascending thoracic aortic aneurysm is noted. Mediastinum/Nodes: No enlarged mediastinal, hilar, or axillary lymph nodes. Thyroid gland, trachea, and esophagus demonstrate no significant findings. Lungs/Pleura: No pneumothorax is noted. Mild bilateral posterior basilar subsegmental atelectasis is noted. 4 mm nodule  is noted laterally in the right middle lobe best seen on image number 78 of series 6. Upper Abdomen: No acute abnormality. Musculoskeletal: No chest wall abnormality. No acute or significant osseous findings. Review of the MIP images confirms the above findings. IMPRESSION: 1. No definite evidence of pulmonary embolus. 2. Coronary artery calcifications are noted suggesting coronary artery disease. 3. 4.3 cm ascending thoracic aortic aneurysm is noted. Recommend annual imaging followup by CTA or MRA. This recommendation follows 2010 ACCF/AHA/AATS/ACR/ASA/SCA/SCAI/SIR/STS/SVM Guidelines for the Diagnosis and Management of Patients with Thoracic Aortic Disease. Circulation. 2010; [id number]. Aortic aneurysm NOS (ICD10-I71.9). 4. Mild bilateral posterior basilar subsegmental atelectasis is noted. 5. 4 mm nodule is noted laterally in the right middle lobe. No follow-up needed if patient is low-risk. Non-contrast chest CT can be considered in 12 months if patient is high-risk. This recommendation follows the consensus statement: Guidelines for Management of Incidental Pulmonary Nodules Detected on CT Images: From the Fleischner Society 2017; Radiology 2017; 284:228-243. Aortic Atherosclerosis (ICD10-I70.0). Electronically Signed   By: Marijo Conception M.D.   On: 09/17/2019 14:34    ____________________________________________  PROCEDURES   Procedure(s) performed (including Critical Care):  .Critical Care Performed by: Duffy Bruce, MD Authorized by: Duffy Bruce, MD   Critical care provider statement:    Critical  care time (minutes):  35   Critical care time was exclusive of:  Separately billable procedures and treating other patients and teaching time   Critical care was necessary to treat or prevent imminent or life-threatening deterioration of the following conditions:  Circulatory failure, cardiac failure and respiratory failure   Critical care was time spent personally by me on the following activities:  Development of treatment plan with patient or surrogate, discussions with consultants, evaluation of patient's response to treatment, examination of patient, obtaining history from patient or surrogate, ordering and performing treatments and interventions, ordering and review of laboratory studies, ordering and review of radiographic studies, pulse oximetry, re-evaluation of patient's condition and review of old charts   I assumed direction of critical care for this patient from another provider in my specialty: no   .1-3 Lead EKG Interpretation Performed by: Duffy Bruce, MD Authorized by: Duffy Bruce, MD     Interpretation: normal     ECG rate:  90-100   ECG rate assessment: normal     Rhythm: sinus rhythm     Ectopy: PVCs     Conduction: normal   Comments:     Indication: chf, resp failure    ____________________________________________  INITIAL IMPRESSION / MDM / ASSESSMENT AND PLAN / ED COURSE  As part of my medical decision making, I reviewed the following data within the [place] notes reviewed and incorporated, Old chart reviewed, Notes from prior ED visits, and [place] Controlled Substance Database       *Theodore Obrien was evaluated in Emergency Department on 09/17/2019 for the symptoms described in the history of present illness. He was evaluated in the context of the global COVID-19 pandemic, which necessitated consideration that the patient might be at risk for infection with the SARS-CoV-2 virus that causes COVID-19. Institutional protocols and  algorithms that pertain to the evaluation of patients at risk for COVID-19 are in a state of rapid change based on information released by regulatory bodies including the CDC and federal and state organizations. These policies and algorithms were followed during the patient's care in the ED.  Some ED evaluations and interventions may be delayed as a result of limited  staffing during the pandemic.*  Clinical Course as of Sep 16 1857  Tue Sep 16, 7896  [id number] 69 year old male here with acute hypoxic respiratory failure.  Presentation is fairly acute in onset and likely secondary to CHF with pulmonary edema.  BNP and troponin both elevated, and chest x-ray shows interstitial edema.  He appears mildly hypervolemic on exam.  Will start Lasix, oxygen, and admit.  Regarding his troponin elevation, he has no chest pain and EKG shows no evidence of acute ischemia.  CT angio was obtained and shows no evidence of PE.  He has a thoracic aneurysm which I suspect can be managed as an outpatient.   [CI]    Clinical Course User Index [CI] Duffy Bruce, MD    Medical Decision Making:  69 yo M with PMHx as above here with SOB, cough. Admit to medicine. EKG nonischemic.   ____________________________________________  FINAL CLINICAL IMPRESSION(S) / ED DIAGNOSES  Final diagnoses:  Acute respiratory failure with hypoxia (HCC)  Acute pulmonary edema (HCC)     MEDICATIONS GIVEN DURING THIS VISIT:  Medications  ALPRAZolam (XANAX) tablet 0.25 mg (0.25 mg Oral Given 09/17/19 1744)  cloNIDine (CATAPRES) tablet 0.1 mg (has no administration in time range)  aspirin EC tablet 81 mg (has no administration in time range)  carvedilol (COREG) tablet 3.125 mg (has no administration in time range)  atorvastatin (LIPITOR) tablet 40 mg (has no administration in time range)  clopidogrel (PLAVIX) tablet 75 mg (has no administration in time range)  amLODipine (NORVASC) tablet 2.5 mg (has no administration in time range)   oxyCODONE-acetaminophen (PERCOCET) 7.5-325 MG per tablet 1 tablet (has no administration in time range)  sodium chloride flush (NS) 0.9 % injection 3 mL (has no administration in time range)  sodium chloride flush (NS) 0.9 % injection 3 mL (has no administration in time range)  0.9 %  sodium chloride infusion (has no administration in time range)  acetaminophen (TYLENOL) tablet 650 mg (has no administration in time range)  ondansetron (ZOFRAN) injection 4 mg (has no administration in time range)  enoxaparin (LOVENOX) injection 40 mg (has no administration in time range)  furosemide (LASIX) injection 20 mg (has no administration in time range)  lisinopril (ZESTRIL) tablet 2.5 mg (has no administration in time range)  furosemide (LASIX) injection 40 mg (40 mg Intravenous Given 09/17/19 1120)  ondansetron (ZOFRAN) injection 4 mg (4 mg Intravenous Given 09/17/19 1158)  iohexol (OMNIPAQUE) 350 MG/ML injection 75 mL (75 mLs Intravenous Contrast Given 09/17/19 1413)     ED Discharge Orders         Ordered    Amb Referral to HF Clinic     09/17/19 1459           Note:  This document was prepared using Dragon voice recognition software and may include unintentional dictation errors.   Duffy Bruce, MD 09/17/19 1859

## 2019-09-17 NOTE — H&P (Addendum)
[facility] NAME: Theodore Obrien    MR#:  [id number]  DATE OF BIRTH:  December 20, 1950  DATE OF ADMISSION:  09/17/2019  PRIMARY CARE PHYSICIAN: Tracie Harrier, MD   REQUESTING/REFERRING PHYSICIAN: Duffy Bruce, MD  CHIEF COMPLAINT:   Chief Complaint  Patient presents with  . Respiratory Distress    HISTORY OF PRESENT ILLNESS:  Theodore Obrien  is a 69 y.o. male with a known history of CAD, HTN, GERD is being admitted for acute hypoxic resp failure likely due to flash pulmo edema. He was feeling fine when went to bed but he woke up this morning feeling SOB then has had progressively worsening and severe shortness of breath.  He feels like he cannot catch his breath.  He has had increased coughing.  Denies any sputum production.  Denies any known fevers or chills.  He has had slight increase in lower extremity swelling. Shortness of breath is worse with lying flat and exertion. Reports b/l laproscopic hernia repair surgery on 4/28. Initially required 6 liters O2 via [place] and weaned down to 4 liters while in ED. PAST MEDICAL HISTORY:   Past Medical History:  Diagnosis Date  . Dyspnea   . GERD (gastroesophageal reflux disease)   . History of kidney stones   . Hypertension   . Myocardial infarction [facility]) 2020   PAST SURGICAL HISTORY:   Past Surgical History:  Procedure Laterality Date  . CARPAL TUNNEL RELEASE Left   . CORONARY STENT INTERVENTION N/A 12/31/2018   Procedure: CORONARY STENT INTERVENTION;  Surgeon: Yolonda Kida, MD;  Location: [place] [facility];  Service: Cardiovascular;  Laterality: N/A;  . FEMORAL HERNIA REPAIR Left 10/27/2014   Procedure: Repair of left femoral and direct hernia with mesh ;  Surgeon: Sherri Rad, MD;  Location: [facility];  Service: General;  Laterality: Left;  . FRACTURE SURGERY     right leg  . LEFT HEART CATH AND CORONARY ANGIOGRAPHY N/A 12/31/2018   Procedure: LEFT HEART CATH AND CORONARY ANGIOGRAPHY;   Surgeon: Corey Skains, MD;  Location: [place] [facility];  Service: Cardiovascular;  Laterality: N/A;  . XI ROBOTIC ASSISTED INGUINAL HERNIA REPAIR WITH MESH Bilateral 08/28/2019   Procedure: XI ROBOTIC ASSISTED INGUINAL HERNIA REPAIR WITH MESH;  Surgeon: Herbert Pun, MD;  Location: [facility];  Service: General;  Laterality: Bilateral;   SOCIAL HISTORY:   Social History   Tobacco Use  . Smoking status: Former Smoker    Packs/day: 1.00    Years: 6.00    Pack years: 6.00    Types: Cigarettes    Quit date: 2018    Years since quitting: 3.3  . Smokeless tobacco: Never Used  . Tobacco comment: quit smoking 2018  Substance Use Topics  . Alcohol use: Yes    Comment: occasionally beer   FAMILY HISTORY:   Family History  Problem Relation Age of Onset  . Hypertension Mother   . Heart disease Mother   . Prostate cancer Father   . Leukemia Father    DRUG ALLERGIES:   Allergies  Allergen Reactions  . Zyvox [Linezolid] Other (See Comments)    Nerve damage (neuropathy) Retinal hemmorhage   REVIEW OF SYSTEMS:  Review of Systems  Constitutional: Negative for diaphoresis, fever, malaise/fatigue and weight loss.  HENT: Negative for ear discharge, ear pain, hearing loss, nosebleeds, sore throat and tinnitus.   Eyes: Negative for blurred vision and pain.  Respiratory: Positive for shortness of breath. Negative for cough,  hemoptysis and wheezing.   Cardiovascular: Positive for orthopnea and leg swelling. Negative for chest pain and palpitations.  Gastrointestinal: Negative for abdominal pain, blood in stool, constipation, diarrhea, heartburn, nausea and vomiting.  Genitourinary: Negative for dysuria, frequency and urgency.  Musculoskeletal: Negative for back pain and myalgias.  Skin: Negative for itching and rash.  Neurological: Negative for dizziness, tingling, tremors, focal weakness, seizures, weakness and headaches.  Psychiatric/Behavioral: Negative for depression. The  patient is not nervous/anxious.    MEDICATIONS AT HOME:   Prior to Admission medications   Medication Sig Start Date End Date Taking? Authorizing Provider  albuterol (PROVENTIL HFA) 108 (90 Base) MCG/ACT inhaler Inhale into the lungs. 08/03/17 08/28/19  [provider]  ALPRAZolam Duanne Moron) 0.25 MG tablet Take 0.25 mg by mouth 2 (two) times daily as needed for anxiety or sleep.  08/03/17   [provider]  amLODipine (NORVASC) 2.5 MG tablet Take 2.5 mg by mouth daily.    [provider]  aspirin EC 81 MG EC tablet Take 1 tablet (81 mg total) by mouth daily. Patient not taking: Reported on 08/20/2019 01/02/19   Otila Back, MD  aspirin EC 81 MG tablet Take 81 mg by mouth daily.    [provider]  atorvastatin (LIPITOR) 40 MG tablet Take 1 tablet (40 mg total) by mouth daily. 01/01/19 08/22/19  Otila Back, MD  carvedilol (COREG) 3.125 MG tablet Take 1 tablet (3.125 mg total) by mouth 2 (two) times daily with a meal. Hold for heart rate less than 60. 01/01/19   Ojie, Jude, MD  cloNIDine (CATAPRES) 0.1 MG tablet Take 0.1 mg by mouth 3 (three) times daily.  06/19/17   [provider]  clopidogrel (PLAVIX) 75 MG tablet Take 75 mg by mouth daily.    [provider]  oxyCODONE-acetaminophen (PERCOCET) 7.5-325 MG tablet Take 1 tablet by mouth every 4 (four) hours as needed for severe pain. 08/29/19 08/28/20  Herbert Pun, MD    VITAL SIGNS:  Blood pressure 108/81, pulse 100, temperature 99 F (37.2 C), temperature source Oral, resp. rate 18, height 6\' 1"  (1.854 m), weight 79.4 kg, SpO2 92 %. PHYSICAL EXAMINATION:  Physical Exam  GENERAL:  69 y.o.-year-old patient lying in the bed with no acute distress.  EYES: Pupils equal, round, reactive to light and accommodation. No scleral icterus. Extraocular muscles intact.  HEENT: Head atraumatic, normocephalic. Oropharynx and nasopharynx clear.  NECK:  Supple, no jugular venous distention. No thyroid  enlargement, no tenderness.  LUNGS: Decreased breath sounds bilaterally, no wheezing, rales,rhonchi or crepitation. No use of accessory muscles of respiration.  CARDIOVASCULAR: S1, S2 normal. No murmurs, rubs, or gallops.  ABDOMEN: Soft, nontender, nondistended. Bowel sounds present. No organomegaly or mass.  EXTREMITIES: No pedal edema, cyanosis, or clubbing.  NEUROLOGIC: Cranial nerves II through XII are intact. Muscle strength 5/5 in all extremities. Sensation intact. Gait not checked.  PSYCHIATRIC: The patient is alert and oriented x 3.  SKIN: No obvious rash, lesion, or ulcer.  LABORATORY PANEL:   CBC Recent Labs  Lab 09/17/19 1003  WBC 10.8*  HGB 12.6*  HCT 39.9  PLT 225   ------------------------------------------------------------------------------------------------------------------  Chemistries  Recent Labs  Lab 09/17/19 1003  NA 137  K 3.9  CL 97*  CO2 28  GLUCOSE 226*  BUN 15  CREATININE 1.14  CALCIUM 9.1  AST 20  ALT 14  ALKPHOS 78  BILITOT 2.2*   ------------------------------------------------------------------------------------------------------------------  Cardiac Enzymes No results for input(s): TROPONINI in the last 168 hours. ------------------------------------------------------------------------------------------------------------------  RADIOLOGY:  DG Chest 2 View  Result Date: 09/17/2019 CLINICAL DATA:  Shortness of breath EXAM: CHEST - 2 VIEW COMPARISON:  08/28/2018 FINDINGS: Borderline heart size. New interstitial opacity with Kerley lines. No pleural effusion or pneumothorax. Stable aortic tortuosity. IMPRESSION: Interstitial edema. Electronically Signed   By: Monte Fantasia M.D.   On: 09/17/2019 10:37   CT Angio Chest PE W and/or Wo Contrast  Result Date: 09/17/2019 CLINICAL DATA:  Shortness of breath. EXAM: CT ANGIOGRAPHY CHEST WITH CONTRAST TECHNIQUE: Multidetector CT imaging of the chest was performed using the standard protocol  during bolus administration of intravenous contrast. Multiplanar CT image reconstructions and MIPs were obtained to evaluate the vascular anatomy. CONTRAST:  37mL OMNIPAQUE IOHEXOL 350 MG/ML SOLN COMPARISON:  None. FINDINGS: Cardiovascular: Satisfactory opacification of the pulmonary arteries to the segmental level. No evidence of pulmonary embolism. Normal heart size. No pericardial effusion. Coronary artery calcifications are noted. 4.3 cm ascending thoracic aortic aneurysm is noted. Mediastinum/Nodes: No enlarged mediastinal, hilar, or axillary lymph nodes. Thyroid gland, trachea, and esophagus demonstrate no significant findings. Lungs/Pleura: No pneumothorax is noted. Mild bilateral posterior basilar subsegmental atelectasis is noted. 4 mm nodule is noted laterally in the right middle lobe best seen on image number 78 of series 6. Upper Abdomen: No acute abnormality. Musculoskeletal: No chest wall abnormality. No acute or significant osseous findings. Review of the MIP images confirms the above findings. IMPRESSION: 1. No definite evidence of pulmonary embolus. 2. Coronary artery calcifications are noted suggesting coronary artery disease. 3. 4.3 cm ascending thoracic aortic aneurysm is noted. Recommend annual imaging followup by CTA or MRA. This recommendation follows 2010 ACCF/AHA/AATS/ACR/ASA/SCA/SCAI/SIR/STS/SVM Guidelines for the Diagnosis and Management of Patients with Thoracic Aortic Disease. Circulation. 2010; [id number]. Aortic aneurysm NOS (ICD10-I71.9). 4. Mild bilateral posterior basilar subsegmental atelectasis is noted. 5. 4 mm nodule is noted laterally in the right middle lobe. No follow-up needed if patient is low-risk. Non-contrast chest CT can be considered in 12 months if patient is high-risk. This recommendation follows the consensus statement: Guidelines for Management of Incidental Pulmonary Nodules Detected on CT Images: From the Fleischner Society 2017; Radiology 2017; 284:228-243.  Aortic Atherosclerosis (ICD10-I70.0). Electronically Signed   By: Marijo Conception M.D.   On: 09/17/2019 14:34   IMPRESSION AND PLAN:  69 year old man with k/h/o essential hypertension, NSTEMI, CAD status post stent placement, COPD, emphysema, recent bilateral inguinal hernia repair on 08/28/19 is being admitted for acute hypoxic resp failure due to flash pulmo edema  Acute hypoxic resp failure - due to flash pulmo edema - initially needed 6 liters O2 via [place] - weaned down to 4 liters after diuresis in ED with 40 mg IV Lasix - wean O2 as able - CT Angio neg for PE  Acute on chronic systolic CHF - last echo in 8/20 showed EF 30-35% - monitor on PCU - elevated BNP (676) - serial troponins - daily weights, strict I & Os - lasix 20 mg IV bid - continue coreg and add lisinopril - cardio c/s - secure chat sent to Dr Nehemiah Massed - echo recheck - consider RedsVest reading once on PCU (baseline and before D/C). - CHF clinic f/up at D/C  Elevated troponins Continue to trend, monitor on tele 212->297 - no chest pain or ischemic changes on EKG  Essential HTN Continue norvasc, coreg, clonidine, add lisinopril. Adjust as need    All the records are reviewed and case discussed with ED provider. Management plans discussed with the patient, nursing and they are  in agreement.  CODE STATUS: FULL CODE  TOTAL TIME TAKING CARE OF THIS PATIENT: 45 minutes.    Max Sane M.D on 09/17/2019 at 2:59 PM  [facility]   CC: Primary care physician; Tracie Harrier, MD   Note: This dictation was prepared with Dragon dictation along with smaller phrase technology. Any transcriptional errors that result from this process are unintentional.

## 2019-09-18 ENCOUNTER — Inpatient Hospital Stay
Admit: 2019-09-18 | Discharge: 2019-09-18 | Disposition: A | Discharge status: Home / Self Care | Payer: Medicare Other | Attending: Internal Medicine | Admitting: Internal Medicine

## 2019-09-18 DIAGNOSIS — E440 Moderate protein-calorie malnutrition: Secondary | ICD-10-CM | POA: Insufficient documentation

## 2019-09-18 DIAGNOSIS — I501 Left ventricular failure: Secondary | ICD-10-CM

## 2019-09-18 DIAGNOSIS — I110 Hypertensive heart disease with heart failure: Secondary | ICD-10-CM | POA: Diagnosis not present

## 2019-09-18 DIAGNOSIS — R0603 Acute respiratory distress: Secondary | ICD-10-CM | POA: Diagnosis not present

## 2019-09-18 LAB — BASIC METABOLIC PANEL
Anion gap: 10 (ref 5–15)
BUN: 18 mg/dL (ref 8–23)
CO2: 32 mmol/L (ref 22–32)
Calcium: 8.8 mg/dL — ABNORMAL LOW (ref 8.9–10.3)
Chloride: 97 mmol/L — ABNORMAL LOW (ref 98–111)
Creatinine, Ser: 1.07 mg/dL (ref 0.61–1.24)
GFR calc Af Amer: >60 mL/min (ref >60)
GFR calc non Af Amer: >60 mL/min (ref >60)
Glucose, Bld: 127 mg/dL — ABNORMAL HIGH (ref 70–99)
Potassium: 4.3 mmol/L (ref 3.5–5.1)
Sodium: 139 mmol/L (ref 135–145)

## 2019-09-18 LAB — ECHOCARDIOGRAM COMPLETE
Height: 73 in
Weight: 2739.2 oz

## 2019-09-18 MED ORDER — ACETAMINOPHEN 325 MG PO TABS: 650.0000 mg | ORAL_TABLET | ORAL | Status: DC | PRN

## 2019-09-18 MED ORDER — ENSURE ENLIVE PO LIQD
237.0000 mL | Freq: Two times a day (BID) | ORAL | Status: DC
  Administered 2019-09-18: 237 mL via ORAL

## 2019-09-18 MED ORDER — ADULT MULTIVITAMIN W/MINERALS CH: 1.0000 | ORAL_TABLET | Freq: Every day | ORAL | Status: DC

## 2019-09-18 MED ORDER — FUROSEMIDE 10 MG/ML IJ SOLN
40.0000 mg | Freq: Two times a day (BID) | INTRAMUSCULAR | Status: DC
  Administered 2019-09-18: 40 mg via INTRAVENOUS
  Filled 2019-09-18: qty 4

## 2019-09-18 MED ORDER — LISINOPRIL 2.5 MG PO TABS: 2.5000 mg | ORAL_TABLET | Freq: Every day | ORAL | 0 refills | Status: DC

## 2019-09-18 NOTE — Plan of Care (Signed)

## 2019-09-18 NOTE — Discharge Summary (Signed)
Physician Discharge Summary  Kanaan Goodspeed [id number] DOB: July 20, 1950 DOA: 09/17/2019  PCP: Tracie Harrier, MD  Admit date: 09/17/2019 Discharge date: 09/18/2019  Admitted From: Home Disposition: Home  Recommendations for Outpatient Follow-up:  1. Follow up with PCP in 1-2 weeks 2. Follow-up with cardiology in 1 to 2 weeks  Home Health: No Equipment/Devices: None Discharge Condition: Stable CODE STATUS: Full Diet recommendation: Heart Healthy  Brief/Interim Summary: Adam Rentschler  is a 69 y.o. male with a known history of CAD, HTN, GERD is being admitted for acute hypoxic resp failure likely due to flash pulmo edema. He was feeling fine when went to bed but he woke up this morning feeling SOB then has had progressively worsening and severe shortness of breath. He feels like he cannot catch his breath. He has had increased coughing. Denies any sputum production. Denies any known fevers or chills. He has had slight increase in lower extremity swelling.Shortness of breath is worse with lying flat and exertion. Reports b/l laproscopic hernia repair surgery on 4/28. Initially required 6 liters O2 via [place] and weaned down to 4 liters while in ED.  5/19: Patient's respiratory status improved.  Weaned to room air after several doses of IV diuretic.  Evaluated by cardiology.  No further diagnostics indicated at this time.  Recommended to patient that the most conservative approach would be to monitor overnight to ensure no reliance on supplemental oxygen.  Patient indicated that he needed to get home today due to some obligations outside the hospital.  I agreed that if patient was able to wean to room air that I could discharge him today.  Patient was able to wean to room air successfully.  Ambulated by nursing prior to discharge.  Did desaturate briefly but saturations recovered quickly.  Okay for discharge at this time.  Will not make any changes in home medication regimen.  Echocardiogram  was completed in house and is pending at time of discharge.  To be followed as outpatient by cardiology.  Also outpatient consideration for initiation of Entresto versus [place].  Discharge Diagnoses:  Active Problems:   Acute cardiogenic pulmonary edema (HCC)   Malnutrition of moderate degree  Acute hypoxic resp failure  due to flash pulmo edema Possibly secondary uncontrolled hypertension Initially needed 6 L Weaned to room air at time of discharge CT angio negative for pulmonary embolism Outpatient cardiology follow-up  Acute on chronic systolic CHF Last echocardiogram in 8/20 showed EF of 30 to 35% Responded quickly to diuretics Not volume overloaded on examination Reds vest indicate low reading Will have patient see cardiology as follow-up within 1 to 2 weeks for consideration of outpatient diuretic therapy Optimize goal-directed medical therapy at this time  Elevated troponins Likely demand ischemia  Essential HTN Continue norvasc, coreg, clonidine, add lisinopril.    Discharge Instructions  Discharge Instructions    (HEART FAILURE PATIENTS) Call MD:  Anytime you have any of the following symptoms: 1) 3 pound weight gain in 24 hours or 5 pounds in 1 week 2) shortness of breath, with or without a dry hacking cough 3) swelling in the hands, feet or stomach 4) if you have to sleep on extra pillows at night in order to breathe.   Complete by: As directed    Amb Referral to HF Clinic   Complete by: As directed    Diet - low sodium heart healthy   Complete by: As directed    Increase activity slowly   Complete by: As directed  Allergies as of 09/18/2019      Reactions   Zyvox [linezolid] Other (See Comments)   Nerve damage (neuropathy) Retinal hemmorhage      Medication List    TAKE these medications   ALPRAZolam 0.25 MG tablet Commonly known as: XANAX Take 0.25 mg by mouth 2 (two) times daily as needed for anxiety or sleep.   amLODipine 5 MG  tablet Commonly known as: NORVASC Take 5 mg by mouth daily.   aspirin 81 MG EC tablet Take 1 tablet (81 mg total) by mouth daily.   atorvastatin 40 MG tablet Commonly known as: LIPITOR Take 40 mg by mouth daily.   carvedilol 3.125 MG tablet Commonly known as: COREG Take 1 tablet (3.125 mg total) by mouth 2 (two) times daily with a meal. Hold for heart rate less than 60.   cloNIDine 0.1 MG tablet Commonly known as: CATAPRES Take 0.1 mg by mouth 3 (three) times daily.   clopidogrel 75 MG tablet Commonly known as: PLAVIX Take 75 mg by mouth daily.   lisinopril 2.5 MG tablet Commonly known as: ZESTRIL Take 1 tablet (2.5 mg total) by mouth daily. Start taking on: Sep 19, 2019   oxyCODONE-acetaminophen 7.5-325 MG tablet Commonly known as: Percocet Take 1 tablet by mouth every 4 (four) hours as needed for severe pain.   Proventil HFA 108 (90 Base) MCG/ACT inhaler Generic drug: albuterol Inhale 2 puffs into the lungs every 6 (six) hours as needed for wheezing or shortness of breath.      Follow-up Information    Tracie Harrier, MD. Go on 09/25/2019.   Specialty: Internal Medicine Why: Appointment Time: 10:00am  Contact information: [address] [place] [address] [phone]        Corey Skains, MD. Go on 10/03/2019.   Specialty: Cardiology Why: Appointment Time: 3:30pm Contact information: [address] [facility] [place] [address] [phone]          Allergies  Allergen Reactions  . Zyvox [Linezolid] Other (See Comments)    Nerve damage (neuropathy) Retinal hemmorhage    Consultations:  [id number]   Procedures/Studies: DG Chest 2 View  Result Date: 09/17/2019 CLINICAL DATA:  Shortness of breath EXAM: CHEST - 2 VIEW COMPARISON:  08/28/2018 FINDINGS: Borderline heart size. New interstitial opacity with Kerley lines. No pleural effusion or pneumothorax. Stable aortic  tortuosity. IMPRESSION: Interstitial edema. Electronically Signed   By: Monte Fantasia M.D.   On: 09/17/2019 10:37   CT Angio Chest PE W and/or Wo Contrast  Result Date: 09/17/2019 CLINICAL DATA:  Shortness of breath. EXAM: CT ANGIOGRAPHY CHEST WITH CONTRAST TECHNIQUE: Multidetector CT imaging of the chest was performed using the standard protocol during bolus administration of intravenous contrast. Multiplanar CT image reconstructions and MIPs were obtained to evaluate the vascular anatomy. CONTRAST:  65mL OMNIPAQUE IOHEXOL 350 MG/ML SOLN COMPARISON:  None. FINDINGS: Cardiovascular: Satisfactory opacification of the pulmonary arteries to the segmental level. No evidence of pulmonary embolism. Normal heart size. No pericardial effusion. Coronary artery calcifications are noted. 4.3 cm ascending thoracic aortic aneurysm is noted. Mediastinum/Nodes: No enlarged mediastinal, hilar, or axillary lymph nodes. Thyroid gland, trachea, and esophagus demonstrate no significant findings. Lungs/Pleura: No pneumothorax is noted. Mild bilateral posterior basilar subsegmental atelectasis is noted. 4 mm nodule is noted laterally in the right middle lobe best seen on image number 78 of series 6. Upper Abdomen: No acute abnormality. Musculoskeletal: No chest wall abnormality. No acute or significant osseous findings. Review of  the MIP images confirms the above findings. IMPRESSION: 1. No definite evidence of pulmonary embolus. 2. Coronary artery calcifications are noted suggesting coronary artery disease. 3. 4.3 cm ascending thoracic aortic aneurysm is noted. Recommend annual imaging followup by CTA or MRA. This recommendation follows 2010 ACCF/AHA/AATS/ACR/ASA/SCA/SCAI/SIR/STS/SVM Guidelines for the Diagnosis and Management of Patients with Thoracic Aortic Disease. Circulation. 2010; [id number]. Aortic aneurysm NOS (ICD10-I71.9). 4. Mild bilateral posterior basilar subsegmental atelectasis is noted. 5. 4 mm nodule is noted  laterally in the right middle lobe. No follow-up needed if patient is low-risk. Non-contrast chest CT can be considered in 12 months if patient is high-risk. This recommendation follows the consensus statement: Guidelines for Management of Incidental Pulmonary Nodules Detected on CT Images: From the Fleischner Society 2017; Radiology 2017; 284:228-243. Aortic Atherosclerosis (ICD10-I70.0). Electronically Signed   By: Marijo Conception M.D.   On: 09/17/2019 14:34   DG Chest Port 1 View  Result Date: 08/28/2019 CLINICAL DATA:  Low oxygen saturation. Postop hernia surgery. EXAM: PORTABLE CHEST 1 VIEW COMPARISON:  Radiograph 12/28/2018 FINDINGS: Mild hyperinflation. Bronchial and interstitial thickening was present on prior exam, with slight improvement. Normal heart size and mediastinal contours. No pneumothorax or large pleural effusion. No acute osseous abnormalities are seen. IMPRESSION: 1. Mild hyperinflation. 2. Bronchial and interstitial thickening, also present on prior exam. Favor smoking related lung disease. An element of superimposed vascular congestion is difficult to exclude. Electronically Signed   By: Keith Rake M.D.   On: 08/28/2019 17:11    (Echo, Carotid, EGD, Colonoscopy, ERCP)    Subjective: Seen and examined on the day of discharge Respiratory status improved Expresses desire to go home  Discharge Exam: Vitals:   09/18/19 1007 09/18/19 1207  BP: 120/75 112/75  Pulse: 64 71  Resp:  17  Temp:  98.5 F (36.9 C)  SpO2: 93% 94%   Vitals:   09/18/19 1000 09/18/19 1006 09/18/19 1007 09/18/19 1207  BP:   120/75 112/75  Pulse:   64 71  Resp: 10 12  17   Temp:    98.5 F (36.9 C)  TempSrc:    Oral  SpO2:  94% 93% 94%  Weight:      Height:        General: Pt is alert, awake, not in acute distress Cardiovascular: RRR, S1/S2 +, no rubs, no gallops Respiratory: CTA bilaterally, no wheezing, no rhonchi Abdominal: Soft, NT, ND, bowel sounds + Extremities: no edema, no  cyanosis    The results of significant diagnostics from this hospitalization (including imaging, microbiology, ancillary and laboratory) are listed below for reference.     Microbiology: Recent Results (from the past 240 hour(s))  SARS Coronavirus 2 by RT PCR (hospital order, performed in [facility] hospital lab) Nasopharyngeal Nasopharyngeal Swab     Status: None   Collection Time: 09/17/19 11:22 AM   Specimen: Nasopharyngeal Swab  Result Value Ref Range Status   SARS Coronavirus 2 NEGATIVE NEGATIVE Final    Comment: (NOTE) SARS-CoV-2 target nucleic acids are NOT DETECTED. The SARS-CoV-2 RNA is generally detectable in upper and lower respiratory specimens during the acute phase of infection. The lowest concentration of SARS-CoV-2 viral copies this assay can detect is 250 copies / mL. A negative result does not preclude SARS-CoV-2 infection and should not be used as the sole basis for treatment or other patient management decisions.  A negative result may occur with improper specimen collection / handling, submission of specimen other than nasopharyngeal swab, presence of viral mutation(s) within  the areas targeted by this assay, and inadequate number of viral copies (<250 copies / mL). A negative result must be combined with clinical observations, patient history, and epidemiological information. Fact Sheet for Patients:   StrictlyIdeas.no Fact Sheet for Healthcare Providers: BankingDealers.co.za This test is not yet approved or cleared  by the [place] FDA and has been authorized for detection and/or diagnosis of SARS-CoV-2 by FDA under an Emergency Use Authorization (EUA).  This EUA will remain in effect (meaning this test can be used) for the duration of the COVID-19 declaration under Section 564(b)(1) of the Act, 21 U.S.C. section 360bbb-3(b)(1), unless the authorization is terminated or revoked sooner. Performed at  [facility], [address] [place], [place] [address]      Labs: BNP (last 3 results) Recent Labs    12/27/18 0836 12/28/18 0659 09/17/19 1003  BNP 133.0* 2,499.0* 123XX123*   Basic Metabolic Panel: Recent Labs  Lab 09/17/19 1003 09/18/19 0414  NA 137 139  K 3.9 4.3  CL 97* 97*  CO2 28 32  GLUCOSE 226* 127*  BUN 15 18  CREATININE 1.14 1.07  CALCIUM 9.1 8.8*   Liver Function Tests: Recent Labs  Lab 09/17/19 1003  AST 20  ALT 14  ALKPHOS 78  BILITOT 2.2*  PROT 7.6  ALBUMIN 4.2   No results for input(s): LIPASE, AMYLASE in the last 168 hours. No results for input(s): AMMONIA in the last 168 hours. CBC: Recent Labs  Lab 09/17/19 1003  WBC 10.8*  HGB 12.6*  HCT 39.9  MCV 91.5  PLT 225   Cardiac Enzymes: No results for input(s): CKTOTAL, CKMB, CKMBINDEX, TROPONINI in the last 168 hours. BNP: Invalid input(s): POCBNP CBG: No results for input(s): GLUCAP in the last 168 hours. D-Dimer No results for input(s): DDIMER in the last 72 hours. Hgb A1c No results for input(s): HGBA1C in the last 72 hours. Lipid Profile No results for input(s): CHOL, HDL, LDLCALC, TRIG, CHOLHDL, LDLDIRECT in the last 72 hours. Thyroid function studies Recent Labs    09/17/19 1003  TSH 0.579   Anemia work up No results for input(s): VITAMINB12, FOLATE, FERRITIN, TIBC, IRON, RETICCTPCT in the last 72 hours. Urinalysis    Component Value Date/Time   APPEARANCEUR Clear 10/04/2017 0911   GLUCOSEU Negative 10/04/2017 0911   BILIRUBINUR Negative 10/04/2017 0911   PROTEINUR 1+ (A) 10/04/2017 0911   NITRITE Negative 10/04/2017 0911   LEUKOCYTESUR Negative 10/04/2017 0911   Sepsis Labs Invalid input(s): PROCALCITONIN,  WBC,  LACTICIDVEN Microbiology Recent Results (from the past 240 hour(s))  SARS Coronavirus 2 by RT PCR (hospital order, performed in [place] hospital lab) Nasopharyngeal Nasopharyngeal Swab     Status: None   Collection Time: 09/17/19 11:22 AM    Specimen: Nasopharyngeal Swab  Result Value Ref Range Status   SARS Coronavirus 2 NEGATIVE NEGATIVE Final    Comment: (NOTE) SARS-CoV-2 target nucleic acids are NOT DETECTED. The SARS-CoV-2 RNA is generally detectable in upper and lower respiratory specimens during the acute phase of infection. The lowest concentration of SARS-CoV-2 viral copies this assay can detect is 250 copies / mL. A negative result does not preclude SARS-CoV-2 infection and should not be used as the sole basis for treatment or other patient management decisions.  A negative result may occur with improper specimen collection / handling, submission of specimen other than nasopharyngeal swab, presence of viral mutation(s) within the areas targeted by this assay, and inadequate number of viral copies (<250 copies / mL).  A negative result must be combined with clinical observations, patient history, and epidemiological information. Fact Sheet for Patients:   StrictlyIdeas.no Fact Sheet for Healthcare Providers: BankingDealers.co.za This test is not yet approved or cleared  by the [place] FDA and has been authorized for detection and/or diagnosis of SARS-CoV-2 by FDA under an Emergency Use Authorization (EUA).  This EUA will remain in effect (meaning this test can be used) for the duration of the COVID-19 declaration under Section 564(b)(1) of the Act, 21 U.S.C. section 360bbb-3(b)(1), unless the authorization is terminated or revoked sooner. Performed at [facility], [address] [place], [place] [address]      Time coordinating discharge: Over 30 minutes  SIGNED:   Sidney Ace, MD  [facility] 09/18/2019, 3:23 PM Pager   If 7PM-7AM, please contact night-coverage

## 2019-09-18 NOTE — Discharge Instructions (Signed)
Pulmonary Edema Pulmonary edema is a condition in which fluid collects in the air sacs of the lung. This makes it hard for the lungs to fill with air. It also prevents the lungs from moving oxygen into the bloodstream, which can affect other organs, such as the brain and kidneys. Pulmonary edema is an emergency and should be treated immediately. There are two main types of pulmonary edema:  Cardiogenic. This means the pulmonary edema was caused by a problem with the heart.  Noncardiogenic. This means the pulmonary edema was caused by something other than the heart, such as an injury to the lung. What are the causes? This condition is commonly caused by heart failure. When this happens, the heart is not able to properly pump blood through the body. This can lead to increased pressure in the heart and blood building up in the veins around the lungs. When blood builds up in these veins, fluid gets pushed into the air sacs of the lung. Heart failure may be caused by:  Coronary artery disease.  High blood pressure.  Viral infection of the heart (myocarditis).  Leaky or stiff heart valves.  Irregular heartbeat (arrhythmia).  Fluid buildup caused by kidney problems. Other causes include:  Infection in the lungs (pneumonia), blood (sepsis), or other part of the body.  Severe injury to the chest.  Lung injury from heat or toxins, such as breathing in smoke or poisonous gas.  Inhaling vomit or water (pulmonaryaspiration).  Certain medicines.  High altitude. What are the signs or symptoms? Symptoms of this condition include:  Shortness of breath.  Coughing with frothy or bloody mucus.  Wheezing.  Feeling like you cannot get enough air.  Shallow and fast breathing.  Skin that is cool and damp, and has a pale or bluish color. How is this diagnosed? This condition is diagnosed based on:  Your medical history.  A physical exam.  Your symptoms. You may also have other tests,  including:  Chest X-ray.  Chest CT scan.  Blood tests, including checking the amount of oxygen in the blood.  Sputum culture. This test checks for infection in the mucus that you cough up from your lungs.  Electrocardiogram. This measures the electrical signals of the heart.  Echocardiogram. This uses an ultrasound to evaluate the health of the heart. How is this treated? Initial treatment for this condition focuses on relieving your symptoms. Treatment depends on the underlying cause of the condition. This may include:  Oxygen therapy. The oxygen may be given through tubes in your nose or through a face mask. In severe cases, a breathing tube is inserted into the windpipe and hooked up to a breathing machine (ventilator).  Medicines. These may include medicines to: ? Help the body get rid of extra water (diuretics). ? Help the heart pump blood properly. ? Prevent or destroy blood clots. If poor heart function is the cause, treatment may also include:  Procedures to open blocked arteries, repair damaged heart valves, or remove some of the damaged heart muscle.  A pacemaker to help with heart function.  A procedure that uses electric shocks to regulate heart rate (cardioversion). If an infection is the cause, treatment may include antibiotic medicines. Follow these instructions at home: Medicines  Take over-the-counter and prescription medicines only as told by your health care provider.  If you were prescribed an antibiotic, take it as told by your health care provider. Do not stop taking the antibiotic even if you start to feel better.  Have a plan with information about each medicine you take. This should include: ? Why you take the medicine. ? Possible side effects. ? Best time of day to take it. ? Foods to take with it, or foods to avoid when taking it. ? When to call your health care provider.  Make a list of each medicine, vitamin, or herbal supplement you take. Keep  the list with you at all times. Show it to your health care provider at each visit and before starting a new medicine. Update the list as you add or stop medicines. Lifestyle   Exercise regularly as told by your health care provider. It is important to do it safely. You can do this by: ? Pacing your activities to avoid shortness of breath or chest pain. ? Resting for at least 1 hour before and after meals. ? Asking about cardiac rehabilitation programs. These may include education, exercise plans, and counseling.  Eat a heart-healthy diet that is low in salt (sodium), saturated fat, and cholesterol. Your health care provider may recommend foods that are high in fiber, such as fresh fruits and vegetables, whole grains, and beans.  Do not use any products that contain nicotine or tobacco, such as cigarettes and e-cigarettes. If you need help quitting, ask your health care provider. General instructions  Maintain a healthy weight.  Keep a record of your weight: ? Record your hospital or clinic weight. When you get home, compare it to your scale and record your weight. ? Weigh yourself first thing each morning after you urinate and before you eat breakfast. Wear the same amount of clothing each time. Record the weights. ? Share your weight record with your health care provider. Daily weights are important in detecting the body's retention of excess fluid. ? Tell your health care provider right away if you gain weight quickly. Your medicines may need to be adjusted.  Check and record your blood pressure as often as told by your health care provider. Bring the records with you to clinic visits.  Consider therapy or joining a support group. This may help with any stress, fear, or anxiety.  Keep all follow-up visits as told by your health care provider. This is important. Get help right away if:  You gain weight quickly.  You have severe chest pain, especially if the pain is crushing or  pressure-like and spreads to the arms, back, neck, or jaw.  You have more swelling in your hands, feet, ankles, or abdomen.  You have nausea.  You have unusual sweating or your skin turns blue or pale.  Your shortness of breath gets worse.  You have dizziness, blurred vision, a headache, or unsteadiness.  Your blood pressure is higher than 180/120.  You cough up bloody mucus (sputum).  You cannot sleep because it is hard to breathe.  You feel a racing heart beat (palpitations).  You have anxiety or a feeling that you cannot get enough air. These symptoms may represent a serious problem that is an emergency. Do not wait to see if the symptoms will go away. Get medical help right away. Call your local emergency services (911 in the [place] Do not drive yourself to the hospital. Summary  Pulmonary edema is a condition in which fluid collects in the air sacs of your lungs. If left untreated, it can lead to a medical emergency.  This condition is most commonly caused by heart failure. Other causes can include infections or injury to the lungs.  Take over-the-counter  and prescription medicines only as told by your health care provider. This information is not intended to replace advice given to you by your health care provider. Make sure you discuss any questions you have with your health care provider. Document Revised: 03/31/2017 Document Reviewed: 06/29/2016 Elsevier Patient Education  2020 [employer]  

## 2019-09-18 NOTE — Consult Note (Signed)
[facility] Cardiology Consultation Note  Patient ID: Theodore Obrien, MRN: [id number], DOB/AGE: 1950-06-17 69 y.o. Admit date: 09/17/2019   Date of Consult: 09/18/2019 Primary Physician: Tracie Harrier, MD Primary Cardiologist: Nehemiah Massed  Chief Complaint:  Chief Complaint  Patient presents with  . Respiratory Distress   Reason for Consult: Elevated troponin with heart failure  HPI: 69 y.o. male with known previous tobacco abuse hypertension hyperlipidemia and coronary artery disease status post non-ST elevation myocardial infarction in 2020 with a PCI and stent placement of left anterior descending artery without complication and residual stenosis of the left circumflex artery of 60%.  Patient was placed on appropriate medications for the last 6 months or more with a stress test showing no evidence of ischemia in the distribution of the posterior wall and circumflex artery.  The patient underwent surgical intervention of a hernia without complication.  The patient has had significant increase issues with shortness of breath PND and orthopnea and was seen in the emergency room.  At that time he has had a chest x-ray showing pulmonary edema and is interstitial edema BNP of 676 a troponin of 297 with no evidence of peak or evidence of acute coronary syndrome but most consistent with demand ischemia.  EKG shows sinus tachycardia with left axis deviation and right bundle branch block not significantly different from EKG 6 to 8 months prior.  Currently he is received intravenous Lasix with improvements of pulmonary edema but still has some mild shortness of breath and need for oxygen.  He currently is hemodynamically stable  Past Medical History:  Diagnosis Date  . Dyspnea   . GERD (gastroesophageal reflux disease)   . History of kidney stones   . Hypertension   . Myocardial infarction [facility]) 2020      Surgical History:  Past Surgical History:  Procedure Laterality Date  . CARPAL TUNNEL  RELEASE Left   . CORONARY STENT INTERVENTION N/A 12/31/2018   Procedure: CORONARY STENT INTERVENTION;  Surgeon: Yolonda Kida, MD;  Location: [place] [facility];  Service: Cardiovascular;  Laterality: N/A;  . FEMORAL HERNIA REPAIR Left 10/27/2014   Procedure: Repair of left femoral and direct hernia with mesh ;  Surgeon: Sherri Rad, MD;  Location: [facility];  Service: General;  Laterality: Left;  . FRACTURE SURGERY     right leg  . LEFT HEART CATH AND CORONARY ANGIOGRAPHY N/A 12/31/2018   Procedure: LEFT HEART CATH AND CORONARY ANGIOGRAPHY;  Surgeon: Corey Skains, MD;  Location: [place] [facility];  Service: Cardiovascular;  Laterality: N/A;  . XI ROBOTIC ASSISTED INGUINAL HERNIA REPAIR WITH MESH Bilateral 08/28/2019   Procedure: XI ROBOTIC ASSISTED INGUINAL HERNIA REPAIR WITH MESH;  Surgeon: Herbert Pun, MD;  Location: [facility];  Service: General;  Laterality: Bilateral;     Home Meds: Prior to Admission medications   Medication Sig Start Date End Date Taking? Authorizing Provider  albuterol (PROVENTIL HFA) 108 (90 Base) MCG/ACT inhaler Inhale 2 puffs into the lungs every 6 (six) hours as needed for wheezing or shortness of breath.    Yes [provider]  ALPRAZolam (XANAX) 0.25 MG tablet Take 0.25 mg by mouth 2 (two) times daily as needed for anxiety or sleep.  08/03/17  Yes [provider]  amLODipine (NORVASC) 5 MG tablet Take 5 mg by mouth daily.    Yes [provider]  aspirin EC 81 MG EC tablet Take 1 tablet (81 mg total) by mouth daily. 01/02/19  Yes Otila Back, MD  atorvastatin (LIPITOR) 40 MG tablet Take 40 mg by mouth daily.   Yes [provider]  carvedilol (COREG) 3.125 MG tablet Take 1 tablet (3.125 mg total) by mouth 2 (two) times daily with a meal. Hold for heart rate less than 60. 01/01/19  Yes Ojie, Jude, MD  cloNIDine (CATAPRES) 0.1 MG tablet Take 0.1 mg by mouth 3 (three) times daily.  06/19/17  Yes [provider]   clopidogrel (PLAVIX) 75 MG tablet Take 75 mg by mouth daily.   Yes [provider]  oxyCODONE-acetaminophen (PERCOCET) 7.5-325 MG tablet Take 1 tablet by mouth every 4 (four) hours as needed for severe pain. 08/29/19 08/28/20 Yes Herbert Pun, MD    Inpatient Medications:  . amLODipine  2.5 mg Oral Daily  . aspirin EC  81 mg Oral Daily  . atorvastatin  40 mg Oral Daily  . carvedilol  3.125 mg Oral BID WC  . cloNIDine  0.1 mg Oral TID  . clopidogrel  75 mg Oral Daily  . enoxaparin (LOVENOX) injection  40 mg Subcutaneous Q24H  . furosemide  20 mg Intravenous BID  . lisinopril  2.5 mg Oral Daily  . sodium chloride flush  3 mL Intravenous Q12H   . sodium chloride      Allergies:  Allergies  Allergen Reactions  . Zyvox [Linezolid] Other (See Comments)    Nerve damage (neuropathy) Retinal hemmorhage    Social History   Socioeconomic History  . Marital status: Legally Separated    Spouse name: Not on file  . Number of children: Not on file  . Years of education: Not on file  . Highest education level: Not on file  Occupational History  . Not on file  Tobacco Use  . Smoking status: Former Smoker    Packs/day: 1.00    Years: 6.00    Pack years: 6.00    Types: Cigarettes    Quit date: 2018    Years since quitting: 3.3  . Smokeless tobacco: Never Used  . Tobacco comment: quit smoking 2018  Substance and Sexual Activity  . Alcohol use: Yes    Comment: occasionally beer  . Drug use: No  . Sexual activity: Yes  Other Topics Concern  . Not on file  Social History Narrative  . Not on file   Social Determinants of Health   Financial Resource Strain:   . Difficulty of Paying Living Expenses:   Food Insecurity:   . Worried About Charity fundraiser in the Last Year:   . Arboriculturist in the Last Year:   Transportation Needs:   . Film/video editor (Medical):   Marland Kitchen Lack of Transportation (Non-Medical):   Physical Activity:   . Days of Exercise per  Week:   . Minutes of Exercise per Session:   Stress:   . Feeling of Stress :   Social Connections:   . Frequency of Communication with Friends and Family:   . Frequency of Social Gatherings with Friends and Family:   . Attends Religious Services:   . Active Member of Clubs or Organizations:   . Attends Archivist Meetings:   Marland Kitchen Marital Status:   Intimate Partner Violence:   . Fear of Current or Ex-Partner:   . Emotionally Abused:   Marland Kitchen Physically Abused:   . Sexually Abused:      Family History  Problem Relation Age of Onset  . Hypertension Mother   . Heart disease Mother   . Prostate cancer  Father   . Leukemia Father      Review of Systems Positive for shortness of breath Negative for: General:  chills, fever, night sweats or weight changes.  Cardiovascular: PND orthopnea syncope dizziness  Dermatological skin lesions rashes Respiratory: Cough congestion Urologic: Frequent urination urination at night and hematuria Abdominal: negative for nausea, vomiting, diarrhea, bright red blood per rectum, melena, or hematemesis Neurologic: negative for visual changes, and/or hearing changes  All other systems reviewed and are otherwise negative except as noted above.  Labs: No results for input(s): CKTOTAL, CKMB, TROPONINI in the last 72 hours. Lab Results  Component Value Date   WBC 10.8 (H) 09/17/2019   HGB 12.6 (L) 09/17/2019   HCT 39.9 09/17/2019   MCV 91.5 09/17/2019   PLT 225 09/17/2019    Recent Labs  Lab 09/17/19 1003 09/17/19 1003 09/18/19 0414  NA 137   < > 139  K 3.9   < > 4.3  CL 97*   < > 97*  CO2 28   < > 32  BUN 15   < > 18  CREATININE 1.14   < > 1.07  CALCIUM 9.1   < > 8.8*  PROT 7.6  --   --   BILITOT 2.2*  --   --   ALKPHOS 78  --   --   ALT 14  --   --   AST 20  --   --   GLUCOSE 226*   < > 127*   < > = values in this interval not displayed.   No results found for: CHOL, HDL, LDLCALC, TRIG No results found for:  DDIMER  Radiology/Studies:  DG Chest 2 View  Result Date: 09/17/2019 CLINICAL DATA:  Shortness of breath EXAM: CHEST - 2 VIEW COMPARISON:  08/28/2018 FINDINGS: Borderline heart size. New interstitial opacity with Kerley lines. No pleural effusion or pneumothorax. Stable aortic tortuosity. IMPRESSION: Interstitial edema. Electronically Signed   By: Monte Fantasia M.D.   On: 09/17/2019 10:37   CT Angio Chest PE W and/or Wo Contrast  Result Date: 09/17/2019 CLINICAL DATA:  Shortness of breath. EXAM: CT ANGIOGRAPHY CHEST WITH CONTRAST TECHNIQUE: Multidetector CT imaging of the chest was performed using the standard protocol during bolus administration of intravenous contrast. Multiplanar CT image reconstructions and MIPs were obtained to evaluate the vascular anatomy. CONTRAST:  45mL OMNIPAQUE IOHEXOL 350 MG/ML SOLN COMPARISON:  None. FINDINGS: Cardiovascular: Satisfactory opacification of the pulmonary arteries to the segmental level. No evidence of pulmonary embolism. Normal heart size. No pericardial effusion. Coronary artery calcifications are noted. 4.3 cm ascending thoracic aortic aneurysm is noted. Mediastinum/Nodes: No enlarged mediastinal, hilar, or axillary lymph nodes. Thyroid gland, trachea, and esophagus demonstrate no significant findings. Lungs/Pleura: No pneumothorax is noted. Mild bilateral posterior basilar subsegmental atelectasis is noted. 4 mm nodule is noted laterally in the right middle lobe best seen on image number 78 of series 6. Upper Abdomen: No acute abnormality. Musculoskeletal: No chest wall abnormality. No acute or significant osseous findings. Review of the MIP images confirms the above findings. IMPRESSION: 1. No definite evidence of pulmonary embolus. 2. Coronary artery calcifications are noted suggesting coronary artery disease. 3. 4.3 cm ascending thoracic aortic aneurysm is noted. Recommend annual imaging followup by CTA or MRA. This recommendation follows 2010  ACCF/AHA/AATS/ACR/ASA/SCA/SCAI/SIR/STS/SVM Guidelines for the Diagnosis and Management of Patients with Thoracic Aortic Disease. Circulation. 2010; [id number]. Aortic aneurysm NOS (ICD10-I71.9). 4. Mild bilateral posterior basilar subsegmental atelectasis is noted. 5. 4 mm nodule is noted  laterally in the right middle lobe. No follow-up needed if patient is low-risk. Non-contrast chest CT can be considered in 12 months if patient is high-risk. This recommendation follows the consensus statement: Guidelines for Management of Incidental Pulmonary Nodules Detected on CT Images: From the Fleischner Society 2017; Radiology 2017; 284:228-243. Aortic Atherosclerosis (ICD10-I70.0). Electronically Signed   By: Marijo Conception M.D.   On: 09/17/2019 14:34   DG Chest Port 1 View  Result Date: 08/28/2019 CLINICAL DATA:  Low oxygen saturation. Postop hernia surgery. EXAM: PORTABLE CHEST 1 VIEW COMPARISON:  Radiograph 12/28/2018 FINDINGS: Mild hyperinflation. Bronchial and interstitial thickening was present on prior exam, with slight improvement. Normal heart size and mediastinal contours. No pneumothorax or large pleural effusion. No acute osseous abnormalities are seen. IMPRESSION: 1. Mild hyperinflation. 2. Bronchial and interstitial thickening, also present on prior exam. Favor smoking related lung disease. An element of superimposed vascular congestion is difficult to exclude. Electronically Signed   By: Keith Rake M.D.   On: 08/28/2019 17:11    EKG: Sinus tachycardia with left anterior fascicular block unchanged from before  Weights: Filed Weights   09/17/19 0959 09/17/19 2126 09/18/19 0453  Weight: 79.4 kg 78.3 kg 77.7 kg     Physical Exam: Blood pressure 116/81, pulse 62, temperature 98.3 F (36.8 C), temperature source Oral, resp. rate 17, height 6\' 1"  (1.854 m), weight 77.7 kg, SpO2 98 %. Body mass index is 22.59 kg/m. General: Well developed, well nourished, in no acute distress. Head  eyes ears nose throat: Normocephalic, atraumatic, sclera non-icteric, no xanthomas, nares are without discharge. No apparent thyromegaly and/or mass  Lungs: Normal respiratory effort.  Few wheezes, basilar rales, no rhonchi.  Heart: RRR with normal S1 S2. no murmur gallop, no rub, PMI is normal size and placement, carotid upstroke normal without bruit, jugular venous pressure is normal Abdomen: Soft, non-tender, non-distended with normoactive bowel sounds. No hepatomegaly. No rebound/guarding. No obvious abdominal masses. Abdominal aorta is normal size without bruit Extremities: Trace edema. no cyanosis, no clubbing, no ulcers  Peripheral : 2+ bilateral upper extremity pulses, 2+ bilateral femoral pulses, 2+ bilateral dorsal pedal pulse Neuro: Alert and oriented. No facial asymmetry. No focal deficit. Moves all extremities spontaneously. Musculoskeletal: Normal muscle tone without kyphosis Psych:  Responds to questions appropriately with a normal affect.    Assessment: 69 year old male with known coronary artery disease hypertension hyperlipidemia with a tobacco use and new onset acute systolic dysfunction congestive heart failure due to previous anterior myocardial infarction with elevated troponin consistent with demand ischemia and improvements with intravenous Lasix  Plan: 1.  Continue intravenous Lasix for continued acute systolic dysfunction congestive heart failure and pulmonary edema as well as hypoxia 2.  Continue carvedilol lisinopril combination for cardiomyopathy 3.  Clonidine amlodipine for hypertension control without change 4.  Further consideration of outpatient treatment with Entresto and or Farxiga for LV systolic dysfunction and heart failure 5.  High intensity cholesterol therapy 6.  Dual antiplatelet therapy for 6 more months after previous myocardial infarction 7.  Begin ambulation and follow for improvements of symptoms and possible discharge home when pulmonary edema  oxygen requirements improved with further adjustments of medications as an outpatient 8.  No further cardiac diagnostics necessary at this time  Signed, Corey Skains M.D. [facility] Card[facility] 09/18/2019, 8:51 AM

## 2019-09-18 NOTE — Progress Notes (Signed)
Initial Nutrition Assessment  DOCUMENTATION CODES:   Non-severe (moderate) malnutrition in context of chronic illness  INTERVENTION:   Ensure Enlive po BID, each supplement provides 350 kcal and 20 grams of protein  MVI daily   Low sodium diet education   NUTRITION DIAGNOSIS:   Moderate Malnutrition related to chronic illness(CHF, multiple recent hernia surgeries) as evidenced by mild fat depletion, moderate muscle depletion.  GOAL:   Patient will meet greater than or equal to 90% of their needs  MONITOR:   PO intake, Supplement acceptance, Labs, Weight trends, Skin, I & O's  REASON FOR ASSESSMENT:   Consult Diet education  ASSESSMENT:   69 y.o. male with known previous tobacco abuse, hypertension, hyperlipidemia, opioid dependence, GERD, recent bilateral inguinal hernia repair 4/28, CHF and coronary artery disease status post non-ST elevation myocardial infarction in 2020 with a PCI and stent placement who is admitted with pulomary edema  Met with pt in room today. Pt reports decreased appetite and oral intake pta r/t recent bilateral inguinal hernia repair where he reports he had complications including a lot of swelling and bruising. Pt reports that he was not hungry at breakfast this morning; pt reports that he does not always feel like eating breakfast at baseline. Pt has never tried any supplements before but reports that he is willing to try chocolate Ensure in hospital. Per chart, pt is down 12lbs(7%) over the past 2 months; this is not significant. RD will add supplements and MVI to help pt meet his estimated needs.   RD provided "Low Sodium Nutrition Therapy" handout from the Academy of Nutrition and Dietetics. Reviewed patient's dietary recall. Provided examples on ways to decrease sodium intake in diet. Discouraged intake of processed foods and use of salt shaker. Encouraged fresh fruits and vegetables as well as whole grain sources of carbohydrates to maximize fiber  intake.   RD discussed why it is important for patient to adhere to diet recommendations, and emphasized the role of fluids, foods to avoid, and importance of weighing self daily. Teach back method used.  Medications reviewed and include: aspirin, plavix, lasix, lovenox   Labs reviewed: BNP 676(H)- 5/18 Wbc- 10.8(H)  NUTRITION - FOCUSED PHYSICAL EXAM:    Most Recent Value  Orbital Region  Mild depletion  Upper Arm Region  Moderate depletion  Thoracic and Lumbar Region  Mild depletion  Buccal Region  Mild depletion  Temple Region  Mild depletion  Clavicle Bone Region  Mild depletion  Clavicle and Acromion Bone Region  Mild depletion  Scapular Bone Region  Moderate depletion  Dorsal Hand  Moderate depletion  Patellar Region  Moderate depletion  Anterior Thigh Region  Moderate depletion  Posterior Calf Region  Moderate depletion  Edema (RD Assessment)  Mild  Hair  Reviewed  Eyes  Reviewed  Mouth  Reviewed  Skin  Reviewed  Nails  Reviewed     Diet Order:   Diet Order            Diet Heart Room service appropriate? Yes; Fluid consistency: Thin  Diet effective now             EDUCATION NEEDS:   Education needs have been addressed  Skin:  Skin Assessment: Reviewed RN Assessment(ecchymosis)  Last BM:  5/18  Height:   Ht Readings from Last 1 Encounters:  09/17/19 6' 1" (1.854 m)    Weight:   Wt Readings from Last 1 Encounters:  09/18/19 77.7 kg    Ideal Body Weight:  83.6 kg    BMI:  Body mass index is 22.59 kg/m.  Estimated Nutritional Needs:   Kcal:  2100-2400kcal/day  Protein:  105-120g/day  Fluid:  2L/day  Casey Campbell MS, RD, LDN Please refer to AMION for RD and/or RD on-call/weekend/after hours pager  

## 2019-09-18 NOTE — Progress Notes (Signed)
Patient given discharge instructions. IV taken out and tele monitor off. Patient verbalized understanding without any questions or concerns. Family member picking up patient. Ride is here and MD notified of potential code 33.

## 2019-09-18 NOTE — Progress Notes (Signed)
*  PRELIMINARY RESULTS* Echocardiogram 2D Echocardiogram has been performed.  Theodore Obrien 09/18/2019, 12:08 PM

## 2019-09-18 NOTE — Clinical Social Work Note (Signed)
CSW was notified by RN that pt was potential Code 44. However, at this time pt is still inpatient. UR has not notified CSW that orders and signatures have been completed so CSW can not provide the letter. Pt has been d/c and pt's ride is here. Pt was wheeled down by tech. MD was notified.   [place], [place]

## 2019-09-20 ENCOUNTER — Telehealth: Payer: Self-pay | Admitting: Family

## 2019-09-20 NOTE — Telephone Encounter (Signed)
Patient not sure why he is coming to the [facility] but he did confirm the new patient appointment for 5/27. He has no complaints or symtpoms at this time but otherwise has not done anything else as he Is suppose to related to Heart failure as he was not aware of it.    Alyse Low, [place]

## 2019-09-25 NOTE — Progress Notes (Signed)
Patient ID: Theodore Obrien, male    DOB: 04-27-1951, 69 y.o.   MRN: [id number]  HPI  Mr Bogdon is a 69 y/o male with a history of HTN, GERD, MI, previous tobacco use and chronic heart failure.   Echo report from 09/18/19 reviewed and showed an EF of 40-45% along with mild MR.   Catheterization and stent done 12/31/2018:  Prox LAD lesion is 90% stenosed.  Prox Cx to Mid Cx lesion is 60% stenosed.  Prox RCA lesion is 20% stenosed with 20% stenosed side branch in RV Branch.  A drug-eluting stent was successfully placed using a STENT RESOLUTE ONYX 3.5X15.  Post intervention, there is a 0% residual stenosis. Conclusion Successful PCI and stent with DES to proximal LAD from 95 down to 0% 3.5 x 15 mm DES stent resolute Onyx to 13 atm TIMI-3 flow before and after  Admitted 09/17/19 due to flash pulmonary edema. Cardiology consult obtained. Initially given IV lasix and then transitioned to oral diuretics. Weaned off of oxygen. REDS vest showed a low reading. Elevated troponin thought to be due to demand ischemia. Discharged the following day. Had lap assisted bilateral inguinal hernia repaired on 08/28/19 but had to be kept overnight due to HTN and inability to be weaned off oxygen. Discharged the following day.    He presents today with a chief complaint of an initial visit. He currently doesn't have any symptoms and specifically denies any difficulty sleeping, dizziness, abdominal distention, palpitations, pedal edema, chest pain, wheezing, shortness of breath, cough, fatigue or weight gain.   Past Medical History:  Diagnosis Date  . CHF (congestive heart failure) ([place])   . Dyspnea   . GERD (gastroesophageal reflux disease)   . History of kidney stones   . Hypertension   . Myocardial infarction [facility]) 2020   Past Surgical History:  Procedure Laterality Date  . CARPAL TUNNEL RELEASE Left   . CORONARY STENT INTERVENTION N/A 12/31/2018   Procedure: CORONARY STENT INTERVENTION;  Surgeon:  Yolonda Kida, MD;  Location: [place] [facility];  Service: Cardiovascular;  Laterality: N/A;  . FEMORAL HERNIA REPAIR Left 10/27/2014   Procedure: Repair of left femoral and direct hernia with mesh ;  Surgeon: Sherri Rad, MD;  Location: [facility];  Service: General;  Laterality: Left;  . FRACTURE SURGERY     right leg  . LEFT HEART CATH AND CORONARY ANGIOGRAPHY N/A 12/31/2018   Procedure: LEFT HEART CATH AND CORONARY ANGIOGRAPHY;  Surgeon: Corey Skains, MD;  Location: [place] [facility];  Service: Cardiovascular;  Laterality: N/A;  . XI ROBOTIC ASSISTED INGUINAL HERNIA REPAIR WITH MESH Bilateral 08/28/2019   Procedure: XI ROBOTIC ASSISTED INGUINAL HERNIA REPAIR WITH MESH;  Surgeon: Herbert Pun, MD;  Location: [facility];  Service: General;  Laterality: Bilateral;   Family History  Problem Relation Age of Onset  . Hypertension Mother   . Heart disease Mother   . Prostate cancer Father   . Leukemia Father    Social History   Tobacco Use  . Smoking status: Former Smoker    Packs/day: 1.00    Years: 6.00    Pack years: 6.00    Types: Cigarettes    Quit date: 2018    Years since quitting: 3.4  . Smokeless tobacco: Never Used  . Tobacco comment: quit smoking 2018  Substance Use Topics  . Alcohol use: Yes    Comment: occasionally beer   Allergies  Allergen Reactions  . Zyvox [Linezolid] Other (See Comments)  Nerve damage (neuropathy) Retinal hemmorhage   Prior to Admission medications   Medication Sig Start Date End Date Taking? Authorizing Provider  albuterol (PROVENTIL HFA) 108 (90 Base) MCG/ACT inhaler Inhale 2 puffs into the lungs every 6 (six) hours as needed for wheezing or shortness of breath.    Yes [provider]  ALPRAZolam (XANAX) 0.25 MG tablet Take 0.25 mg by mouth 2 (two) times daily as needed for anxiety or sleep.  08/03/17  Yes [provider]  amLODipine (NORVASC) 5 MG tablet Take 5 mg by mouth daily.    Yes [provider]  aspirin EC 81 MG EC tablet Take 1 tablet (81 mg total) by mouth daily. 01/02/19  Yes Ojie, Jude, MD  atorvastatin (LIPITOR) 40 MG tablet Take 40 mg by mouth daily.   Yes [provider]  carvedilol (COREG) 3.125 MG tablet Take 1 tablet (3.125 mg total) by mouth 2 (two) times daily with a meal. Hold for heart rate less than 60. 01/01/19  Yes Ojie, Jude, MD  cloNIDine (CATAPRES) 0.1 MG tablet Take 0.1 mg by mouth 3 (three) times daily.  06/19/17  Yes [provider]  clopidogrel (PLAVIX) 75 MG tablet Take 75 mg by mouth daily.   Yes [provider]  lisinopril (ZESTRIL) 2.5 MG tablet Take 1 tablet (2.5 mg total) by mouth daily. 09/19/19 10/19/19 Yes Sreenath, Sudheer B, MD  oxyCODONE-acetaminophen (PERCOCET) 7.5-325 MG tablet Take 1 tablet by mouth every 4 (four) hours as needed for severe pain. Patient not taking: Reported on 09/26/2019 08/29/19 08/28/20  Herbert Pun, MD    Review of Systems  Constitutional: Negative for appetite change and fatigue.  HENT: Negative for congestion, postnasal drip and sore throat.   Eyes: Negative.   Respiratory: Negative for cough, shortness of breath and wheezing.   Cardiovascular: Negative for chest pain, palpitations and leg swelling.  Gastrointestinal: Negative for abdominal distention and abdominal pain.  Endocrine: Negative.   Genitourinary: Negative.   Musculoskeletal: Negative for back pain and neck pain.  Skin: Negative.   Allergic/Immunologic: Negative.   Neurological: Negative for dizziness and light-headedness.  Hematological: Negative for adenopathy. Does not bruise/bleed easily.  Psychiatric/Behavioral: Negative for dysphoric mood and sleep disturbance (sleeping on 1 pillow). The patient is not nervous/anxious.     Vitals:   09/26/19 1102  BP: 112/69  Pulse: (!) 54  Resp: 20  SpO2: 94%  Weight: 172 lb 6 oz (78.2 kg)  Height: '6\' 1"'  (1.854 m)   Wt Readings from Last 3 Encounters:  09/26/19 172  lb 6 oz (78.2 kg)  09/18/19 171 lb 3.2 oz (77.7 kg)  08/28/19 178 lb 9.2 oz (81 kg)   Lab Results  Component Value Date   CREATININE 1.07 09/18/2019   CREATININE 1.14 09/17/2019   CREATININE 1.23 08/28/2019    Physical Exam Vitals and nursing note reviewed.  Constitutional:      Appearance: Normal appearance.  HENT:     Head: Normocephalic and atraumatic.  Cardiovascular:     Rate and Rhythm: Regular rhythm. Bradycardia present.  Pulmonary:     Effort: Pulmonary effort is normal. No respiratory distress.     Breath sounds: No wheezing or rales.  Abdominal:     General: There is no distension.     Palpations: Abdomen is soft.  Musculoskeletal:        General: No tenderness.     Cervical back: Normal range of motion and neck supple.     Right lower leg:  No edema.     Left lower leg: No edema.  Skin:    General: Skin is warm and dry.  Neurological:     General: No focal deficit present.     Mental Status: He is alert and oriented to person, place, and time.  Psychiatric:        Mood and Affect: Mood normal.        Behavior: Behavior normal.        Thought Content: Thought content normal.    Assessment & Plan:  1: Chronic heart failure with reduced ejection fraction- - NYHA class I - euvolemic today - weighing daily and he was reminded to call for an overnight weight gain of >2 pounds or a weekly weight gain of >5 pounds - not adding salt; reviewed the importance of following a low sodium diet and written dietary and low sodium cookbook were given to the patient - saw cardiology Nehemiah Massed) 08/14/19 - doubt current BP could tolerate changing his lisinopril to entresto - bradycardic so unable to titrate carvedilol - BNP 09/17/19 was 676.0 - PharmD reconciled medications with the patient  2: HTN- - BP looks good today (112/69) - consider weaning off clonidine and titrating up other medications.  - saw PCP (Hande) 08/13/19 - BMP 09/18/19 reviewed and showed sodium 139,  potassium 4.3, creatinine 1.07 and GFR >60   Patient did not bring his medications nor a list. Each medication was verbally reviewed with the patient and he was encouraged to bring the bottles to every visit to confirm accuracy of list.  Due to HF stability, patient opts to not make a return appointment at this time. Advised patient that he could call back at anytime to schedule another appointment. Patient was comfortable with this plan.

## 2019-09-26 ENCOUNTER — Ambulatory Visit: Payer: Medicare Other | Attending: Family | Admitting: Family

## 2019-09-26 ENCOUNTER — Encounter: Payer: Self-pay | Admitting: Family

## 2019-09-26 VITALS — BP 112/69 | HR 54 | Resp 20 | Ht 73.0 in | Wt 172.4 lb

## 2019-09-26 DIAGNOSIS — I252 Old myocardial infarction: Secondary | ICD-10-CM | POA: Diagnosis not present

## 2019-09-26 DIAGNOSIS — Z8249 Family history of ischemic heart disease and other diseases of the circulatory system: Secondary | ICD-10-CM | POA: Insufficient documentation

## 2019-09-26 DIAGNOSIS — Z79899 Other long term (current) drug therapy: Secondary | ICD-10-CM | POA: Diagnosis not present

## 2019-09-26 DIAGNOSIS — Z955 Presence of coronary angioplasty implant and graft: Secondary | ICD-10-CM | POA: Insufficient documentation

## 2019-09-26 DIAGNOSIS — I110 Hypertensive heart disease with heart failure: Secondary | ICD-10-CM | POA: Diagnosis not present

## 2019-09-26 DIAGNOSIS — Z7901 Long term (current) use of anticoagulants: Secondary | ICD-10-CM | POA: Diagnosis not present

## 2019-09-26 DIAGNOSIS — I5022 Chronic systolic (congestive) heart failure: Secondary | ICD-10-CM

## 2019-09-26 DIAGNOSIS — Z87891 Personal history of nicotine dependence: Secondary | ICD-10-CM | POA: Insufficient documentation

## 2019-09-26 DIAGNOSIS — Z7982 Long term (current) use of aspirin: Secondary | ICD-10-CM | POA: Insufficient documentation

## 2019-09-26 DIAGNOSIS — I10 Essential (primary) hypertension: Secondary | ICD-10-CM

## 2019-09-26 NOTE — Progress Notes (Signed)
Theodore Obrien - PHARMACIST COUNSELING NOTE  ADHERENCE ASSESSMENT  Adherence strategy: Patient manages medications independently. Takes medications from the pill bottles.    Do you ever forget to take your medication? [] Yes (1) [x] No (0)  Do you ever skip doses due to side effects? [] Yes (1) [x] No (0)  Do you have trouble affording your medicines? [x] Yes (1) [] No (0)  Are you ever unable to pick up your medication due to transportation difficulties? [] Yes (1) [x] No (0)  Do you ever stop taking your medications because you don't believe they are helping? [] Yes (1) [x] No (0)  Total score 1   Recommendations given to patient about increasing adherence: None needed. Barriers include financial difficulties. Patient reports he does have difficulties affording medications.  Guideline-Directed Medical Therapy/Evidence Based Medicine  ACE/ARB/ARNI: Lisinopril 2.5 mg daily Beta Blocker: Carvedilol 3.125 mg BID Aldosterone Antagonist: None Diuretic: None   SUBJECTIVE  HPI: Patient is a 69 y/o M with PMH as below who presents to CHF clinic for new patient visit. Patient was most recently hospitalized 5/18 - 5/19 for acute hypoxic respiratory failure likely secondary to flash pulmonary edema from hypertension. Improved with diuresis and discharged. He has further history of recent laparoscopic bilateral inguinal hernia repair on 4/28 c/b postoperative hypertensive urgency.  Past Medical History:  Diagnosis Date  . CHF (congestive heart failure) ([place])   . Dyspnea   . GERD (gastroesophageal reflux disease)   . History of kidney stones   . Hypertension   . Myocardial infarction ([place]) 2020     OBJECTIVE   Vital signs: HR 54, BP 112/69, weight (pounds) 172 ECHO: Date 09/18/19, EF 40-45%, notes: LV regional wall motion abnormalities, diastolic parameters normal. Mild MVR. Cath: Date 12/31/18, notes: multivessel disease. Intervention with DES to  proximal LAD lesion which was 90% stenosed.  BMP Latest Ref Rng & Units 09/18/2019 09/17/2019 08/28/2019  Glucose 70 - 99 mg/dL 127(H) 226(H) 162(H)  BUN 8 - 23 mg/dL 18 15 23   Creatinine 0.61 - 1.24 mg/dL 1.07 1.14 1.23  Sodium 135 - 145 mmol/L 139 137 137  Potassium 3.5 - 5.1 mmol/L 4.3 3.9 5.4(H)  Chloride 98 - 111 mmol/L 97(L) 97(L) 99  CO2 22 - 32 mmol/L 32 28 28  Calcium 8.9 - 10.3 mg/dL 8.8(L) 9.1 9.2    ASSESSMENT  Patient is well appearing and in no acute distress. Denies shortness of breath / edema. Endorses taking medications daily as prescribed. Denies adverse effects of therapy / missed doses. Weighs daily at home and reports weights have been stable. He does endorse financial hardships and difficulty affording medications.  PLAN  1). HFrEF -GDMT with lisinopril 2.5 mg daily, carvedilol 3.125 mg BID -He is not on diuretic therapy ; reports he does not usually have a problem with edema -Continue daily weights -Discussed with provider - no changes to medication regimen today  2). Hypertension -Normotensive today in office -Antihypertensive regimen includes amlodipine 5 mg daily, lisinopril 2.5 mg daily, carvedilol 3.125 mg BID, clonidine 0.1 mg TID -Checks BP when he has a headache or feels "weird" -Counseled patient on never suddenly stopping clonidine / carvedilol due to risk for rebound hypertension -Consider tapering off clonidine / amlodipine and titrating lisinopril / carvedilol  3). CAD -DAPT with aspirin 81 mg daily + clopidogrel 75 mg daily -Atorvastatin 40 mg daily -Denies signs / symptoms of bleeding  Time spent: 15 minutes  [place] Resident 09/26/2019 11:06 AM    Current  Outpatient Medications:  .  albuterol (PROVENTIL HFA) 108 (90 Base) MCG/ACT inhaler, Inhale 2 puffs into the lungs every 6 (six) hours as needed for wheezing or shortness of breath. , Disp: , Rfl:  .  ALPRAZolam (XANAX) 0.25 MG tablet, Take 0.25 mg by mouth 2 (two)  times daily as needed for anxiety or sleep. , Disp: , Rfl:  .  amLODipine (NORVASC) 5 MG tablet, Take 5 mg by mouth daily. , Disp: , Rfl:  .  aspirin EC 81 MG EC tablet, Take 1 tablet (81 mg total) by mouth daily., Disp: 30 tablet, Rfl: 0 .  atorvastatin (LIPITOR) 40 MG tablet, Take 40 mg by mouth daily., Disp: , Rfl:  .  carvedilol (COREG) 3.125 MG tablet, Take 1 tablet (3.125 mg total) by mouth 2 (two) times daily with a meal. Hold for heart rate less than 60., Disp: 60 tablet, Rfl: 0 .  cloNIDine (CATAPRES) 0.1 MG tablet, Take 0.1 mg by mouth 3 (three) times daily. , Disp: , Rfl:  .  clopidogrel (PLAVIX) 75 MG tablet, Take 75 mg by mouth daily., Disp: , Rfl:  .  lisinopril (ZESTRIL) 2.5 MG tablet, Take 1 tablet (2.5 mg total) by mouth daily., Disp: 30 tablet, Rfl: 0 .  oxyCODONE-acetaminophen (PERCOCET) 7.5-325 MG tablet, Take 1 tablet by mouth every 4 (four) hours as needed for severe pain., Disp: 20 tablet, Rfl: 0   COUNSELING POINTS/CLINICAL PEARLS  Carvedilol (Goal: weight less than 85 kg is 25 mg BID, weight greater than 85 kg is 50 mg BID)  Patient should avoid activities requiring coordination until drug effects are realized, as drug may cause dizziness.  This drug may cause diarrhea, nausea, vomiting, arthralgia, back pain, myalgia, headache, vision disorder, erectile dysfunction, reduced libido, or fatigue.  Instruct patient to report signs/symptoms of adverse cardiovascular effects such as hypotension (especially in elderly patients), arrhythmias, syncope, palpitations, angina, or edema.  Drug may mask symptoms of hypoglycemia. Advise diabetic patients to carefully monitor blood sugar levels.  Patient should take drug with food.  Advise patient against sudden discontinuation of drug. Lisinopril (Goal: 20 to 40 mg once daily)  This drug may cause nausea, vomiting, dizziness, headache, or angioedema of face, lips, throat, or intestines.  Instruct patient to report signs/symptoms of  hypotension, or a persistent cough.  Advise patient against sudden discontinuation of drug.  DRUGS TO AVOID IN HEART FAILURE  Drug or Class Mechanism  Analgesics . NSAIDs . COX-2 inhibitors . Glucocorticoids  Sodium and water retention, increased systemic vascular resistance, decreased response to diuretics   Diabetes Medications . Metformin . Thiazolidinediones o Rosiglitazone (Avandia) o Pioglitazone (Actos) . DPP4 Inhibitors o Saxagliptin (Onglyza) o Sitagliptin (Januvia)   Lactic acidosis Possible calcium channel blockade   Unknown  Antiarrhythmics . Class I  o Flecainide o Disopyramide . Class III o Sotalol . Other o Dronedarone  Negative inotrope, proarrhythmic   Proarrhythmic, beta blockade  Negative inotrope  Antihypertensives . Alpha Blockers o Doxazosin . Calcium Channel Blockers o Diltiazem o Verapamil o Nifedipine . Central Alpha Adrenergics o Moxonidine . Peripheral Vasodilators o Minoxidil  Increases renin and aldosterone  Negative inotrope    Possible sympathetic withdrawal  Unknown  Anti-infective . Itraconazole . Amphotericin B  Negative inotrope Unknown  Hematologic . Anagrelide . Cilostazol   Possible inhibition of PD IV Inhibition of PD III causing arrhythmias  Neurologic/Psychiatric . Stimulants . Anti-Seizure Drugs o Carbamazepine o Pregabalin . Antidepressants o Tricyclics o Citalopram . Parkinsons o Bromocriptine o Pergolide  o Pramipexole . Antipsychotics o Clozapine . Antimigraine o Ergotamine o Methysergide . Appetite suppressants . Bipolar o Lithium  Peripheral alpha and beta agonist activity  Negative inotrope and chronotrope Calcium channel blockade  Negative inotrope, proarrhythmic Dose-dependent QT prolongation  Excessive serotonin activity/valvular damage Excessive serotonin activity/valvular damage Unknown  IgE mediated hypersensitivy, calcium channel blockade  Excessive serotonin  activity/valvular damage Excessive serotonin activity/valvular damage Valvular damage  Direct myofibrillar degeneration, adrenergic stimulation  Antimalarials . Chloroquine . Hydroxychloroquine Intracellular inhibition of lysosomal enzymes  Urologic Agents . Alpha Blockers o Doxazosin o Prazosin o Tamsulosin o Terazosin  Increased renin and aldosterone  Adapted from Page RL, et al. "Drugs That May Cause or Exacerbate Heart Failure: A Scientific Statement from the [employer]." Circulation 2016; O8193432. DOI: 10.1161/CIR.0000000000000426   MEDICATION ADHERENCES TIPS AND STRATEGIES 1. Taking medication as prescribed improves patient outcomes in heart failure (reduces hospitalizations, improves symptoms, increases survival) 2. Side effects of medications can be managed by decreasing doses, switching agents, stopping drugs, or adding additional therapy. Please let someone in the [facility] know if you have having bothersome side effects so we can modify your regimen. Do not alter your medication regimen without talking to [place].  3. Medication reminders can help patients remember to take drugs on time. If you are missing or forgetting doses you can try linking behaviors, using pill boxes, or an electronic reminder like an alarm on your phone or an app. Some people can also get automated phone calls as medication reminders.

## 2019-09-26 NOTE — Patient Instructions (Addendum)
Continue weighing daily and call for an overnight weight gain of > 2 pounds or a weekly weight gain of >5 pounds.   Call us in the future if you'd like to schedule another appointment 

## 2019-10-17 DIAGNOSIS — I712 Thoracic aortic aneurysm, without rupture, unspecified: Secondary | ICD-10-CM | POA: Diagnosis present

## 2020-03-26 ENCOUNTER — Emergency Department: Payer: Medicare Other

## 2020-03-26 ENCOUNTER — Other Ambulatory Visit: Payer: Self-pay

## 2020-03-26 ENCOUNTER — Emergency Department
Admission: EM | Admit: 2020-03-26 | Discharge: 2020-03-26 | Disposition: A | Discharge status: Home / Self Care | Payer: Medicare Other | Attending: Emergency Medicine | Admitting: Emergency Medicine

## 2020-03-26 DIAGNOSIS — Z87891 Personal history of nicotine dependence: Secondary | ICD-10-CM | POA: Diagnosis not present

## 2020-03-26 DIAGNOSIS — T402X1A Poisoning by other opioids, accidental (unintentional), initial encounter: Secondary | ICD-10-CM | POA: Diagnosis present

## 2020-03-26 DIAGNOSIS — Z20822 Contact with and (suspected) exposure to covid-19: Secondary | ICD-10-CM | POA: Diagnosis not present

## 2020-03-26 DIAGNOSIS — Z79899 Other long term (current) drug therapy: Secondary | ICD-10-CM | POA: Insufficient documentation

## 2020-03-26 DIAGNOSIS — Z7982 Long term (current) use of aspirin: Secondary | ICD-10-CM | POA: Insufficient documentation

## 2020-03-26 DIAGNOSIS — I110 Hypertensive heart disease with heart failure: Secondary | ICD-10-CM | POA: Insufficient documentation

## 2020-03-26 DIAGNOSIS — I509 Heart failure, unspecified: Secondary | ICD-10-CM | POA: Diagnosis not present

## 2020-03-26 DIAGNOSIS — J449 Chronic obstructive pulmonary disease, unspecified: Secondary | ICD-10-CM | POA: Diagnosis not present

## 2020-03-26 HISTORY — DX: Chronic obstructive pulmonary disease, unspecified: J44.9

## 2020-03-26 LAB — RESP PANEL BY RT-PCR (FLU A&B, COVID) ARPGX2
Influenza A by PCR: NEGATIVE
Influenza B by PCR: NEGATIVE
SARS Coronavirus 2 by RT PCR: NEGATIVE

## 2020-03-26 LAB — BASIC METABOLIC PANEL
Anion gap: 9 (ref 5–15)
BUN: 25 mg/dL — ABNORMAL HIGH (ref 8–23)
CO2: 25 mmol/L (ref 22–32)
Calcium: 8.4 mg/dL — ABNORMAL LOW (ref 8.9–10.3)
Chloride: 103 mmol/L (ref 98–111)
Creatinine, Ser: 1.05 mg/dL (ref 0.61–1.24)
GFR, Estimated: >60 mL/min (ref >60)
Glucose, Bld: 142 mg/dL — ABNORMAL HIGH (ref 70–99)
Potassium: 4.4 mmol/L (ref 3.5–5.1)
Sodium: 137 mmol/L (ref 135–145)

## 2020-03-26 LAB — CBC
HCT: 41.9 % (ref 39.0–52.0)
Hemoglobin: 13.8 g/dL (ref 13.0–17.0)
MCH: 27.8 pg (ref 26.0–34.0)
MCHC: 32.9 g/dL (ref 30.0–36.0)
MCV: 84.5 fL (ref 80.0–100.0)
Platelets: 153 10*3/uL (ref 150–400)
RBC: 4.96 MIL/uL (ref 4.22–5.81)
RDW: 12.5 % (ref 11.5–15.5)
WBC: 7.7 10*3/uL (ref 4.0–10.5)
nRBC: 0 % (ref 0.0–0.2)

## 2020-03-26 LAB — BRAIN NATRIURETIC PEPTIDE: B Natriuretic Peptide: 39.3 pg/mL (ref 0.0–100.0)

## 2020-03-26 MED ORDER — IPRATROPIUM-ALBUTEROL 0.5-2.5 (3) MG/3ML IN SOLN
3.0000 mL | Freq: Once | RESPIRATORY_TRACT | Status: AC
  Administered 2020-03-26: 3 mL via RESPIRATORY_TRACT
  Filled 2020-03-26: qty 3

## 2020-03-26 MED ORDER — CLONIDINE HCL 0.1 MG PO TABS
0.1000 mg | ORAL_TABLET | Freq: Once | ORAL | Status: AC
  Administered 2020-03-26: 0.1 mg via ORAL
  Filled 2020-03-26: qty 1

## 2020-03-26 MED ORDER — AMLODIPINE BESYLATE 5 MG PO TABS
5.0000 mg | ORAL_TABLET | Freq: Once | ORAL | Status: AC
  Administered 2020-03-26: 5 mg via ORAL
  Filled 2020-03-26: qty 1

## 2020-03-26 MED ORDER — CARVEDILOL 6.25 MG PO TABS
3.1250 mg | ORAL_TABLET | ORAL | Status: AC
  Administered 2020-03-26: 3.125 mg via ORAL
  Filled 2020-03-26: qty 1

## 2020-03-26 MED ORDER — ATORVASTATIN CALCIUM 20 MG PO TABS
40.0000 mg | ORAL_TABLET | Freq: Every day | ORAL | Status: DC
  Administered 2020-03-26: 40 mg via ORAL
  Filled 2020-03-26: qty 2

## 2020-03-26 NOTE — ED Notes (Signed)
Pt oxygen levels fluctuate 93-96% with good waveform while talking and walking next to bedside. Pt denies shortness of breath at this time.

## 2020-03-26 NOTE — ED Notes (Signed)
Pt transported to xray at this time

## 2020-03-26 NOTE — ED Provider Notes (Signed)
[facility] Emergency Department Provider Note   ____________________________________________   First MD Initiated Contact with Patient 03/26/20 1903     (approximate)  I have reviewed the triage vital signs and the nursing notes.   HISTORY  Chief Complaint Drug Overdose    HPI Theodore Obrien is a 69 y.o. male history of congestive heart failure, COPD previous MI  Patient reports that tonight he had Thanksgiving with his sister, he came back and stopped and purchased heroin which she has a long history of use over the last almost 20 years.  He went to a friend's house, he went into the bathroom, and then he injected heroin, next thing he remembers is waking up with the fire department and EMS.  He reports that he overdosed.  Reports it may be that there could have been fentanyl or something else mixed into the content of the heroin  He denies any other symptoms.  He has not been recently ill.  The celebrated Thanksgiving with family normally and came home to [place] area without difficulty  Denies shortness of breath.  No fevers or chills.  Does have a long history of COPD.  Denies wheezing or cough.  Reports he feels well now.   Past Medical History:  Diagnosis Date  . CHF (congestive heart failure) ([place])   . COPD (chronic obstructive pulmonary disease) ([place])   . Dyspnea   . GERD (gastroesophageal reflux disease)   . History of kidney stones   . Hypertension   . Myocardial infarction [facility]) 2020    Patient Active Problem List   Diagnosis Date Noted  . Malnutrition of moderate degree 09/18/2019  . Acute cardiogenic pulmonary edema ([place]) 09/17/2019  . Hypertensive urgency 08/28/2019  . CAD (coronary artery disease) 08/28/2019  . Acute respiratory failure with hypoxia ([place]) 12/27/2018  . Anxiety state 07/23/2015  . Benign essential HTN 07/23/2015  . Drug-induced peripheral neuropathy ([place]) 07/23/2015  . Opioid dependence with withdrawal ([place])     . Opiate dependence ([place]) 03/23/2015  . Hernia of abdominal cavity 12/09/2014    Past Surgical History:  Procedure Laterality Date  . CARPAL TUNNEL RELEASE Left   . CORONARY STENT INTERVENTION N/A 12/31/2018   Procedure: CORONARY STENT INTERVENTION;  Surgeon: Yolonda Kida, MD;  Location: [place] [facility];  Service: Cardiovascular;  Laterality: N/A;  . FEMORAL HERNIA REPAIR Left 10/27/2014   Procedure: Repair of left femoral and direct hernia with mesh ;  Surgeon: Sherri Rad, MD;  Location: [facility] ORS;  Service: General;  Laterality: Left;  . FRACTURE SURGERY     right leg  . LEFT HEART CATH AND CORONARY ANGIOGRAPHY N/A 12/31/2018   Procedure: LEFT HEART CATH AND CORONARY ANGIOGRAPHY;  Surgeon: Corey Skains, MD;  Location: [place] [facility];  Service: Cardiovascular;  Laterality: N/A;  . XI ROBOTIC ASSISTED INGUINAL HERNIA REPAIR WITH MESH Bilateral 08/28/2019   Procedure: XI ROBOTIC ASSISTED INGUINAL HERNIA REPAIR WITH MESH;  Surgeon: Herbert Pun, MD;  Location: [facility] ORS;  Service: General;  Laterality: Bilateral;    Prior to Admission medications   Medication Sig Start Date End Date Taking? Authorizing Provider  albuterol (PROVENTIL HFA) 108 (90 Base) MCG/ACT inhaler Inhale 2 puffs into the lungs every 6 (six) hours as needed for wheezing or shortness of breath.     [provider]  ALPRAZolam Duanne Moron) 0.25 MG tablet Take 0.25 mg by mouth 2 (two) times daily as needed for anxiety or sleep.  08/03/17  [provider]  amLODipine (NORVASC) 5 MG tablet Take 5 mg by mouth daily.     [provider]  aspirin EC 81 MG EC tablet Take 1 tablet (81 mg total) by mouth daily. 01/02/19   Stark Jock Jude, MD  atorvastatin (LIPITOR) 40 MG tablet Take 40 mg by mouth daily.    [provider]  carvedilol (COREG) 3.125 MG tablet Take 1 tablet (3.125 mg total) by mouth 2 (two) times daily with a meal. Hold for heart rate less than 60. 01/01/19   Ojie, Jude, MD   cloNIDine (CATAPRES) 0.1 MG tablet Take 0.1 mg by mouth 3 (three) times daily.  06/19/17   [provider]  clopidogrel (PLAVIX) 75 MG tablet Take 75 mg by mouth daily.    [provider]  lisinopril (ZESTRIL) 2.5 MG tablet Take 1 tablet (2.5 mg total) by mouth daily. 09/19/19 10/19/19  Sidney Ace, MD  oxyCODONE-acetaminophen (PERCOCET) 7.5-325 MG tablet Take 1 tablet by mouth every 4 (four) hours as needed for severe pain. Patient not taking: Reported on 09/26/2019 08/29/19 08/28/20  Herbert Pun, MD    Allergies Zyvox [linezolid]  Family History  Problem Relation Age of Onset  . Hypertension Mother   . Heart disease Mother   . Prostate cancer Father   . Leukemia Father     Social History Social History   Tobacco Use  . Smoking status: Former Smoker    Packs/day: 1.00    Years: 6.00    Pack years: 6.00    Types: Cigarettes    Quit date: 2018    Years since quitting: 3.9  . Smokeless tobacco: Never Used  . Tobacco comment: quit smoking 2018  Vaping Use  . Vaping Use: Never used  Substance Use Topics  . Alcohol use: Yes    Comment: occasionally beer  . 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    Review of Systems Constitutional: No fever/chills Eyes: No visual changes. ENT: No sore throat. Cardiovascular: Denies chest pain. Respiratory: Denies shortness of breath. Gastrointestinal: No abdominal pain.   Genitourinary: Negative for dysuria. Musculoskeletal: Negative for back pain. Skin: Negative for rash except he does reported abrasion over the back of his scalp that is bled slightly.  When he overdose he fell and hit the side of the sink or dresser. Neurological: Negative for headaches, areas of focal weakness or numbness.    ____________________________________________   PHYSICAL EXAM:  VITAL SIGNS: ED Triage Vitals  Enc Vitals Group     BP 03/26/20 1913 (!) 167/109     Pulse Rate 03/26/20 1913 98     Resp 03/26/20  1913 18     Temp 03/26/20 1913 98.9 F (37.2 C)     Temp Source 03/26/20 1913 Oral     SpO2 03/26/20 1913 92 %     Weight 03/26/20 1915 174 lb (78.9 kg)     Height 03/26/20 1915 6' (1.829 m)     Head Circumference --      Peak Flow --      Pain Score 03/26/20 1913 1     Pain Loc --      Pain Edu? --      Excl. in [place]? --     Constitutional: Alert and oriented. Well appearing and in no acute distress. Eyes: Conjunctivae are normal. Head: Atraumatic. Nose: No congestion/rhinnorhea. Mouth/Throat: Mucous membranes are moist. Neck: No stridor.  Cardiovascular: Normal rate, regular rhythm. Grossly normal  heart sounds.  Good peripheral circulation. Respiratory: Normal respiratory effort.  No retractions. Lungs CTAB.  Patient saturation about 90 to 92% on room air. Gastrointestinal: Soft and nontender. No distention. Musculoskeletal: No lower extremity tenderness nor edema. Neurologic:  Normal speech and language. No gross focal neurologic deficits are appreciated.  Skin:  Skin is warm, dry and intact. No rash noted. Psychiatric: Mood and affect are normal. Speech and behavior are normal.  ____________________________________________   LABS (all labs ordered are listed, but only abnormal results are displayed)  Labs Reviewed  BASIC METABOLIC PANEL - Abnormal; Notable for the following components:      Result Value   Glucose, Bld 142 (*)    BUN 25 (*)    Calcium 8.4 (*)    All other components within normal limits  RESP PANEL BY RT-PCR (FLU A&B, COVID) ARPGX2  CBC  BRAIN NATRIURETIC PEPTIDE  URINE DRUG SCREEN, QUALITATIVE ([facility] ONLY)   ____________________________________________  EKG  Reviewed interpreted by me Time 1910 Heart rate 90 QRS 140 QTc 490 Right bundle branch block, sinus rhythm.  Occasional PVC.  No evidence of acute ischemic abnormality ____________________________________________  RADIOLOGY  Chest x-ray reviewed and  normal ____________________________________________   PROCEDURES  Procedure(s) performed: None  Procedures  Critical Care performed: No  ____________________________________________   INITIAL IMPRESSION / ASSESSMENT AND PLAN / ED COURSE  Pertinent labs & imaging results that were available during my care of the patient were reviewed by me and considered in my medical decision making (see chart for details).   Patient presents for episode of unresponsiveness reversed with naloxone.  He freely admits to heroin use just prior to the event occurring.  This appears consistent with unintentional overdose.  He is awake alert without ongoing symptom or concern at this time.  Tells me he does have naloxone that he has prescription at home, but lives by himself.  Today he was using at a friend's house.  He has excellent insight into the risks of his disease and use of opioids, reports that this time he may have actually had fentanyl or something else.  He reports a long history of opioid use unfortunately stemming from what appears to be chronic injury in the early 2000's.  He is willing to follow-up with the residential treatment services and we discussed medication assisted therapy.  Does report he has tried this previous but Suboxone was extremely expensive  No recent illness no fevers or chills.  Reassuring examination including cardiac pulmonary and vascular exam.  Normal neurologic baseline.  \----------------------------------------- 8:53 PM on 03/26/2020 -----------------------------------------  Patient observed for several hours, no recurrence of overdose.  He is asymptomatic.  Ambulates with good pulse oximetry no shortness of breath no other symptoms.  He does tell me that he has been told in the past when he is at rest his oxygen levels have been a little low at the doctor's office but they come up after he sits up or walks a little.  Suspect this may be some underlying chronic lung  disorder as he does have a history of COPD, but currently no wheezing no shortness of breath and no symptoms.  Return precautions and treatment recommendations and follow-up discussed with the patient who is agreeable with the plan.       ____________________________________________   FINAL CLINICAL IMPRESSION(S) / ED DIAGNOSES  Final diagnoses:  Opioid overdose, accidental or unintentional, initial encounter [facility])        Note:  This document was prepared using Dragon  voice recognition software and may include unintentional dictation errors       Delman Kitten, MD 03/26/20 2054

## 2020-03-26 NOTE — Discharge Instructions (Signed)
You have been seen in the Emergency Department (ED) today for substance abuse.  You have been evaluated by the behavioral medicine specialists and are being discharged to Residential Treatment Services ([facility]).  Please return to the ED immediately if you have ANY thoughts of hurting yourself or anyone else, so that we may help you.  Please avoid alcohol and drug use.  Follow up with your doctor and/or therapist as soon as possible regarding today's ED  visit.   Please follow up any other recommendations and clinic appointments provided by the psychiatry team that saw you in the Emergency Department.

## 2020-03-26 NOTE — ED Triage Notes (Signed)
Pt arrives from home via [facility] w cc of doing heroine tonight. Pt states "thinks heroine was laced with fentanyl". Pt slid down doorframe, dried blood noted on back of head. Pt given 2 intranasal narcan, now A&Ox4. Room air sats noted at 89-92%. Pt hx hypertension, states not taking meds. Hx COPD

## 2020-03-26 NOTE — ED Notes (Signed)
Wound care performed to back of scalp. No large lacerations noted, small area of scabbing on posterior head. Pt denies pain at this time.

## 2020-06-04 ENCOUNTER — Other Ambulatory Visit: Payer: Self-pay

## 2020-06-04 ENCOUNTER — Emergency Department
Admission: EM | Admit: 2020-06-04 | Discharge: 2020-06-04 | Disposition: A | Discharge status: Home / Self Care | Payer: Medicare HMO | Attending: Emergency Medicine | Admitting: Emergency Medicine

## 2020-06-04 ENCOUNTER — Encounter: Payer: Self-pay | Admitting: Emergency Medicine

## 2020-06-04 DIAGNOSIS — Z87891 Personal history of nicotine dependence: Secondary | ICD-10-CM | POA: Insufficient documentation

## 2020-06-04 DIAGNOSIS — T401X1A Poisoning by heroin, accidental (unintentional), initial encounter: Secondary | ICD-10-CM | POA: Insufficient documentation

## 2020-06-04 DIAGNOSIS — Z7982 Long term (current) use of aspirin: Secondary | ICD-10-CM | POA: Insufficient documentation

## 2020-06-04 DIAGNOSIS — Z79899 Other long term (current) drug therapy: Secondary | ICD-10-CM | POA: Diagnosis not present

## 2020-06-04 DIAGNOSIS — J449 Chronic obstructive pulmonary disease, unspecified: Secondary | ICD-10-CM | POA: Insufficient documentation

## 2020-06-04 DIAGNOSIS — Z7902 Long term (current) use of antithrombotics/antiplatelets: Secondary | ICD-10-CM | POA: Insufficient documentation

## 2020-06-04 DIAGNOSIS — I110 Hypertensive heart disease with heart failure: Secondary | ICD-10-CM | POA: Insufficient documentation

## 2020-06-04 DIAGNOSIS — I2510 Atherosclerotic heart disease of native coronary artery without angina pectoris: Secondary | ICD-10-CM | POA: Diagnosis not present

## 2020-06-04 DIAGNOSIS — I509 Heart failure, unspecified: Secondary | ICD-10-CM | POA: Diagnosis not present

## 2020-06-04 LAB — CBC
HCT: 47.8 % (ref 39.0–52.0)
Hemoglobin: 15.9 g/dL (ref 13.0–17.0)
MCH: 27.9 pg (ref 26.0–34.0)
MCHC: 33.3 g/dL (ref 30.0–36.0)
MCV: 84 fL (ref 80.0–100.0)
Platelets: 185 10*3/uL (ref 150–400)
RBC: 5.69 MIL/uL (ref 4.22–5.81)
RDW: 12.6 % (ref 11.5–15.5)
WBC: 4.4 10*3/uL (ref 4.0–10.5)
nRBC: 0 % (ref 0.0–0.2)

## 2020-06-04 LAB — COMPREHENSIVE METABOLIC PANEL
ALT: 20 U/L (ref 0–44)
AST: 21 U/L (ref 15–41)
Albumin: 4.4 g/dL (ref 3.5–5.0)
Alkaline Phosphatase: 70 U/L (ref 38–126)
Anion gap: 11 (ref 5–15)
BUN: 18 mg/dL (ref 8–23)
CO2: 26 mmol/L (ref 22–32)
Calcium: 9.7 mg/dL (ref 8.9–10.3)
Chloride: 102 mmol/L (ref 98–111)
Creatinine, Ser: 1.34 mg/dL — ABNORMAL HIGH (ref 0.61–1.24)
GFR, Estimated: 58 mL/min — ABNORMAL LOW (ref >60)
Glucose, Bld: 169 mg/dL — ABNORMAL HIGH (ref 70–99)
Potassium: 4.3 mmol/L (ref 3.5–5.1)
Sodium: 139 mmol/L (ref 135–145)
Total Bilirubin: 3.4 mg/dL — ABNORMAL HIGH (ref 0.3–1.2)
Total Protein: 8 g/dL (ref 6.5–8.1)

## 2020-06-04 LAB — ETHANOL: Alcohol, Ethyl (B): <10 mg/dL (ref <10)

## 2020-06-04 NOTE — ED Notes (Signed)
Pt signed physical discharge form. Pt ambulated to lobby.

## 2020-06-04 NOTE — ED Provider Notes (Signed)
[facility] Emergency Department Provider Note  ____________________________________________   Event Date/Time   First MD Initiated Contact with Patient 06/04/20 1211     (approximate)  I have reviewed the triage vital signs and the nursing notes.   HISTORY  Chief Complaint Drug Overdose    HPI Theodore Obrien is a 70 y.o. male with history of COPD, hypertension, CHF, here with accidental drug overdose.  The patient reports that he was using heroin prior to arrival.  He believes it was laced with fentanyl.  He reportedly shot up and then does not remember anything.  Per EMS report, the patient was apneic and markedly confused on their arrival.  He was given Narcan with resolution of his bradypnea. He now feels normal and back to baseline. This was not intentional.  There is no purpose of self-harm.  No SI or HI.  No hallucinations.        Past Medical History:  Diagnosis Date  . CHF (congestive heart failure) ([place])   . COPD (chronic obstructive pulmonary disease) ([place])   . Dyspnea   . GERD (gastroesophageal reflux disease)   . History of kidney stones   . Hypertension   . Myocardial infarction [facility]) 2020    Patient Active Problem List   Diagnosis Date Noted  . Malnutrition of moderate degree 09/18/2019  . Acute cardiogenic pulmonary edema ([place]) 09/17/2019  . Hypertensive urgency 08/28/2019  . CAD (coronary artery disease) 08/28/2019  . Acute respiratory failure with hypoxia ([place]) 12/27/2018  . Anxiety state 07/23/2015  . Benign essential HTN 07/23/2015  . Drug-induced peripheral neuropathy ([place]) 07/23/2015  . Opioid dependence with withdrawal ([place])   . Opiate dependence ([place]) 03/23/2015  . Hernia of abdominal cavity 12/09/2014    Past Surgical History:  Procedure Laterality Date  . CARPAL TUNNEL RELEASE Left   . CORONARY STENT INTERVENTION N/A 12/31/2018   Procedure: CORONARY STENT INTERVENTION;  Surgeon: Yolonda Kida, MD;  Location:  [place] [facility] LAB;  Service: Cardiovascular;  Laterality: N/A;  . FEMORAL HERNIA REPAIR Left 10/27/2014   Procedure: Repair of left femoral and direct hernia with mesh ;  Surgeon: Sherri Rad, MD;  Location: [facility] ORS;  Service: General;  Laterality: Left;  . FRACTURE SURGERY     right leg  . LEFT HEART CATH AND CORONARY ANGIOGRAPHY N/A 12/31/2018   Procedure: LEFT HEART CATH AND CORONARY ANGIOGRAPHY;  Surgeon: Corey Skains, MD;  Location: [place] [facility];  Service: Cardiovascular;  Laterality: N/A;  . XI ROBOTIC ASSISTED INGUINAL HERNIA REPAIR WITH MESH Bilateral 08/28/2019   Procedure: XI ROBOTIC ASSISTED INGUINAL HERNIA REPAIR WITH MESH;  Surgeon: Herbert Pun, MD;  Location: [facility] ORS;  Service: General;  Laterality: Bilateral;    Prior to Admission medications   Medication Sig Start Date End Date Taking? Authorizing Provider  albuterol (PROVENTIL HFA) 108 (90 Base) MCG/ACT inhaler Inhale 2 puffs into the lungs every 6 (six) hours as needed for wheezing or shortness of breath.     [provider]  ALPRAZolam Duanne Moron) 0.25 MG tablet Take 0.25 mg by mouth 2 (two) times daily as needed for anxiety or sleep.  08/03/17   [provider]  amLODipine (NORVASC) 5 MG tablet Take 5 mg by mouth daily.     [provider]  aspirin EC 81 MG EC tablet Take 1 tablet (81 mg total) by mouth daily. 01/02/19   Stark Jock Jude, MD  atorvastatin (LIPITOR) 40 MG tablet Take 40 mg by mouth  daily.    [provider]  carvedilol (COREG) 3.125 MG tablet Take 1 tablet (3.125 mg total) by mouth 2 (two) times daily with a meal. Hold for heart rate less than 60. 01/01/19   Ojie, Jude, MD  cloNIDine (CATAPRES) 0.1 MG tablet Take 0.1 mg by mouth 3 (three) times daily.  06/19/17   [provider]  clopidogrel (PLAVIX) 75 MG tablet Take 75 mg by mouth daily.    [provider]  lisinopril (ZESTRIL) 2.5 MG tablet Take 1 tablet (2.5 mg total) by mouth daily. 09/19/19  10/19/19  Sidney Ace, MD  oxyCODONE-acetaminophen (PERCOCET) 7.5-325 MG tablet Take 1 tablet by mouth every 4 (four) hours as needed for severe pain. Patient not taking: Reported on 09/26/2019 08/29/19 08/28/20  Herbert Pun, MD    Allergies Zyvox [linezolid]  Family History  Problem Relation Age of Onset  . Hypertension Mother   . Heart disease Mother   . Prostate cancer Father   . Leukemia Father     Social History Social History   Tobacco Use  . Smoking status: Former Smoker    Packs/day: 1.00    Years: 6.00    Pack years: 6.00    Types: Cigarettes    Quit date: 2018    Years since quitting: 4.0  . Smokeless tobacco: Never Used  . Tobacco comment: quit smoking 2018  Vaping Use  . Vaping Use: Never used  Substance Use Topics  . Alcohol use: Yes    Comment: occasionally beer  . 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    Review of Systems  Review of Systems  Constitutional: Negative for chills and fever.  HENT: Negative for sore throat.   Respiratory: Negative for shortness of breath.   Cardiovascular: Negative for chest pain.  Gastrointestinal: Negative for abdominal pain.  Genitourinary: Negative for flank pain.  Musculoskeletal: Negative for neck pain.  Skin: Negative for rash and wound.  Allergic/Immunologic: Negative for immunocompromised state.  Neurological: Negative for weakness and numbness.  Hematological: Does not bruise/bleed easily.     ____________________________________________  PHYSICAL EXAM:      VITAL SIGNS: ED Triage Vitals  Enc Vitals Group     BP 06/04/20 1156 (!) 184/100     Pulse Rate 06/04/20 1156 (!) 116     Resp 06/04/20 1156 13     Temp 06/04/20 1156 98.9 F (37.2 C)     Temp Source 06/04/20 1156 Oral     SpO2 06/04/20 1156 93 %     Weight 06/04/20 1158 184 lb (83.5 kg)     Height 06/04/20 1158 6\' 1"  (1.854 m)     Head Circumference --      Peak Flow --      Pain Score 06/04/20 1158 0     Pain Loc  --      Pain Edu? --      Excl. in [place]? --      Physical Exam Vitals and nursing note reviewed.  Constitutional:      General: He is not in acute distress.    Appearance: He is well-developed and well-nourished.  HENT:     Head: Normocephalic and atraumatic.  Eyes:     Conjunctiva/sclera: Conjunctivae normal.  Cardiovascular:     Rate and Rhythm: Normal rate and regular rhythm.     Heart sounds: Normal heart sounds.  Pulmonary:     Effort: Pulmonary effort is normal. No respiratory distress.  Breath sounds: No wheezing.  Abdominal:     General: There is no distension.  Musculoskeletal:        General: No edema.     Cervical back: Neck supple.  Skin:    General: Skin is warm.     Capillary Refill: Capillary refill takes less than 2 seconds.     Findings: No rash.  Neurological:     Mental Status: He is alert and oriented to person, place, and time.     Motor: No abnormal muscle tone.       ____________________________________________   LABS (all labs ordered are listed, but only abnormal results are displayed)  Labs Reviewed  COMPREHENSIVE METABOLIC PANEL - Abnormal; Notable for the following components:      Result Value   Glucose, Bld 169 (*)    Creatinine, Ser 1.34 (*)    Total Bilirubin 3.4 (*)    GFR, Estimated 58 (*)    All other components within normal limits  ETHANOL  CBC  URINE DRUG SCREEN, QUALITATIVE ([facility] ONLY)    ____________________________________________  EKG: Sinus tachycardia, ventricular rate 111.  QRS 139, QTc 41.  No acute ST elevation or depression but no acute evidence of acute ischemia or infarct. ________________________________________  RADIOLOGY All imaging, including plain films, CT scans, and ultrasounds, independently reviewed by me, and interpretations confirmed via formal radiology reads.  ED MD interpretation:     Official radiology report(s): No results  found.  ____________________________________________  PROCEDURES   Procedure(s) performed (including Critical Care):  Procedures  ____________________________________________  INITIAL IMPRESSION / MDM / [place] / ED COURSE  As part of my medical decision making, I reviewed the following data within the [place] notes reviewed and incorporated, Old chart reviewed, Notes from prior ED visits, and [place] Controlled Substance Database       *Riggs Dineen was evaluated in Emergency Department on 06/04/2020 for the symptoms described in the history of present illness. He was evaluated in the context of the global COVID-19 pandemic, which necessitated consideration that the patient might be at risk for infection with the SARS-CoV-2 virus that causes COVID-19. Institutional protocols and algorithms that pertain to the evaluation of patients at risk for COVID-19 are in a state of rapid change based on information released by regulatory bodies including the CDC and federal and state organizations. These policies and algorithms were followed during the patient's care in the ED.  Some ED evaluations and interventions may be delayed as a result of limited staffing during the pandemic.*     Medical Decision Making: 70 year old male here with accidental overdose on heroin.  The patient has been seen here previously for similar issues.  He is now back to his mental baseline, satting well on room air, and denies any intentional overdose.  Lab work is very reassuring.  Patient would like to be discharged, which I think is reasonable.  He has Narcan at home.  Offered him resources but he has resources for local rehab.  ____________________________________________  FINAL CLINICAL IMPRESSION(S) / ED DIAGNOSES  Final diagnoses:  Accidental overdose of heroin, initial encounter ([place])     MEDICATIONS GIVEN DURING THIS VISIT:  Medications - No data to display   ED  Discharge Orders    None       Note:  This document was prepared using Dragon voice recognition software and may include unintentional dictation errors.   Duffy Bruce, MD 06/04/20 [phone]

## 2020-06-04 NOTE — ED Triage Notes (Signed)
Pt presents from home via [facility] with c/o overdose. Drug paraphernalia found at scene. Pt reports taking fentanyl. Pt given narcan use at home by bystander due to patient being unresponsive at home. When ems arrived, patient was alert and responsive. Pt cooperative and answering questions appropriately. Pt denies SI/HI or AH/VH.

## 2020-08-08 ENCOUNTER — Emergency Department: Payer: Medicare HMO

## 2020-08-08 ENCOUNTER — Inpatient Hospital Stay
Admission: EM | Admit: 2020-08-08 | Discharge: 2020-08-09 | DRG: 871 | Discharge status: Against Medical Advice | Payer: Medicare HMO | Attending: Internal Medicine | Admitting: Internal Medicine

## 2020-08-08 ENCOUNTER — Encounter: Payer: Self-pay | Admitting: Internal Medicine

## 2020-08-08 ENCOUNTER — Other Ambulatory Visit: Payer: Self-pay

## 2020-08-08 DIAGNOSIS — R7881 Bacteremia: Secondary | ICD-10-CM | POA: Diagnosis not present

## 2020-08-08 DIAGNOSIS — J9601 Acute respiratory failure with hypoxia: Secondary | ICD-10-CM | POA: Diagnosis present

## 2020-08-08 DIAGNOSIS — A419 Sepsis, unspecified organism: Secondary | ICD-10-CM | POA: Diagnosis not present

## 2020-08-08 DIAGNOSIS — J189 Pneumonia, unspecified organism: Secondary | ICD-10-CM | POA: Diagnosis present

## 2020-08-08 DIAGNOSIS — Z79899 Other long term (current) drug therapy: Secondary | ICD-10-CM

## 2020-08-08 DIAGNOSIS — E785 Hyperlipidemia, unspecified: Secondary | ICD-10-CM | POA: Diagnosis present

## 2020-08-08 DIAGNOSIS — Z888 Allergy status to other drugs, medicaments and biological substances status: Secondary | ICD-10-CM

## 2020-08-08 DIAGNOSIS — F1910 Other psychoactive substance abuse, uncomplicated: Secondary | ICD-10-CM

## 2020-08-08 DIAGNOSIS — I272 Pulmonary hypertension, unspecified: Secondary | ICD-10-CM | POA: Diagnosis present

## 2020-08-08 DIAGNOSIS — Z9861 Coronary angioplasty status: Secondary | ICD-10-CM

## 2020-08-08 DIAGNOSIS — I252 Old myocardial infarction: Secondary | ICD-10-CM

## 2020-08-08 DIAGNOSIS — I509 Heart failure, unspecified: Secondary | ICD-10-CM | POA: Diagnosis present

## 2020-08-08 DIAGNOSIS — F411 Generalized anxiety disorder: Secondary | ICD-10-CM | POA: Diagnosis present

## 2020-08-08 DIAGNOSIS — K219 Gastro-esophageal reflux disease without esophagitis: Secondary | ICD-10-CM | POA: Diagnosis present

## 2020-08-08 DIAGNOSIS — Z87891 Personal history of nicotine dependence: Secondary | ICD-10-CM

## 2020-08-08 DIAGNOSIS — R7989 Other specified abnormal findings of blood chemistry: Secondary | ICD-10-CM | POA: Diagnosis present

## 2020-08-08 DIAGNOSIS — Z87442 Personal history of urinary calculi: Secondary | ICD-10-CM

## 2020-08-08 DIAGNOSIS — I10 Essential (primary) hypertension: Secondary | ICD-10-CM

## 2020-08-08 DIAGNOSIS — Z806 Family history of leukemia: Secondary | ICD-10-CM

## 2020-08-08 DIAGNOSIS — I2510 Atherosclerotic heart disease of native coronary artery without angina pectoris: Secondary | ICD-10-CM | POA: Diagnosis present

## 2020-08-08 DIAGNOSIS — I110 Hypertensive heart disease with heart failure: Secondary | ICD-10-CM | POA: Diagnosis present

## 2020-08-08 DIAGNOSIS — F1120 Opioid dependence, uncomplicated: Secondary | ICD-10-CM | POA: Diagnosis present

## 2020-08-08 DIAGNOSIS — Z7982 Long term (current) use of aspirin: Secondary | ICD-10-CM

## 2020-08-08 DIAGNOSIS — E440 Moderate protein-calorie malnutrition: Secondary | ICD-10-CM | POA: Diagnosis present

## 2020-08-08 DIAGNOSIS — Z8249 Family history of ischemic heart disease and other diseases of the circulatory system: Secondary | ICD-10-CM

## 2020-08-08 DIAGNOSIS — I712 Thoracic aortic aneurysm, without rupture: Secondary | ICD-10-CM | POA: Diagnosis present

## 2020-08-08 DIAGNOSIS — R9431 Abnormal electrocardiogram [ECG] [EKG]: Secondary | ICD-10-CM | POA: Diagnosis present

## 2020-08-08 DIAGNOSIS — G8929 Other chronic pain: Secondary | ICD-10-CM | POA: Diagnosis present

## 2020-08-08 DIAGNOSIS — F1123 Opioid dependence with withdrawal: Secondary | ICD-10-CM | POA: Diagnosis present

## 2020-08-08 DIAGNOSIS — Z5329 Procedure and treatment not carried out because of patient's decision for other reasons: Secondary | ICD-10-CM | POA: Diagnosis present

## 2020-08-08 DIAGNOSIS — R0602 Shortness of breath: Secondary | ICD-10-CM | POA: Diagnosis not present

## 2020-08-08 DIAGNOSIS — Z7902 Long term (current) use of antithrombotics/antiplatelets: Secondary | ICD-10-CM

## 2020-08-08 DIAGNOSIS — J440 Chronic obstructive pulmonary disease with acute lower respiratory infection: Secondary | ICD-10-CM | POA: Diagnosis present

## 2020-08-08 DIAGNOSIS — Z8042 Family history of malignant neoplasm of prostate: Secondary | ICD-10-CM

## 2020-08-08 DIAGNOSIS — Z20822 Contact with and (suspected) exposure to covid-19: Secondary | ICD-10-CM | POA: Diagnosis present

## 2020-08-08 LAB — LACTIC ACID, PLASMA
Lactic Acid, Venous: 1.5 mmol/L (ref 0.5–1.9)
Lactic Acid, Venous: 1.2 mmol/L (ref 0.5–1.9)

## 2020-08-08 LAB — URINALYSIS, COMPLETE (UACMP) WITH MICROSCOPIC
Bacteria, UA: NONE SEEN
Bilirubin Urine: NEGATIVE
Glucose, UA: 50 mg/dL — AB
Ketones, ur: NEGATIVE mg/dL
Leukocytes,Ua: NEGATIVE
Nitrite: NEGATIVE
Protein, ur: NEGATIVE mg/dL
Specific Gravity, Urine: 1.006 (ref 1.005–1.030)
Squamous Epithelial / HPF: NONE SEEN (ref 0–5)
pH: 7 (ref 5.0–8.0)

## 2020-08-08 LAB — CBC WITH DIFFERENTIAL/PLATELET
Abs Immature Granulocytes: 0.03 10*3/uL (ref 0.00–0.07)
Basophils Absolute: 0 10*3/uL (ref 0.0–0.1)
Basophils Relative: 1 %
Eosinophils Absolute: 0 10*3/uL (ref 0.0–0.5)
Eosinophils Relative: 1 %
HCT: 44.6 % (ref 39.0–52.0)
Hemoglobin: 14.2 g/dL (ref 13.0–17.0)
Immature Granulocytes: 1 %
Lymphocytes Relative: 4 %
Lymphs Abs: 0.2 10*3/uL — ABNORMAL LOW (ref 0.7–4.0)
MCH: 27.8 pg (ref 26.0–34.0)
MCHC: 31.8 g/dL (ref 30.0–36.0)
MCV: 87.5 fL (ref 80.0–100.0)
Monocytes Absolute: 0 10*3/uL — ABNORMAL LOW (ref 0.1–1.0)
Monocytes Relative: 1 %
Neutro Abs: 4 10*3/uL (ref 1.7–7.7)
Neutrophils Relative %: 92 %
Platelets: 126 10*3/uL — ABNORMAL LOW (ref 150–400)
RBC: 5.1 MIL/uL (ref 4.22–5.81)
RDW: 13.5 % (ref 11.5–15.5)
WBC: 4.3 10*3/uL (ref 4.0–10.5)
nRBC: 0 % (ref 0.0–0.2)

## 2020-08-08 LAB — COMPREHENSIVE METABOLIC PANEL
ALT: 23 U/L (ref 0–44)
AST: 36 U/L (ref 15–41)
Albumin: 3.9 g/dL (ref 3.5–5.0)
Alkaline Phosphatase: 176 U/L — ABNORMAL HIGH (ref 38–126)
Anion gap: 8 (ref 5–15)
BUN: 20 mg/dL (ref 8–23)
CO2: 26 mmol/L (ref 22–32)
Calcium: 8.8 mg/dL — ABNORMAL LOW (ref 8.9–10.3)
Chloride: 101 mmol/L (ref 98–111)
Creatinine, Ser: 0.98 mg/dL (ref 0.61–1.24)
GFR, Estimated: >60 mL/min (ref >60)
Glucose, Bld: 106 mg/dL — ABNORMAL HIGH (ref 70–99)
Potassium: 4.3 mmol/L (ref 3.5–5.1)
Sodium: 135 mmol/L (ref 135–145)
Total Bilirubin: 3.6 mg/dL — ABNORMAL HIGH (ref 0.3–1.2)
Total Protein: 6.9 g/dL (ref 6.5–8.1)

## 2020-08-08 LAB — RESP PANEL BY RT-PCR (FLU A&B, COVID) ARPGX2
Influenza A by PCR: NEGATIVE
Influenza B by PCR: NEGATIVE
SARS Coronavirus 2 by RT PCR: NEGATIVE

## 2020-08-08 LAB — PROTIME-INR
INR: 1.1 (ref 0.8–1.2)
Prothrombin Time: 13.7 seconds (ref 11.4–15.2)

## 2020-08-08 LAB — TSH: TSH: 0.514 u[IU]/mL (ref 0.350–4.500)

## 2020-08-08 LAB — PROCALCITONIN: Procalcitonin: 12.29 ng/mL

## 2020-08-08 LAB — APTT: aPTT: 29 seconds (ref 24–36)

## 2020-08-08 MED ORDER — ALPRAZOLAM 0.25 MG PO TABS
0.2500 mg | ORAL_TABLET | Freq: Two times a day (BID) | ORAL | Status: DC | PRN
  Administered 2020-08-08: 0.25 mg via ORAL
  Filled 2020-08-08: qty 1

## 2020-08-08 MED ORDER — CLOPIDOGREL BISULFATE 75 MG PO TABS
75.0000 mg | ORAL_TABLET | Freq: Every day | ORAL | Status: DC
  Administered 2020-08-08 – 2020-08-09 (×2): 75 mg via ORAL
  Filled 2020-08-08 (×2): qty 1

## 2020-08-08 MED ORDER — ACETAMINOPHEN 650 MG RE SUPP: 650.0000 mg | Freq: Four times a day (QID) | RECTAL | Status: DC | PRN

## 2020-08-08 MED ORDER — ACETAMINOPHEN 325 MG PO TABS: 650.0000 mg | ORAL_TABLET | Freq: Four times a day (QID) | ORAL | Status: DC | PRN

## 2020-08-08 MED ORDER — LACTATED RINGERS IV SOLN: INTRAVENOUS | Status: DC

## 2020-08-08 MED ORDER — VANCOMYCIN HCL 10 G IV SOLR
1750.0000 mg | Freq: Once | INTRAVENOUS | Status: DC
  Filled 2020-08-08: qty 1750

## 2020-08-08 MED ORDER — SODIUM CHLORIDE 0.9 % IV SOLN
1.0000 g | Freq: Once | INTRAVENOUS | Status: AC
  Administered 2020-08-08: 1 g via INTRAVENOUS
  Filled 2020-08-08: qty 10

## 2020-08-08 MED ORDER — ASPIRIN EC 81 MG PO TBEC
81.0000 mg | DELAYED_RELEASE_TABLET | Freq: Every day | ORAL | Status: DC
  Administered 2020-08-08 – 2020-08-09 (×2): 81 mg via ORAL
  Filled 2020-08-08 (×2): qty 1

## 2020-08-08 MED ORDER — ENOXAPARIN SODIUM 40 MG/0.4ML ~~LOC~~ SOLN
40.0000 mg | SUBCUTANEOUS | Status: DC
  Administered 2020-08-08: 40 mg via SUBCUTANEOUS
  Filled 2020-08-08: qty 0.4

## 2020-08-08 MED ORDER — SODIUM CHLORIDE 0.9 % IV SOLN
500.0000 mg | Freq: Once | INTRAVENOUS | Status: AC
  Administered 2020-08-08: 500 mg via INTRAVENOUS
  Filled 2020-08-08: qty 500

## 2020-08-08 MED ORDER — ONDANSETRON HCL 4 MG/2ML IJ SOLN: 4.0000 mg | Freq: Four times a day (QID) | INTRAMUSCULAR | Status: DC | PRN

## 2020-08-08 MED ORDER — SODIUM CHLORIDE 0.9 % IV SOLN
500.0000 mg | INTRAVENOUS | Status: DC
  Filled 2020-08-08: qty 500

## 2020-08-08 MED ORDER — IOHEXOL 350 MG/ML SOLN
75.0000 mL | Freq: Once | INTRAVENOUS | Status: AC | PRN
  Administered 2020-08-08: 75 mL via INTRAVENOUS

## 2020-08-08 MED ORDER — AMLODIPINE BESYLATE 5 MG PO TABS
5.0000 mg | ORAL_TABLET | Freq: Every day | ORAL | Status: DC
  Administered 2020-08-09: 5 mg via ORAL
  Filled 2020-08-08: qty 1

## 2020-08-08 MED ORDER — VANCOMYCIN HCL 1750 MG/350ML IV SOLN
1750.0000 mg | Freq: Once | INTRAVENOUS | Status: AC
  Administered 2020-08-08: 1750 mg via INTRAVENOUS
  Filled 2020-08-08: qty 350

## 2020-08-08 MED ORDER — SODIUM CHLORIDE 0.9 % IV SOLN: 500.0000 mg | INTRAVENOUS | Status: DC

## 2020-08-08 MED ORDER — CLONIDINE HCL 0.1 MG PO TABS
0.1000 mg | ORAL_TABLET | Freq: Three times a day (TID) | ORAL | Status: DC
  Administered 2020-08-08 – 2020-08-09 (×3): 0.1 mg via ORAL
  Filled 2020-08-08 (×3): qty 1

## 2020-08-08 MED ORDER — ONDANSETRON HCL 4 MG PO TABS: 4.0000 mg | ORAL_TABLET | Freq: Four times a day (QID) | ORAL | Status: DC | PRN

## 2020-08-08 MED ORDER — VANCOMYCIN HCL IN DEXTROSE 1-5 GM/200ML-% IV SOLN
1000.0000 mg | Freq: Two times a day (BID) | INTRAVENOUS | Status: DC
  Administered 2020-08-09: 1000 mg via INTRAVENOUS
  Filled 2020-08-08 (×3): qty 200

## 2020-08-08 MED ORDER — OXYCODONE HCL ER 15 MG PO T12A
15.0000 mg | EXTENDED_RELEASE_TABLET | Freq: Two times a day (BID) | ORAL | Status: DC
Start: 2020-08-08 — End: 2020-08-08

## 2020-08-08 MED ORDER — SODIUM CHLORIDE 0.9 % IV SOLN
2.0000 g | Freq: Three times a day (TID) | INTRAVENOUS | Status: DC
  Administered 2020-08-08 – 2020-08-09 (×2): 2 g via INTRAVENOUS
  Filled 2020-08-08 (×4): qty 2

## 2020-08-08 MED ORDER — SODIUM CHLORIDE 0.9 % IV SOLN
100.0000 mg | Freq: Two times a day (BID) | INTRAVENOUS | Status: DC
  Administered 2020-08-09: 100 mg via INTRAVENOUS
  Filled 2020-08-08 (×2): qty 100

## 2020-08-08 MED ORDER — SODIUM CHLORIDE 0.9 % IV BOLUS (SEPSIS)
1000.0000 mL | Freq: Once | INTRAVENOUS | Status: AC
  Administered 2020-08-08: 1000 mL via INTRAVENOUS

## 2020-08-08 MED ORDER — ATORVASTATIN CALCIUM 20 MG PO TABS
40.0000 mg | ORAL_TABLET | Freq: Every day | ORAL | Status: DC
  Administered 2020-08-08: 40 mg via ORAL
  Filled 2020-08-08: qty 2

## 2020-08-08 MED ORDER — CARVEDILOL 3.125 MG PO TABS
3.1250 mg | ORAL_TABLET | Freq: Two times a day (BID) | ORAL | Status: DC
  Administered 2020-08-09: 3.125 mg via ORAL
  Filled 2020-08-08: qty 1

## 2020-08-08 MED ORDER — ALBUTEROL SULFATE HFA 108 (90 BASE) MCG/ACT IN AERS
2.0000 | INHALATION_SPRAY | Freq: Four times a day (QID) | RESPIRATORY_TRACT | Status: DC | PRN
  Filled 2020-08-08: qty 6.7

## 2020-08-08 MED ORDER — VANCOMYCIN HCL 10 G IV SOLR: 1750.0000 mg | Freq: Once | INTRAVENOUS | Status: DC

## 2020-08-08 NOTE — Progress Notes (Signed)
Pharmacy Antibiotic Note  Theodore Obrien is a 70 y.o. male admitted on 08/08/2020 with shortness of breath,  sepsis.  Pharmacy has been consulted for vancomycin and cefepime dosing.  Patient febrile Tmax 102.6, WBC WNL, LA 1.5, SCr appears consistent with baseline.   Plan: Vancomycin 1750mg  IV x1 Then start vancomycin 1000mg  IV q12h (estAUC 488, goal AUC 400-500) Cefepime 2g IV q8h Monitor clinical picture, renal function, vanc levels if needed pending LOT F/U C&S, abx de-escalation, LOT   Height: 6\' 1"  (185.4 cm) Weight: 78.9 kg (174 lb) IBW/kg (Calculated) : 79.9  Temp (24hrs), Avg:101.3 F (38.5 C), Min:99.9 F (37.7 C), Max:102.6 F (39.2 C)  Recent Labs  Lab 08/08/20 1352 08/08/20 1400  WBC 4.3  --   CREATININE 0.98  --   LATICACIDVEN  --  1.5    Estimated Creatinine Clearance: 81.6 mL/min (by C-G formula based on SCr of 0.98 mg/dL).    Allergies  Allergen Reactions  . Zyvox [Linezolid] Other (See Comments)    Nerve damage (neuropathy) Retinal hemmorhage    Antimicrobials this admission: CTX 4/9 x1 azith 4/9 x1 Cefepime 4/9> vanc 4/9>  Microbiology results: 4/9 BCx in process 4/9 UCx in process 4/9 covid negative   Brendolyn Patty, PharmD Clinical Pharmacist  08/08/2020   5:32 PM
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
## 2020-08-08 NOTE — Progress Notes (Signed)
CODE SEPSIS - PHARMACY COMMUNICATION  **Broad Spectrum Antibiotics should be administered within 1 hour of Sepsis diagnosis**  Time Code Sepsis Called/Page Received: 1400  Antibiotics Ordered: Ceftriaxone + azithromycin  Time of 1st antibiotic administration: 1423  Additional action taken by pharmacy: N/A   Benita Gutter  08/08/2020  2:02 PM

## 2020-08-08 NOTE — ED Provider Notes (Signed)
Procedures     ----------------------------------------- 4:53 PM on 08/08/2020 -----------------------------------------  CT scan unremarkable, no PE or other acute findings.  Tachycardia improved.  Patient remains hypoxic, requiring 2 L nasal cannula to maintain oxygen saturation of about 92%.  CT scan suggest possible gallbladder inflammation.  However, patient denies any right upper quadrant pain and is completely nontender on exam.    Carrie Mew, MD 08/08/20 1654

## 2020-08-08 NOTE — Progress Notes (Signed)
Patient has refused cpap. He states he does not know what such is and has never been told he needs to wear a mask at night for sleep purposes. Patient has refused

## 2020-08-08 NOTE — ED Triage Notes (Signed)
Reported via EMS for SOB, Leg and back pain x 1 hour.

## 2020-08-08 NOTE — ED Provider Notes (Signed)
[facility] Emergency Department Provider Note   ____________________________________________    I have reviewed the triage vital signs and the nursing notes.   HISTORY  Chief Complaint Shortness of Breath     HPI Theodore Obrien is a 70 y.o. male with history of CHF, COPD, hypertension who presents with shortness of breath.  Patient reports he was sitting on his couch watching TV when he developed some shortness of breath and cough.  He reports yesterday he was feeling fine.  Does not know if he has had any fevers.  Has not take anything for this.  No nausea or vomiting or abdominal pain.  No extremity swelling reported.  Past Medical History:  Diagnosis Date  . CHF (congestive heart failure) ([place])   . COPD (chronic obstructive pulmonary disease) ([place])   . Dyspnea   . GERD (gastroesophageal reflux disease)   . History of kidney stones   . Hypertension   . Myocardial infarction [facility]) 2020    Patient Active Problem List   Diagnosis Date Noted  . Malnutrition of moderate degree 09/18/2019  . Acute cardiogenic pulmonary edema ([place]) 09/17/2019  . Hypertensive urgency 08/28/2019  . CAD (coronary artery disease) 08/28/2019  . Acute respiratory failure with hypoxia ([place]) 12/27/2018  . Anxiety state 07/23/2015  . Benign essential HTN 07/23/2015  . Drug-induced peripheral neuropathy ([place]) 07/23/2015  . Opioid dependence with withdrawal ([place])   . Opiate dependence ([place]) 03/23/2015  . Hernia of abdominal cavity 12/09/2014    Past Surgical History:  Procedure Laterality Date  . CARPAL TUNNEL RELEASE Left   . CORONARY STENT INTERVENTION N/A 12/31/2018   Procedure: CORONARY STENT INTERVENTION;  Surgeon: Yolonda Kida, MD;  Location: [place] CV LAB;  Service: Cardiovascular;  Laterality: N/A;  . FEMORAL HERNIA REPAIR Left 10/27/2014   Procedure: Repair of left femoral and direct hernia with mesh ;  Surgeon: Sherri Rad, MD;  Location: [facility] ORS;   Service: General;  Laterality: Left;  . FRACTURE SURGERY     right leg  . LEFT HEART CATH AND CORONARY ANGIOGRAPHY N/A 12/31/2018   Procedure: LEFT HEART CATH AND CORONARY ANGIOGRAPHY;  Surgeon: Corey Skains, MD;  Location: [place] CV LAB;  Service: Cardiovascular;  Laterality: N/A;  . XI ROBOTIC ASSISTED INGUINAL HERNIA REPAIR WITH MESH Bilateral 08/28/2019   Procedure: XI ROBOTIC ASSISTED INGUINAL HERNIA REPAIR WITH MESH;  Surgeon: Herbert Pun, MD;  Location: [facility] ORS;  Service: General;  Laterality: Bilateral;    Prior to Admission medications   Medication Sig Start Date End Date Taking? Authorizing Provider  albuterol (PROVENTIL HFA) 108 (90 Base) MCG/ACT inhaler Inhale 2 puffs into the lungs every 6 (six) hours as needed for wheezing or shortness of breath.     [provider]  ALPRAZolam Duanne Moron) 0.25 MG tablet Take 0.25 mg by mouth 2 (two) times daily as needed for anxiety or sleep.  08/03/17   [provider]  amLODipine (NORVASC) 5 MG tablet Take 5 mg by mouth daily.     [provider]  aspirin EC 81 MG EC tablet Take 1 tablet (81 mg total) by mouth daily. 01/02/19   Stark Jock Jude, MD  atorvastatin (LIPITOR) 40 MG tablet Take 40 mg by mouth daily.    [provider]  carvedilol (COREG) 3.125 MG tablet Take 1 tablet (3.125 mg total) by mouth 2 (two) times daily with a meal. Hold for heart rate less than 60. 01/01/19   Otila Back, MD  cloNIDine (CATAPRES) 0.1 MG tablet Take 0.1 mg by mouth 3 (three) times daily.  06/19/17   [provider]  clopidogrel (PLAVIX) 75 MG tablet Take 75 mg by mouth daily.    [provider]  lisinopril (ZESTRIL) 2.5 MG tablet Take 1 tablet (2.5 mg total) by mouth daily. 09/19/19 10/19/19  Sidney Ace, MD  oxyCODONE-acetaminophen (PERCOCET) 7.5-325 MG tablet Take 1 tablet by mouth every 4 (four) hours as needed for severe pain. Patient not taking: Reported on 09/26/2019 08/29/19 08/28/20   Herbert Pun, MD     Allergies Zyvox [linezolid]  Family History  Problem Relation Age of Onset  . Hypertension Mother   . Heart disease Mother   . Prostate cancer Father   . Leukemia Father     Social History Social History   Tobacco Use  . Smoking status: Former Smoker    Packs/day: 1.00    Years: 6.00    Pack years: 6.00    Types: Cigarettes    Quit date: 2018    Years since quitting: 4.2  . Smokeless tobacco: Never Used  . Tobacco comment: quit smoking 2018  Vaping Use  . Vaping Use: Never used  Substance Use Topics  . Alcohol use: Yes    Comment: occasionally beer  . 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    Review of Systems  Constitutional: No fever/chills Eyes: No visual changes.  ENT: No sore throat. Cardiovascular: Denies chest pain. Respiratory: As above Gastrointestinal: No abdominal pain.  Genitourinary: Negative for dysuria. Musculoskeletal: Mild back pain Skin: Negative for rash. Neurological: Negative for headaches   ____________________________________________   PHYSICAL EXAM:  VITAL SIGNS: ED Triage Vitals  Enc Vitals Group     BP 08/08/20 1337 (!) 185/103     Pulse Rate 08/08/20 1337 (!) 117     Resp 08/08/20 1337 (!) 23     Temp 08/08/20 1352 (!) 102.6 F (39.2 C)     Temp Source 08/08/20 1337 Oral     SpO2 08/08/20 1337 91 %     Weight 08/08/20 1339 78.9 kg (174 lb)     Height 08/08/20 1339 1.854 m (6\' 1" )     Head Circumference --      Peak Flow --      Pain Score 08/08/20 1339 6     Pain Loc --      Pain Edu? --      Excl. in [place]? --     Constitutional: Alert and oriented. Eyes: Conjunctivae are normal.  Head: Atraumatic. Nose: No congestion/rhinnorhea. Mouth/Throat: Mucous membranes are moist.    Cardiovascular: Tachycardia, regular rhythm. Grossly normal heart sounds.  Good peripheral circulation. Respiratory: Increased respiratory effort with tachypnea, bibasilar Rales Gastrointestinal: Soft  and nontender. No distention.    Musculoskeletal: No lower extremity tenderness nor edema.  Warm and well perfused Neurologic:  Normal speech and language. No gross focal neurologic deficits are appreciated.  Skin:  Skin is warm, dry and intact. No rash noted. Psychiatric: Mood and affect are normal. Speech and behavior are normal.  ____________________________________________   LABS (all labs ordered are listed, but only abnormal results are displayed)  Labs Reviewed  COMPREHENSIVE METABOLIC PANEL - Abnormal; Notable for the following components:      Result Value   Glucose, Bld 106 (*)    Calcium 8.8 (*)    Alkaline Phosphatase 176 (*)    Total Bilirubin 3.6 (*)    All other  components within normal limits  CBC WITH DIFFERENTIAL/PLATELET - Abnormal; Notable for the following components:   Platelets 126 (*)    Lymphs Abs 0.2 (*)    Monocytes Absolute 0.0 (*)    All other components within normal limits  URINALYSIS, COMPLETE (UACMP) WITH MICROSCOPIC - Abnormal; Notable for the following components:   Color, Urine STRAW (*)    APPearance CLEAR (*)    Glucose, UA 50 (*)    Hgb urine dipstick SMALL (*)    All other components within normal limits  RESP PANEL BY RT-PCR (FLU A&B, COVID) ARPGX2  CULTURE, BLOOD (ROUTINE X 2)  CULTURE, BLOOD (ROUTINE X 2)  URINE CULTURE  LACTIC ACID, PLASMA  PROTIME-INR  APTT  LACTIC ACID, PLASMA   ____________________________________________  EKG  ED ECG REPORT I, Lavonia Drafts, the attending physician, personally viewed and interpreted this ECG.  Date: 08/08/2020  Rhythm: Sinus tachycardia QRS Axis: normal Intervals: normal ST/T Wave abnormalities: normal Narrative Interpretation: no evidence of acute ischemia  ____________________________________________  RADIOLOGY  Chest x-ray viewed by me, question left-sided infiltrate ____________________________________________   PROCEDURES  Procedure(s) performed:  No  Procedures   Critical Care performed: yes  CRITICAL CARE Performed by: Lavonia Drafts   Total critical care time: 30 minutes  Critical care time was exclusive of separately billable procedures and treating other patients.  Critical care was necessary to treat or prevent imminent or life-threatening deterioration.  Critical care was time spent personally by me on the following activities: development of treatment plan with patient and/or surrogate as well as nursing, discussions with consultants, evaluation of patient's response to treatment, examination of patient, obtaining history from patient or surrogate, ordering and performing treatments and interventions, ordering and review of laboratory studies, ordering and review of radiographic studies, pulse oximetry and re-evaluation of patient's condition.  ____________________________________________   INITIAL IMPRESSION / ASSESSMENT AND PLAN / ED COURSE  Pertinent labs & imaging results that were available during my care of the patient were reviewed by me and considered in my medical decision making (see chart for details).  Patient presents with shortness of breath as described above.  Found to be hypotensive, febrile, tachycardic and tachypneic here in the emergency department as well as hypoxic.  On 2 L nasal cannula patient's oxygen saturations are between 90 and 93%.  Does sound like his shortness of breath is relatively acute in onset however he is febrile suggesting infectious etiology as opposed to edema.  Differential does include COVID-19, pneumonia, sepsis.  Patient does have a history of IV drug abuse, myocarditis is on the differential as well.  IV fluids infusing, code sepsis called, Rocephin and azithromycin ordered for presumed pneumonia.  Covid test pending.  CXR over all reassuring. Will send for CTA. Have asked Dr. Joni Fears to follow up, will need admission     ____________________________________________   FINAL CLINICAL IMPRESSION(S) / ED DIAGNOSES  Final diagnoses:  Acute respiratory failure with hypoxia [facility])        Note:  This document was prepared using Dragon voice recognition software and may include unintentional dictation errors.   Lavonia Drafts, MD 08/08/20 [phone]

## 2020-08-09 DIAGNOSIS — J189 Pneumonia, unspecified organism: Secondary | ICD-10-CM | POA: Diagnosis present

## 2020-08-09 DIAGNOSIS — I712 Thoracic aortic aneurysm, without rupture: Secondary | ICD-10-CM | POA: Diagnosis present

## 2020-08-09 DIAGNOSIS — G8929 Other chronic pain: Secondary | ICD-10-CM | POA: Diagnosis present

## 2020-08-09 DIAGNOSIS — F1910 Other psychoactive substance abuse, uncomplicated: Secondary | ICD-10-CM | POA: Diagnosis not present

## 2020-08-09 DIAGNOSIS — Z5329 Procedure and treatment not carried out because of patient's decision for other reasons: Secondary | ICD-10-CM | POA: Diagnosis present

## 2020-08-09 DIAGNOSIS — Z79899 Other long term (current) drug therapy: Secondary | ICD-10-CM | POA: Diagnosis not present

## 2020-08-09 DIAGNOSIS — J9601 Acute respiratory failure with hypoxia: Secondary | ICD-10-CM | POA: Diagnosis present

## 2020-08-09 DIAGNOSIS — Z7982 Long term (current) use of aspirin: Secondary | ICD-10-CM | POA: Diagnosis not present

## 2020-08-09 DIAGNOSIS — A419 Sepsis, unspecified organism: Secondary | ICD-10-CM | POA: Diagnosis present

## 2020-08-09 DIAGNOSIS — E440 Moderate protein-calorie malnutrition: Secondary | ICD-10-CM | POA: Diagnosis present

## 2020-08-09 DIAGNOSIS — K219 Gastro-esophageal reflux disease without esophagitis: Secondary | ICD-10-CM | POA: Diagnosis present

## 2020-08-09 DIAGNOSIS — I272 Pulmonary hypertension, unspecified: Secondary | ICD-10-CM | POA: Diagnosis present

## 2020-08-09 DIAGNOSIS — I110 Hypertensive heart disease with heart failure: Secondary | ICD-10-CM | POA: Diagnosis present

## 2020-08-09 DIAGNOSIS — I252 Old myocardial infarction: Secondary | ICD-10-CM | POA: Diagnosis not present

## 2020-08-09 DIAGNOSIS — Z20822 Contact with and (suspected) exposure to covid-19: Secondary | ICD-10-CM | POA: Diagnosis present

## 2020-08-09 DIAGNOSIS — Z7902 Long term (current) use of antithrombotics/antiplatelets: Secondary | ICD-10-CM | POA: Diagnosis not present

## 2020-08-09 DIAGNOSIS — R0602 Shortness of breath: Secondary | ICD-10-CM | POA: Diagnosis present

## 2020-08-09 DIAGNOSIS — F1120 Opioid dependence, uncomplicated: Secondary | ICD-10-CM | POA: Diagnosis present

## 2020-08-09 DIAGNOSIS — I509 Heart failure, unspecified: Secondary | ICD-10-CM | POA: Diagnosis present

## 2020-08-09 DIAGNOSIS — I2510 Atherosclerotic heart disease of native coronary artery without angina pectoris: Secondary | ICD-10-CM | POA: Diagnosis present

## 2020-08-09 DIAGNOSIS — Z9861 Coronary angioplasty status: Secondary | ICD-10-CM | POA: Diagnosis not present

## 2020-08-09 DIAGNOSIS — F1123 Opioid dependence with withdrawal: Secondary | ICD-10-CM | POA: Diagnosis present

## 2020-08-09 DIAGNOSIS — F411 Generalized anxiety disorder: Secondary | ICD-10-CM | POA: Diagnosis present

## 2020-08-09 DIAGNOSIS — R7989 Other specified abnormal findings of blood chemistry: Secondary | ICD-10-CM | POA: Diagnosis present

## 2020-08-09 DIAGNOSIS — J440 Chronic obstructive pulmonary disease with acute lower respiratory infection: Secondary | ICD-10-CM | POA: Diagnosis present

## 2020-08-09 DIAGNOSIS — E785 Hyperlipidemia, unspecified: Secondary | ICD-10-CM | POA: Diagnosis present

## 2020-08-09 LAB — BASIC METABOLIC PANEL
Anion gap: 6 (ref 5–15)
BUN: 16 mg/dL (ref 8–23)
CO2: 27 mmol/L (ref 22–32)
Calcium: 8.4 mg/dL — ABNORMAL LOW (ref 8.9–10.3)
Chloride: 103 mmol/L (ref 98–111)
Creatinine, Ser: 0.92 mg/dL (ref 0.61–1.24)
GFR, Estimated: >60 mL/min (ref >60)
Glucose, Bld: 103 mg/dL — ABNORMAL HIGH (ref 70–99)
Potassium: 4.3 mmol/L (ref 3.5–5.1)
Sodium: 136 mmol/L (ref 135–145)

## 2020-08-09 LAB — HIV ANTIBODY (ROUTINE TESTING W REFLEX): HIV Screen 4th Generation wRfx: NONREACTIVE

## 2020-08-09 LAB — PROTIME-INR
INR: 1.2 (ref 0.8–1.2)
Prothrombin Time: 15 seconds (ref 11.4–15.2)

## 2020-08-09 LAB — MRSA PCR SCREENING: MRSA by PCR: NEGATIVE

## 2020-08-09 MED ORDER — SODIUM CHLORIDE 0.9 % IV SOLN
500.0000 mg | INTRAVENOUS | Status: DC
  Filled 2020-08-09: qty 500

## 2020-08-09 MED ORDER — SODIUM CHLORIDE 0.9 % IV SOLN
2.0000 g | INTRAVENOUS | Status: DC
  Filled 2020-08-09: qty 20

## 2020-08-09 NOTE — Progress Notes (Signed)
Patient requested to leave. Dr. Posey Pronto called and informed him it would be AMA, patient agreeable. Writer asked patient if he would be willing to stay until 1900 to complete three IV abx, patient declined. Signed AMA form in chart, patient ambulated self to medical mall where friend was picking him up; did not wish to wait for wheelchair at this time.
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
## 2020-08-10 LAB — URINE CULTURE: Culture: <10000 — AB

## 2020-08-13 LAB — CULTURE, BLOOD (ROUTINE X 2)
Culture: NO GROWTH
Culture: NO GROWTH
Special Requests: ADEQUATE
Special Requests: ADEQUATE

## 2020-09-02 NOTE — Discharge Summary (Signed)
Patient left AMA on 08/09/2020 patient was admitted with sepsis suspected due to pneumonia in the setting of IV drug abuse. He was started on IV antibiotics. Extensive counseling was done for IV drug abuse. Patient decided he wanted to leave AMA despite discussion with him to finish up his treatment. He left AMA.  Discharge diagnosis and medication list will not be dictated since patient left AMA.

## 2021-06-23 ENCOUNTER — Other Ambulatory Visit: Payer: Self-pay | Admitting: Internal Medicine

## 2021-06-23 ENCOUNTER — Other Ambulatory Visit: Payer: Self-pay

## 2021-06-23 ENCOUNTER — Ambulatory Visit
Admission: RE | Admit: 2021-06-23 | Discharge: 2021-06-23 | Disposition: A | Discharge status: Home / Self Care | Payer: Medicare HMO | Source: Ambulatory Visit | Attending: Internal Medicine | Admitting: Internal Medicine

## 2021-06-23 DIAGNOSIS — M7989 Other specified soft tissue disorders: Secondary | ICD-10-CM | POA: Diagnosis present

## 2021-06-23 DIAGNOSIS — M79662 Pain in left lower leg: Secondary | ICD-10-CM

## 2021-10-10 ENCOUNTER — Observation Stay
Admission: EM | Admit: 2021-10-10 | Discharge: 2021-10-11 | Disposition: A | Discharge status: Home / Self Care | Payer: Medicare HMO | Attending: Family Medicine | Admitting: Family Medicine

## 2021-10-10 ENCOUNTER — Emergency Department: Payer: Medicare HMO

## 2021-10-10 DIAGNOSIS — Z87891 Personal history of nicotine dependence: Secondary | ICD-10-CM | POA: Insufficient documentation

## 2021-10-10 DIAGNOSIS — Y900 Blood alcohol level of less than 20 mg/100 ml: Secondary | ICD-10-CM | POA: Diagnosis not present

## 2021-10-10 DIAGNOSIS — Z955 Presence of coronary angioplasty implant and graft: Secondary | ICD-10-CM | POA: Insufficient documentation

## 2021-10-10 DIAGNOSIS — I509 Heart failure, unspecified: Secondary | ICD-10-CM | POA: Diagnosis not present

## 2021-10-10 DIAGNOSIS — R0789 Other chest pain: Secondary | ICD-10-CM | POA: Diagnosis not present

## 2021-10-10 DIAGNOSIS — I110 Hypertensive heart disease with heart failure: Secondary | ICD-10-CM | POA: Diagnosis not present

## 2021-10-10 DIAGNOSIS — Z79899 Other long term (current) drug therapy: Secondary | ICD-10-CM | POA: Insufficient documentation

## 2021-10-10 DIAGNOSIS — D72829 Elevated white blood cell count, unspecified: Secondary | ICD-10-CM | POA: Diagnosis not present

## 2021-10-10 DIAGNOSIS — N179 Acute kidney failure, unspecified: Secondary | ICD-10-CM | POA: Diagnosis not present

## 2021-10-10 DIAGNOSIS — N200 Calculus of kidney: Secondary | ICD-10-CM | POA: Insufficient documentation

## 2021-10-10 DIAGNOSIS — I493 Ventricular premature depolarization: Secondary | ICD-10-CM | POA: Diagnosis not present

## 2021-10-10 DIAGNOSIS — K219 Gastro-esophageal reflux disease without esophagitis: Secondary | ICD-10-CM | POA: Diagnosis not present

## 2021-10-10 DIAGNOSIS — I452 Bifascicular block: Secondary | ICD-10-CM | POA: Insufficient documentation

## 2021-10-10 DIAGNOSIS — R109 Unspecified abdominal pain: Secondary | ICD-10-CM | POA: Diagnosis not present

## 2021-10-10 DIAGNOSIS — R8290 Unspecified abnormal findings in urine: Secondary | ICD-10-CM | POA: Diagnosis not present

## 2021-10-10 DIAGNOSIS — F1120 Opioid dependence, uncomplicated: Secondary | ICD-10-CM | POA: Diagnosis not present

## 2021-10-10 DIAGNOSIS — R7989 Other specified abnormal findings of blood chemistry: Secondary | ICD-10-CM | POA: Insufficient documentation

## 2021-10-10 DIAGNOSIS — J449 Chronic obstructive pulmonary disease, unspecified: Secondary | ICD-10-CM | POA: Diagnosis not present

## 2021-10-10 DIAGNOSIS — Z8249 Family history of ischemic heart disease and other diseases of the circulatory system: Secondary | ICD-10-CM | POA: Insufficient documentation

## 2021-10-10 DIAGNOSIS — I252 Old myocardial infarction: Secondary | ICD-10-CM | POA: Diagnosis not present

## 2021-10-10 DIAGNOSIS — E785 Hyperlipidemia, unspecified: Secondary | ICD-10-CM | POA: Diagnosis not present

## 2021-10-10 DIAGNOSIS — F1410 Cocaine abuse, uncomplicated: Secondary | ICD-10-CM | POA: Diagnosis not present

## 2021-10-10 DIAGNOSIS — J988 Other specified respiratory disorders: Secondary | ICD-10-CM | POA: Insufficient documentation

## 2021-10-10 DIAGNOSIS — I700 Atherosclerosis of aorta: Secondary | ICD-10-CM | POA: Insufficient documentation

## 2021-10-10 DIAGNOSIS — R197 Diarrhea, unspecified: Secondary | ICD-10-CM | POA: Insufficient documentation

## 2021-10-10 DIAGNOSIS — I10 Essential (primary) hypertension: Secondary | ICD-10-CM | POA: Diagnosis present

## 2021-10-10 DIAGNOSIS — Z20822 Contact with and (suspected) exposure to covid-19: Secondary | ICD-10-CM | POA: Insufficient documentation

## 2021-10-10 DIAGNOSIS — I2510 Atherosclerotic heart disease of native coronary artery without angina pectoris: Secondary | ICD-10-CM | POA: Insufficient documentation

## 2021-10-10 DIAGNOSIS — F1910 Other psychoactive substance abuse, uncomplicated: Secondary | ICD-10-CM | POA: Diagnosis not present

## 2021-10-10 DIAGNOSIS — R112 Nausea with vomiting, unspecified: Secondary | ICD-10-CM | POA: Diagnosis not present

## 2021-10-10 DIAGNOSIS — R079 Chest pain, unspecified: Secondary | ICD-10-CM | POA: Diagnosis present

## 2021-10-10 DIAGNOSIS — F1290 Cannabis use, unspecified, uncomplicated: Secondary | ICD-10-CM | POA: Insufficient documentation

## 2021-10-10 DIAGNOSIS — I712 Thoracic aortic aneurysm, without rupture, unspecified: Secondary | ICD-10-CM | POA: Insufficient documentation

## 2021-10-10 DIAGNOSIS — R0602 Shortness of breath: Secondary | ICD-10-CM | POA: Insufficient documentation

## 2021-10-10 DIAGNOSIS — K5730 Diverticulosis of large intestine without perforation or abscess without bleeding: Secondary | ICD-10-CM | POA: Insufficient documentation

## 2021-10-10 DIAGNOSIS — R509 Fever, unspecified: Secondary | ICD-10-CM | POA: Insufficient documentation

## 2021-10-10 LAB — CBC WITH DIFFERENTIAL/PLATELET
Abs Immature Granulocytes: 0.07 10*3/uL (ref 0.00–0.07)
Basophils Absolute: 0 10*3/uL (ref 0.0–0.1)
Basophils Relative: 0 %
Eosinophils Absolute: 0 10*3/uL (ref 0.0–0.5)
Eosinophils Relative: 0 %
HCT: 51.2 % (ref 39.0–52.0)
Hemoglobin: 16.8 g/dL (ref 13.0–17.0)
Immature Granulocytes: 1 %
Lymphocytes Relative: 10 %
Lymphs Abs: 1.3 10*3/uL (ref 0.7–4.0)
MCH: 27.9 pg (ref 26.0–34.0)
MCHC: 32.8 g/dL (ref 30.0–36.0)
MCV: 84.9 fL (ref 80.0–100.0)
Monocytes Absolute: 1.4 10*3/uL — ABNORMAL HIGH (ref 0.1–1.0)
Monocytes Relative: 10 %
Neutro Abs: 10.6 10*3/uL — ABNORMAL HIGH (ref 1.7–7.7)
Neutrophils Relative %: 79 %
Platelets: 187 10*3/uL (ref 150–400)
RBC: 6.03 MIL/uL — ABNORMAL HIGH (ref 4.22–5.81)
RDW: 14 % (ref 11.5–15.5)
WBC: 13.4 10*3/uL — ABNORMAL HIGH (ref 4.0–10.5)
nRBC: 0 % (ref 0.0–0.2)

## 2021-10-10 LAB — COMPREHENSIVE METABOLIC PANEL
ALT: 18 U/L (ref 0–44)
AST: 18 U/L (ref 15–41)
Albumin: 3.9 g/dL (ref 3.5–5.0)
Alkaline Phosphatase: 64 U/L (ref 38–126)
Anion gap: 13 (ref 5–15)
BUN: 74 mg/dL — ABNORMAL HIGH (ref 8–23)
CO2: 26 mmol/L (ref 22–32)
Calcium: 8.4 mg/dL — ABNORMAL LOW (ref 8.9–10.3)
Chloride: 93 mmol/L — ABNORMAL LOW (ref 98–111)
Creatinine, Ser: 2.26 mg/dL — ABNORMAL HIGH (ref 0.61–1.24)
GFR, Estimated: 31 mL/min — ABNORMAL LOW (ref >60)
Glucose, Bld: 113 mg/dL — ABNORMAL HIGH (ref 70–99)
Potassium: 3.7 mmol/L (ref 3.5–5.1)
Sodium: 132 mmol/L — ABNORMAL LOW (ref 135–145)
Total Bilirubin: 3.4 mg/dL — ABNORMAL HIGH (ref 0.3–1.2)
Total Protein: 7.4 g/dL (ref 6.5–8.1)

## 2021-10-10 LAB — RESP PANEL BY RT-PCR (FLU A&B, COVID) ARPGX2
Influenza A by PCR: NEGATIVE
Influenza B by PCR: NEGATIVE
SARS Coronavirus 2 by RT PCR: NEGATIVE

## 2021-10-10 LAB — LACTIC ACID, PLASMA: Lactic Acid, Venous: 1.1 mmol/L (ref 0.5–1.9)

## 2021-10-10 IMAGING — CT CT CHEST W/O CM
2 of 4 series · 14 of 36 positions shown, 17 images · non-contrast
Comparison: Chest radiograph dated [DATE] and CT dated
[DATE] and [DATE].

CLINICAL DATA: Respiratory illness. Also flank pain. Concern for
kidney stone.



[Series 2: thorax · axial · 0.83mm/px · z∈[-724,-382]mm · 11 of 203 slices shown, 14 images]
[im 16/203  mediastinal]
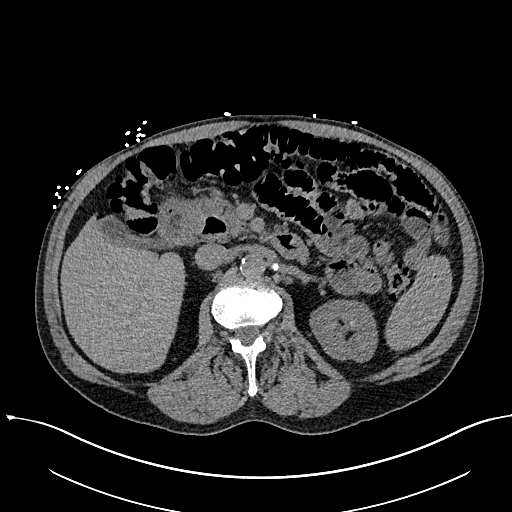
[im 16/203  lung]
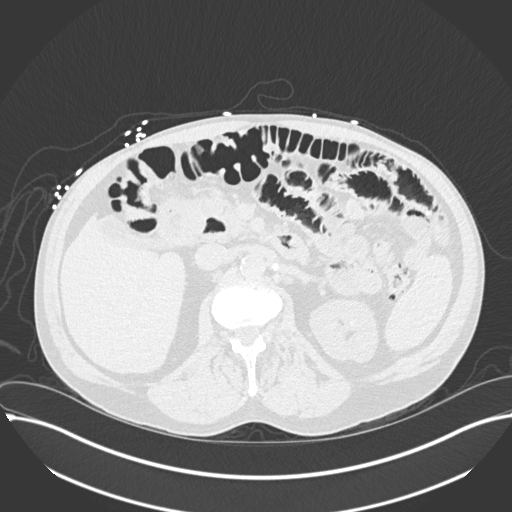
[im 32/203  lung]
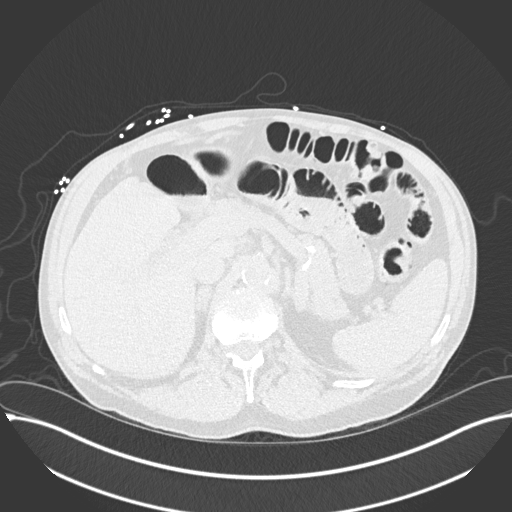
[im 47/203  lung]
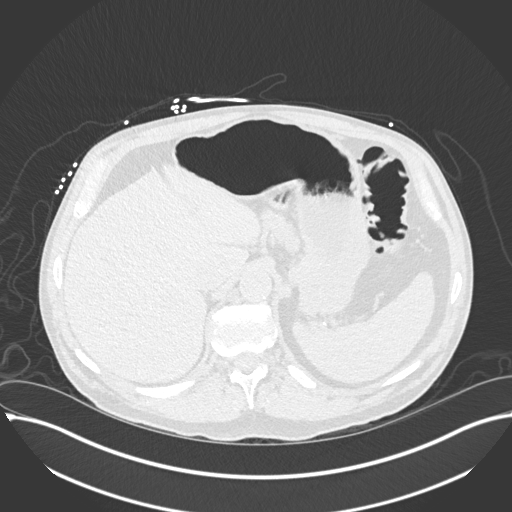
[im 63/203  lung]
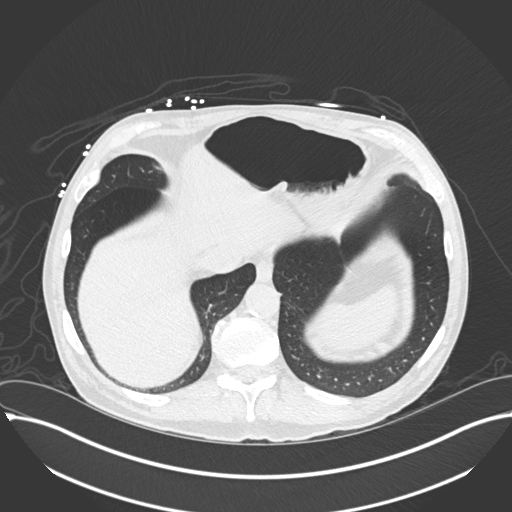
[im 78/203  mediastinal]
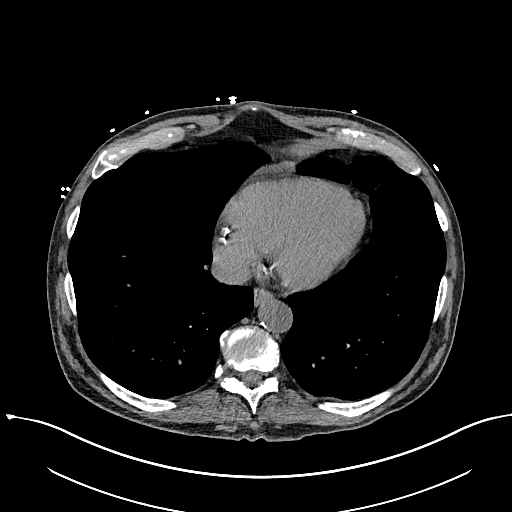
[im 78/203  lung]
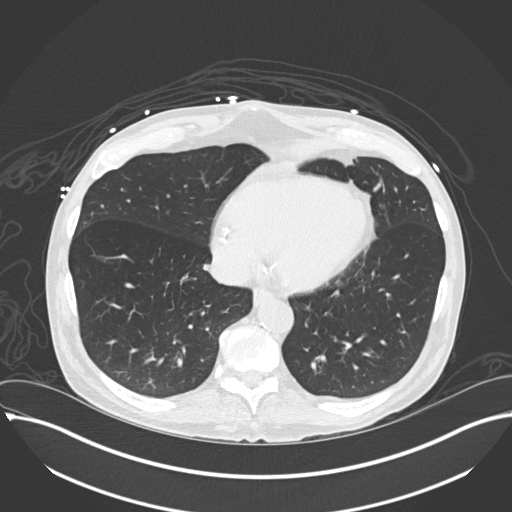
[im 109/203  lung]
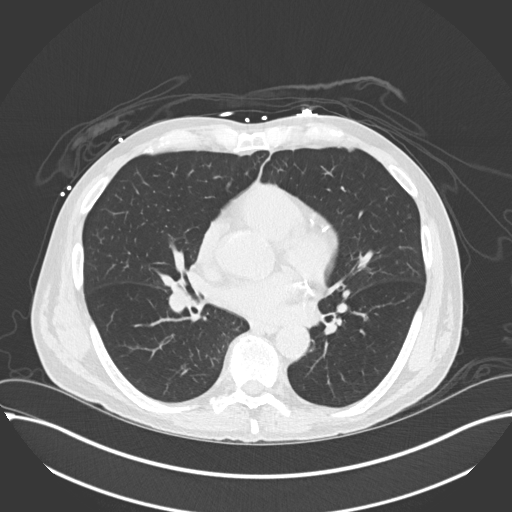
[im 125/203  lung]
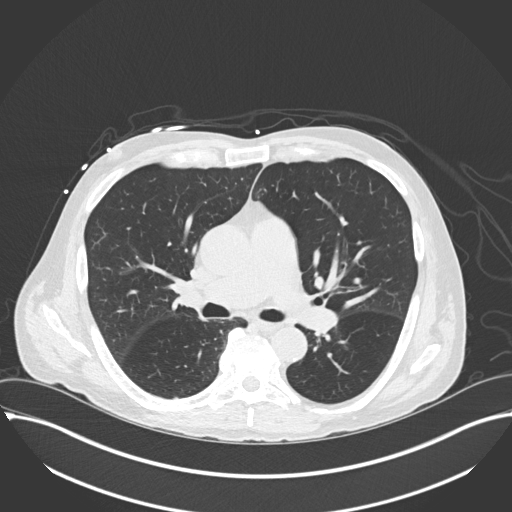
[im 140/203  lung]
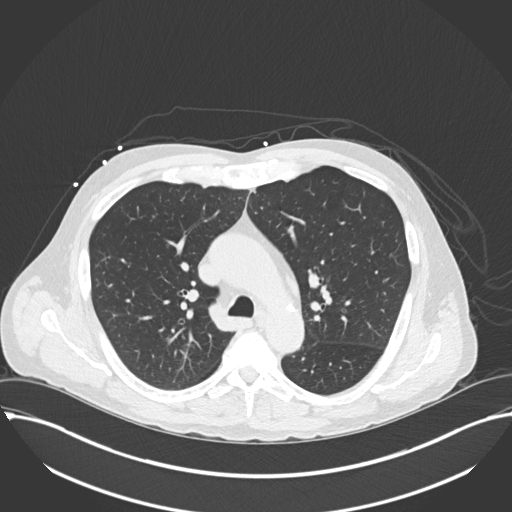
[im 156/203  mediastinal]
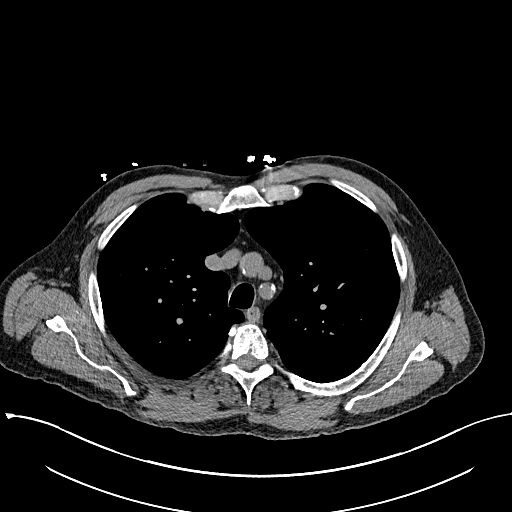
[im 156/203  lung]
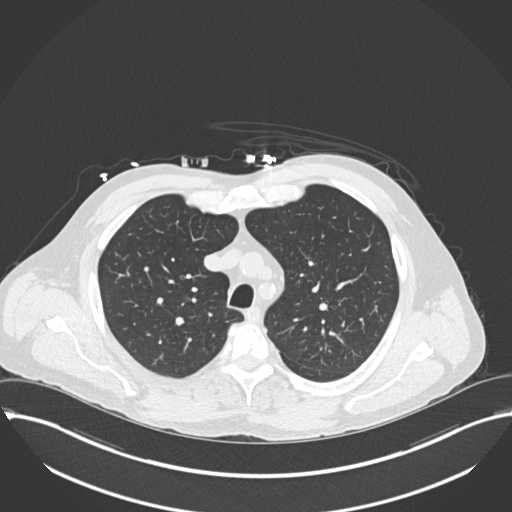
[im 171/203  lung]
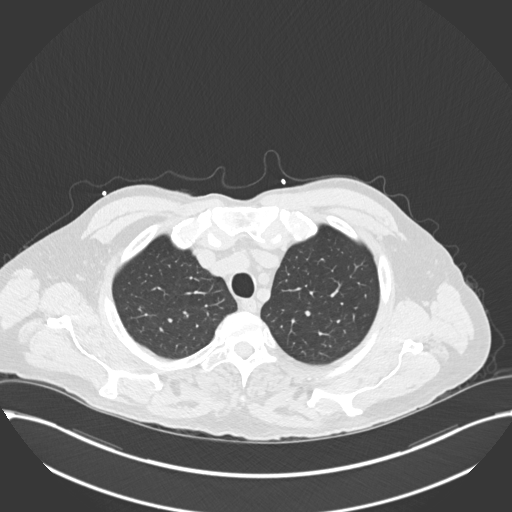
[im 187/203  lung]
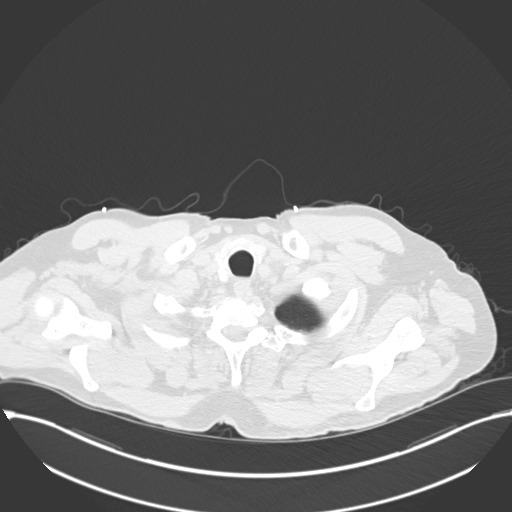

[Series 5: coronal · coronal · 0.75mm/px · 3 of 140 slices shown]
[im 28/140  lung]
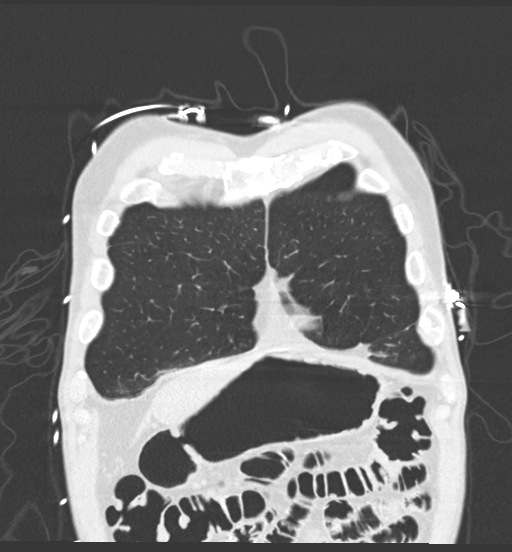
[im 56/140  lung]
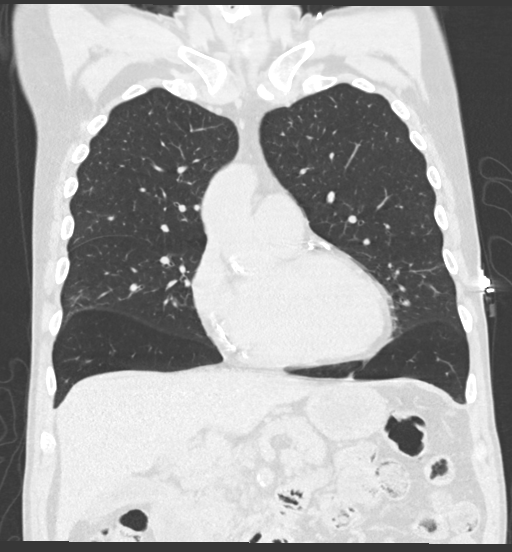
[im 84/140  lung]
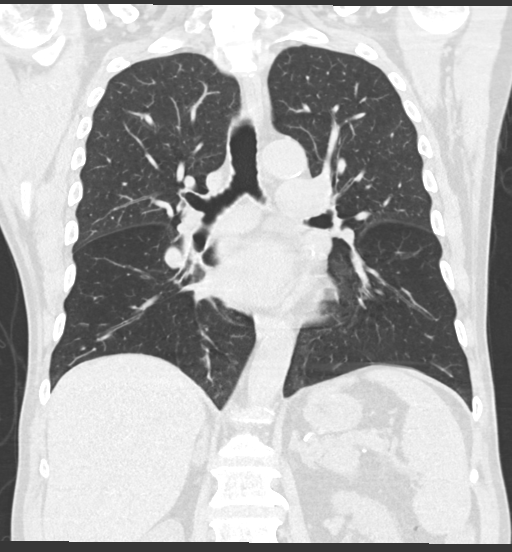

[14 of 36 positions shown; findings below may reference images not displayed]

FINDINGS: Evaluation of this exam is limited in the absence of intravenous
contrast.

CT CHEST FINDINGS

Cardiovascular: There is no cardiomegaly or pericardial effusion.
Advanced 3 vessel coronary vascular calcification. Mild
atherosclerotic calcification of the thoracic aorta. No aneurysmal
dilatation. The central pulmonary arteries are grossly unremarkable.

Mediastinum/Nodes: No hilar or mediastinal adenopathy. The esophagus
is grossly unremarkable. No mediastinal fluid collection.

Lungs/Pleura: Minimal scarring in the lingula. A 4 mm left lower
lobe subpleural nodule (139/3) similar to study of [RG]. A 4 mm
nodule along the right fissure (87/3) similar to [RG]. A 3 mm right
middle lobe nodule (131/3) similar to [RG]. No focal consolidation,
pleural effusion, pneumothorax. The central airways are patent.

Musculoskeletal: Degenerative changes of the spine. No acute osseous
pathology.

CT ABDOMEN PELVIS FINDINGS

No intra-abdominal free air or free fluid.

Hepatobiliary: No focal liver abnormality is seen. No gallstones,
gallbladder wall thickening, or biliary dilatation.

Pancreas: Unremarkable. No pancreatic ductal dilatation or
surrounding inflammatory changes.

Spleen: Normal in size without focal abnormality.

Adrenals/Urinary Tract: The adrenal glands unremarkable.
Nonobstructing bilateral renal calculi measure up to 7 mm in the
inferior pole of the left kidney. There is no hydronephrosis on
either side. The visualized ureters and urinary bladder appear
unremarkable.

Stomach/Bowel: There is a 3 cm duodenal diverticulum. There is
sigmoid diverticulosis with muscular hypertrophy. No active
inflammatory changes. There is diffuse scattered colonic
diverticula. There is no bowel obstruction or active inflammation.
The appendix is normal.

Vascular/Lymphatic: Moderate aortoiliac atherosclerotic disease. The
IVC is unremarkable. No portal venous gas. There is no adenopathy.

Reproductive: The prostate and seminal vesicles are grossly
unremarkable. No pelvic mass.

Other: None

Musculoskeletal: Multilevel degenerative changes with disc
desiccation and vacuum phenomena. No acute osseous pathology.
IMPRESSION: 1. No acute intrathoracic, abdominal, or pelvic pathology.
2. Nonobstructing bilateral renal calculi. No hydronephrosis.
3. Colonic diverticulosis. No bowel obstruction. Normal appendix.
4. Small bilateral pulmonary nodules measuring up to 4 mm similar to
study of [RG]. No follow-up needed if patient is low-risk (and has
no known or suspected primary neoplasm). Non-contrast chest CT can
be considered in 12 months if patient is high-risk. This
recommendation follows the consensus statement: Guidelines for
Management of Incidental Pulmonary Nodules Detected on CT Images:
5. Aortic Atherosclerosis ([RG]-[RG]).

## 2021-10-10 MED ORDER — ONDANSETRON HCL 4 MG/2ML IJ SOLN
4.0000 mg | Freq: Once | INTRAMUSCULAR | Status: AC
  Administered 2021-10-10: 4 mg via INTRAVENOUS
  Filled 2021-10-10: qty 2

## 2021-10-10 MED ORDER — MORPHINE SULFATE (PF) 4 MG/ML IV SOLN
4.0000 mg | Freq: Once | INTRAVENOUS | Status: AC
  Administered 2021-10-10: 4 mg via INTRAVENOUS
  Filled 2021-10-10: qty 1

## 2021-10-10 MED ORDER — SODIUM CHLORIDE 0.9 % IV BOLUS (SEPSIS)
1000.0000 mL | Freq: Once | INTRAVENOUS | Status: AC
  Administered 2021-10-10: 1000 mL via INTRAVENOUS

## 2021-10-10 NOTE — ED Provider Notes (Signed)
11:00 PM  Assumed care at shift change.

## 2021-10-10 NOTE — ED Provider Notes (Signed)
[facility] Provider Note    Event Date/Time   First MD Initiated Contact with Patient 10/10/21 2129     (approximate)  History   Chief Complaint: Chest Pain, Shortness of Breath, and Abdominal Pain (Coming from home c/o chest pain X2 days, SOB X3-4 days, abdominal pain X2 days, N/V/D, pt snorted some heroin this morning)  HPI  Theodore Obrien is a 71 y.o. male with a past medical history of CHF, COPD, gastric reflux, hypertension, substance abuse, presents to the emergency department for abdominal pain.  According to the patient over the past 2 to 3 days he has been experiencing pain in the abdomen along with nausea vomiting and diarrhea.  Patient denies any dysuria.  Patient states for the past 2 days or so he has been experiencing mild pain in his chest as well as some shortness of breath.  Denies any cough or fever.  Patient found to be satting 90% on room air and febrile to 100.3 per EMS.  Patient denies any known fever.  Patient's main complaint is central abdominal pain.  Patient does admit to snorting heroin this morning.  States he has been using intermittently but has not used IV drugs in greater than a year.  Physical Exam   Triage Vital Signs: ED Triage Vitals  Enc Vitals Group     BP 10/10/21 2136 130/79     Pulse Rate 10/10/21 2136 70     Resp 10/10/21 2136 18     Temp 10/10/21 2136 99.6 F (37.6 C)     Temp Source 10/10/21 2136 Oral     SpO2 10/10/21 2136 98 %     Weight 10/10/21 2137 180 lb (81.6 kg)     Height 10/10/21 2137 '6\' 1"'$  (1.854 m)     Head Circumference --      Peak Flow --      Pain Score 10/10/21 2137 7     Pain Loc --      Pain Edu? --      Excl. in [place]? --     Most recent vital signs: Vitals:   10/10/21 2136  BP: 130/79  Pulse: 70  Resp: 18  Temp: 99.6 F (37.6 C)  SpO2: 98%    General: Awake, no distress.  CV:  Good peripheral perfusion.  Regular rate and rhythm  Resp:  Normal effort.  Equal breath sounds  bilaterally.  No obvious wheeze rales or rhonchi Abd:  No distention.  Soft, mild diffuse tenderness with no focal tenderness identified.  No rebound or guarding.   ED Results / Procedures / Treatments   EKG  EKG viewed and interpreted by myself shows a normal sinus rhythm at 70 bpm with a widened QRS, normal axis, normal intervals, nonspecific ST changes.  Occasional PVC.  RADIOLOGY  I have personally interpreted the x-ray images, questionable opacity in the right lower lobe. Radiology is read the chest x-ray is negative.  MEDICATIONS ORDERED IN ED: Medications  sodium chloride 0.9 % bolus 1,000 mL (1,000 mLs Intravenous New Bag/Given 10/10/21 2205)     IMPRESSION / MDM / ASSESSMENT AND PLAN / ED COURSE  I reviewed the triage vital signs and the nursing notes.  Patient's presentation is most consistent with acute presentation with potential threat to life or bodily function.  Patient presents to the emergency department for abdominal pain as well as chest pain.  Patient's symptoms and differential is very broad would include intra-abdominal pathology urinary tract infection gastroenteritis, colitis  or diverticulitis, given his chest complaints and borderline hypoxia this would include pneumonia ACS pneumothorax.  Given the patient's borderline temperature we will check blood cultures labs and continue to closely monitor.  Patient's labs have resulted showing moderate leukocytosis 13,400.  Chemistry is resulted showing renal insufficiency baseline creatinine around 0.9 currently 2.26.  Patient receiving IV fluids, reassuringly lactic acid is normal at 1.1.  No obvious finding on the chest x-ray to explain the patient's borderline hypoxia.  We will obtain CT imaging of the chest abdomen and pelvis to further evaluate for any infectious etiology.  We will treat the patient's abdominal pain and nausea while awaiting results.  Urinalysis and CT scans are pending.  FINAL CLINICAL  IMPRESSION(S) / ED DIAGNOSES   Abdominal pain Chest pain   Note:  This document was prepared using Dragon voice recognition software and may include unintentional dictation errors.   Harvest Dark, MD 10/10/21 [phone]

## 2021-10-11 ENCOUNTER — Observation Stay
Admit: 2021-10-11 | Discharge: 2021-10-11 | Disposition: A | Discharge status: Home / Self Care | Payer: Medicare HMO | Attending: Internal Medicine | Admitting: Internal Medicine

## 2021-10-11 ENCOUNTER — Other Ambulatory Visit: Payer: Self-pay

## 2021-10-11 DIAGNOSIS — R1084 Generalized abdominal pain: Secondary | ICD-10-CM | POA: Diagnosis not present

## 2021-10-11 DIAGNOSIS — N179 Acute kidney failure, unspecified: Secondary | ICD-10-CM | POA: Diagnosis present

## 2021-10-11 DIAGNOSIS — F1410 Cocaine abuse, uncomplicated: Secondary | ICD-10-CM | POA: Diagnosis present

## 2021-10-11 DIAGNOSIS — R0602 Shortness of breath: Secondary | ICD-10-CM

## 2021-10-11 DIAGNOSIS — R079 Chest pain, unspecified: Secondary | ICD-10-CM | POA: Diagnosis not present

## 2021-10-11 DIAGNOSIS — R109 Unspecified abdominal pain: Secondary | ICD-10-CM | POA: Diagnosis present

## 2021-10-11 LAB — TSH: TSH: 0.461 u[IU]/mL (ref 0.350–4.500)

## 2021-10-11 LAB — ECHOCARDIOGRAM COMPLETE
AR max vel: 3.1 cm2
AV Area VTI: 3.06 cm2
AV Area mean vel: 3.1 cm2
AV Mean grad: 2 mmHg
AV Peak grad: 3.1 mmHg
Ao pk vel: 0.88 m/s
Height: 73 in
S' Lateral: 3.16 cm
Weight: 2880 oz

## 2021-10-11 LAB — TROPONIN I (HIGH SENSITIVITY)
Troponin I (High Sensitivity): 22 ng/L — ABNORMAL HIGH (ref <18)
Troponin I (High Sensitivity): 19 ng/L — ABNORMAL HIGH (ref <18)

## 2021-10-11 LAB — URINALYSIS, COMPLETE (UACMP) WITH MICROSCOPIC
Bacteria, UA: NONE SEEN
Bilirubin Urine: NEGATIVE
Glucose, UA: 150 mg/dL — AB
Hgb urine dipstick: NEGATIVE
Ketones, ur: NEGATIVE mg/dL
Nitrite: NEGATIVE
Protein, ur: 30 mg/dL — AB
Specific Gravity, Urine: 1.02 (ref 1.005–1.030)
Squamous Epithelial / HPF: NONE SEEN (ref 0–5)
pH: 5 (ref 5.0–8.0)

## 2021-10-11 LAB — CBC
HCT: 46 % (ref 39.0–52.0)
Hemoglobin: 14.8 g/dL (ref 13.0–17.0)
MCH: 27.4 pg (ref 26.0–34.0)
MCHC: 32.2 g/dL (ref 30.0–36.0)
MCV: 85.2 fL (ref 80.0–100.0)
Platelets: 143 10*3/uL — ABNORMAL LOW (ref 150–400)
RBC: 5.4 MIL/uL (ref 4.22–5.81)
RDW: 13.8 % (ref 11.5–15.5)
WBC: 9.6 10*3/uL (ref 4.0–10.5)
nRBC: 0 % (ref 0.0–0.2)

## 2021-10-11 LAB — HIV ANTIBODY (ROUTINE TESTING W REFLEX): HIV Screen 4th Generation wRfx: NONREACTIVE

## 2021-10-11 LAB — D-DIMER, QUANTITATIVE: D-Dimer, Quant: 0.66 ug/mL-FEU — ABNORMAL HIGH (ref 0.00–0.50)

## 2021-10-11 LAB — LACTATE DEHYDROGENASE: LDH: 126 U/L (ref 98–192)

## 2021-10-11 LAB — COMPREHENSIVE METABOLIC PANEL
ALT: 15 U/L (ref 0–44)
AST: 14 U/L — ABNORMAL LOW (ref 15–41)
Albumin: 3.4 g/dL — ABNORMAL LOW (ref 3.5–5.0)
Alkaline Phosphatase: 57 U/L (ref 38–126)
Anion gap: 7 (ref 5–15)
BUN: 63 mg/dL — ABNORMAL HIGH (ref 8–23)
CO2: 27 mmol/L (ref 22–32)
Calcium: 8.1 mg/dL — ABNORMAL LOW (ref 8.9–10.3)
Chloride: 100 mmol/L (ref 98–111)
Creatinine, Ser: 1.67 mg/dL — ABNORMAL HIGH (ref 0.61–1.24)
GFR, Estimated: 44 mL/min — ABNORMAL LOW (ref >60)
Glucose, Bld: 133 mg/dL — ABNORMAL HIGH (ref 70–99)
Potassium: 3.5 mmol/L (ref 3.5–5.1)
Sodium: 134 mmol/L — ABNORMAL LOW (ref 135–145)
Total Bilirubin: 3.1 mg/dL — ABNORMAL HIGH (ref 0.3–1.2)
Total Protein: 6.4 g/dL — ABNORMAL LOW (ref 6.5–8.1)

## 2021-10-11 LAB — CK: Total CK: 29 U/L — ABNORMAL LOW (ref 49–397)

## 2021-10-11 LAB — BRAIN NATRIURETIC PEPTIDE: B Natriuretic Peptide: 20.3 pg/mL (ref 0.0–100.0)

## 2021-10-11 LAB — ETHANOL: Alcohol, Ethyl (B): <10 mg/dL (ref <10)

## 2021-10-11 MED ORDER — CLOPIDOGREL BISULFATE 75 MG PO TABS
75.0000 mg | ORAL_TABLET | Freq: Every day | ORAL | Status: DC
  Administered 2021-10-11: 75 mg via ORAL
  Filled 2021-10-11: qty 1

## 2021-10-11 MED ORDER — ALBUTEROL SULFATE (2.5 MG/3ML) 0.083% IN NEBU: 2.5000 mg | INHALATION_SOLUTION | RESPIRATORY_TRACT | Status: DC | PRN

## 2021-10-11 MED ORDER — CARVEDILOL 6.25 MG PO TABS
3.1250 mg | ORAL_TABLET | Freq: Two times a day (BID) | ORAL | Status: DC
  Administered 2021-10-11: 3.125 mg via ORAL
  Filled 2021-10-11: qty 1

## 2021-10-11 MED ORDER — ACETAMINOPHEN 325 MG RE SUPP: 650.0000 mg | Freq: Four times a day (QID) | RECTAL | Status: DC | PRN

## 2021-10-11 MED ORDER — ISOSORBIDE MONONITRATE ER 30 MG PO TB24: 30.0000 mg | ORAL_TABLET | Freq: Every day | ORAL | 1 refills | Status: AC

## 2021-10-11 MED ORDER — CLOPIDOGREL BISULFATE 75 MG PO TABS: 75.0000 mg | ORAL_TABLET | Freq: Every day | ORAL | 1 refills | Status: AC

## 2021-10-11 MED ORDER — ACETAMINOPHEN 325 MG PO TABS: 650.0000 mg | ORAL_TABLET | Freq: Four times a day (QID) | ORAL | Status: DC | PRN

## 2021-10-11 MED ORDER — HEPARIN SODIUM (PORCINE) 5000 UNIT/ML IJ SOLN
5000.0000 [IU] | Freq: Two times a day (BID) | INTRAMUSCULAR | Status: DC
  Administered 2021-10-11 (×2): 5000 [IU] via SUBCUTANEOUS
  Filled 2021-10-11 (×2): qty 1

## 2021-10-11 MED ORDER — SODIUM CHLORIDE 0.9 % IV SOLN: INTRAVENOUS | Status: DC

## 2021-10-11 MED ORDER — MORPHINE SULFATE (PF) 2 MG/ML IV SOLN
2.0000 mg | INTRAVENOUS | Status: DC | PRN
  Administered 2021-10-11 (×3): 2 mg via INTRAVENOUS
  Filled 2021-10-11 (×3): qty 1

## 2021-10-11 MED ORDER — SODIUM CHLORIDE 0.9% FLUSH
3.0000 mL | Freq: Two times a day (BID) | INTRAVENOUS | Status: DC
  Administered 2021-10-11 (×2): 3 mL via INTRAVENOUS

## 2021-10-11 MED ORDER — ASPIRIN 81 MG PO TBEC
81.0000 mg | DELAYED_RELEASE_TABLET | Freq: Every day | ORAL | Status: DC
  Administered 2021-10-11: 81 mg via ORAL
  Filled 2021-10-11: qty 1

## 2021-10-11 MED ORDER — CLONIDINE HCL 0.1 MG PO TABS
0.1000 mg | ORAL_TABLET | Freq: Three times a day (TID) | ORAL | Status: DC
  Administered 2021-10-11 (×2): 0.1 mg via ORAL
  Filled 2021-10-11 (×2): qty 1

## 2021-10-11 MED ORDER — ALPRAZOLAM 0.5 MG PO TABS
0.2500 mg | ORAL_TABLET | Freq: Two times a day (BID) | ORAL | Status: DC | PRN
  Administered 2021-10-11: 0.25 mg via ORAL
  Filled 2021-10-11: qty 1

## 2021-10-11 MED ORDER — HYDRALAZINE HCL 20 MG/ML IJ SOLN: 10.0000 mg | Freq: Four times a day (QID) | INTRAMUSCULAR | Status: DC | PRN

## 2021-10-11 MED ORDER — PANTOPRAZOLE SODIUM 40 MG PO TBEC: 40.0000 mg | DELAYED_RELEASE_TABLET | Freq: Two times a day (BID) | ORAL | 1 refills | Status: AC

## 2021-10-11 MED ORDER — PANTOPRAZOLE SODIUM 40 MG PO TBEC
40.0000 mg | DELAYED_RELEASE_TABLET | Freq: Two times a day (BID) | ORAL | Status: DC
  Administered 2021-10-11: 40 mg via ORAL
  Filled 2021-10-11: qty 1

## 2021-10-11 MED ORDER — ATORVASTATIN CALCIUM 20 MG PO TABS
40.0000 mg | ORAL_TABLET | Freq: Every day | ORAL | Status: DC
  Administered 2021-10-11: 40 mg via ORAL
  Filled 2021-10-11: qty 2

## 2021-10-11 MED ORDER — AMLODIPINE BESYLATE 5 MG PO TABS
5.0000 mg | ORAL_TABLET | Freq: Every day | ORAL | Status: DC
  Administered 2021-10-11: 5 mg via ORAL
  Filled 2021-10-11: qty 1

## 2021-10-11 MED ORDER — SODIUM CHLORIDE 0.9 % IV SOLN
INTRAVENOUS | Status: DC
  Administered 2021-10-11: 75 mL/h via INTRAVENOUS

## 2021-10-11 MED ORDER — ISOSORBIDE MONONITRATE ER 60 MG PO TB24
30.0000 mg | ORAL_TABLET | Freq: Every day | ORAL | Status: DC
  Administered 2021-10-11: 30 mg via ORAL
  Filled 2021-10-11: qty 1

## 2021-10-11 NOTE — Discharge Summary (Signed)
Physician Discharge Summary  Linzie Boursiquot MGQ:[id number] DOB: [birth year] DOA: 10/10/2021  PCP: Tracie Harrier, MD  Admit date: 10/10/2021  Discharge date: 10/11/2021  Admitted From: Home.  Disposition:  Home.  Recommendations for Outpatient Follow-up:  Follow up with PCP in 1-2 weeks Please obtain BMP/CBC in one week Advised to follow up Dr. Nehemiah Massed in one week. Advised to take aspirin Plavix and started on Imdur 30 mg daily.  Home Health: None Equipment/Devices:None  Discharge Condition: Stable CODE STATUS:Full code Diet recommendation: Heart Healthy   Brief [facility] Course: This 71 years old male with PMH significant for polysubstance abuse, cocaine use, THC use presented in the ED with complaints of chest pain, shortness of breath and abdominal pain.  Patient describes chest pain mainly substernal happening for last few days.  He described chest pain gets worse with some positions. Patient was admitted for atypical chest pain.  Cardiology was consulted.  Troponin 22 trended down to 19.  EKG without ischemic changes.  Chest pain seems mainly atypical, which got better with pantoprazole.  Patient was started on Imdur 30 mg daily in addition to home medications.  Renal functions has improved with IV hydration.  Echocardiogram shows LVEF 45 to 48% which is unchanged from prior echo.  Patient feels better and wants to be discharged.  Patient is cleared from cardiology to be discharged.  Patient will follow-up with Dr. Nehemiah Massed in 1 week.   Discharge Diagnoses:  Principal Problem:   Chest pain Active Problems:   SOB (shortness of breath)   Abdominal pain   Cocaine abuse (HCC)   Opiate dependence (HCC)   Benign essential HTN   CAD (coronary artery disease)   AKI (acute kidney injury) ([employer])   Thoracic aortic aneurysm without rupture [facility])    Discharge Instructions  Discharge Instructions     Call MD for:  difficulty breathing, headache or visual disturbances    Complete by: As directed    Call MD for:  persistant dizziness or light-headedness   Complete by: As directed    Call MD for:  persistant nausea and vomiting   Complete by: As directed    Diet - low sodium heart healthy   Complete by: As directed    Diet Carb Modified   Complete by: As directed    Discharge instructions   Complete by: As directed    Advised to follow up PCP in one week Advised to follow up Dr. Nehemiah Massed in one week. Advised to take aspirin Plavix and started on Imdur 30 mg daily.   Increase activity slowly   Complete by: As directed       Allergies as of 10/11/2021       Reactions   Zyvox [linezolid] Other (See Comments)   Nerve damage (neuropathy) Retinal hemmorhage        Medication List     STOP taking these medications    traMADol 50 MG tablet Commonly known as: ULTRAM       TAKE these medications    albuterol 108 (90 Base) MCG/ACT inhaler Commonly known as: VENTOLIN HFA Inhale 2 puffs into the lungs every 6 (six) hours as needed for wheezing or shortness of breath.   ALPRAZolam 0.25 MG tablet Commonly known as: XANAX Take 0.25 mg by mouth 2 (two) times daily as needed for anxiety or sleep.   amLODipine 5 MG tablet Commonly known as: NORVASC Take 5 mg by mouth daily.   aspirin EC 81 MG tablet Take 1 tablet (81 mg total)  by mouth daily.   atorvastatin 40 MG tablet Commonly known as: LIPITOR Take 40 mg by mouth daily.   carvedilol 3.125 MG tablet Commonly known as: COREG Take 1 tablet (3.125 mg total) by mouth 2 (two) times daily with a meal. Hold for heart rate less than 60.   cloNIDine 0.1 MG tablet Commonly known as: CATAPRES Take 0.1 mg by mouth 3 (three) times daily.   clopidogrel 75 MG tablet Commonly known as: PLAVIX Take 1 tablet (75 mg total) by mouth daily. Start taking on: October 12, 2021   isosorbide mononitrate 30 MG 24 hr tablet Commonly known as: IMDUR Take 1 tablet (30 mg total) by mouth daily. Start taking  on: October 12, 2021   pantoprazole 40 MG tablet Commonly known as: PROTONIX Take 1 tablet (40 mg total) by mouth 2 (two) times daily.        Follow-up Information     Tracie Harrier, MD Follow up in 1 week(s).   Specialty: Internal Medicine Contact information: [address] [place] [address] [phone]         Corey Skains, MD Follow up in 1 week(s).   Specialty: Cardiology Contact information: [address] [place] [address] [phone]                Allergies  Allergen Reactions   Zyvox [Linezolid] Other (See Comments)    Nerve damage (neuropathy) Retinal hemmorhage    Consultations: Cardiology   Procedures/Studies: ECHOCARDIOGRAM COMPLETE  Result Date: 10/11/2021    ECHOCARDIOGRAM REPORT   Patient Name:   JAVAUGHN OPDAHL Date of Exam: 10/11/2021 Medical Rec #:  [id number]     Height:       73.0 in Accession #:    [id number]    Weight:       180.0 lb Date of Birth:  08/21/1950      BSA:          2.057 m Patient Age:    71 years      BP:           110/69 mmHg Patient Gender: M             HR:           60 bpm. Exam Location:  [facility] Procedure: 2D Echo, Cardiac Doppler and Color Doppler Indications:     Shortness of breath  History:         Patient has prior history of Echocardiogram examinations. CHF,                  CAD and Previous Myocardial Infarction, Cardiac stent, COPD;                  Risk Factors:Former Smoker, Hypertension and Dyslipidemia.  Sonographer:     Rosalia Hammers Referring Phys:  [place] Diagnosing Phys: Neoma Laming  Sonographer Comments: Suboptimal apical window. Image acquisition challenging due to respiratory motion. IMPRESSIONS  1. Left ventricular ejection fraction, by estimation, is 45 to 50%. The left ventricle has mildly decreased function. The left ventricle demonstrates global hypokinesis. The left ventricular internal cavity size was mildly  dilated. Left ventricular diastolic parameters are indeterminate.  2. Right ventricular systolic function is normal. The right ventricular size is normal.  3. Left atrial size was mildly dilated.  4. Right atrial size was mildly dilated.  5. The mitral valve is normal in structure. Trivial mitral valve regurgitation. No evidence  of mitral stenosis.  6. The aortic valve is normal in structure. Aortic valve regurgitation is not visualized. No aortic stenosis is present.  7. The inferior vena cava is normal in size with greater than 50% respiratory variability, suggesting right atrial pressure of 3 mmHg. FINDINGS  Left Ventricle: Left ventricular ejection fraction, by estimation, is 45 to 50%. The left ventricle has mildly decreased function. The left ventricle demonstrates global hypokinesis. The left ventricular internal cavity size was mildly dilated. There is  no left ventricular hypertrophy. Left ventricular diastolic parameters are indeterminate. Right Ventricle: The right ventricular size is normal. No increase in right ventricular wall thickness. Right ventricular systolic function is normal. Left Atrium: Left atrial size was mildly dilated. Right Atrium: Right atrial size was mildly dilated. Pericardium: There is no evidence of pericardial effusion. Mitral Valve: The mitral valve is normal in structure. Trivial mitral valve regurgitation. No evidence of mitral valve stenosis. Tricuspid Valve: The tricuspid valve is normal in structure. Tricuspid valve regurgitation is mild . No evidence of tricuspid stenosis. Aortic Valve: The aortic valve is normal in structure. Aortic valve regurgitation is not visualized. No aortic stenosis is present. Aortic valve mean gradient measures 2.0 mmHg. Aortic valve peak gradient measures 3.1 mmHg. Aortic valve area, by VTI measures 3.06 cm. Pulmonic Valve: The pulmonic valve was normal in structure. Pulmonic valve regurgitation is not visualized. No evidence of pulmonic  stenosis. Aorta: The aortic root is normal in size and structure. Venous: The inferior vena cava is normal in size with greater than 50% respiratory variability, suggesting right atrial pressure of 3 mmHg. IAS/Shunts: No atrial level shunt detected by color flow Doppler.  LEFT VENTRICLE PLAX 2D LVIDd:         4.17 cm   Diastology LVIDs:         3.16 cm   LV e' medial:  4.24 cm/s LV PW:         1.59 cm   LV e' lateral: 5.87 cm/s LV IVS:        1.34 cm LVOT diam:     2.10 cm LV SV:         55 LV SV Index:   27 LVOT Area:     3.46 cm  RIGHT VENTRICLE RV S prime:     14.40 cm/s LEFT ATRIUM         Index LA diam:    4.00 cm 1.94 cm/m  AORTIC VALVE                    PULMONIC VALVE AV Area (Vmax):    3.10 cm     PV Vmax:       0.84 m/s AV Area (Vmean):   3.10 cm     PV Vmean:      62.900 cm/s AV Area (VTI):     3.06 cm     PV VTI:        0.183 m AV Vmax:           87.50 cm/s   PV Peak grad:  2.8 mmHg AV Vmean:          66.900 cm/s  PV Mean grad:  2.0 mmHg AV VTI:            0.179 m AV Peak Grad:      3.1 mmHg AV Mean Grad:      2.0 mmHg LVOT Vmax:         78.30 cm/s LVOT Vmean:  59.900 cm/s LVOT VTI:          0.158 m LVOT/AV VTI ratio: 0.88  AORTA Ao Root diam: 4.00 cm  SHUNTS Systemic VTI:  0.16 m Systemic Diam: 2.10 cm Neoma Laming Electronically signed by Neoma Laming Signature Date/Time: 10/11/2021/10:22:49 AM    Final    CT Renal Stone Study  Result Date: 10/10/2021 CLINICAL DATA:  Respiratory illness. Also flank pain. Concern for kidney stone. EXAM: CT CHEST, ABDOMEN AND PELVIS WITHOUT CONTRAST TECHNIQUE: Multidetector CT imaging of the chest, abdomen and pelvis was performed following the standard protocol without IV contrast. RADIATION DOSE REDUCTION: This exam was performed according to the departmental dose-optimization program which includes automated exposure control, adjustment of the mA and/or kV according to patient size and/or use of iterative reconstruction technique. COMPARISON:  Chest  radiograph dated 10/10/2021 and CT dated 09/17/2019 and 08/08/2020. FINDINGS: Evaluation of this exam is limited in the absence of intravenous contrast. CT CHEST FINDINGS Cardiovascular: There is no cardiomegaly or pericardial effusion. Advanced 3 vessel coronary vascular calcification. Mild atherosclerotic calcification of the thoracic aorta. No aneurysmal dilatation. The central pulmonary arteries are grossly unremarkable. Mediastinum/Nodes: No hilar or mediastinal adenopathy. The esophagus is grossly unremarkable. No mediastinal fluid collection. Lungs/Pleura: Minimal scarring in the lingula. A 4 mm left lower lobe subpleural nodule (139/3) similar to study of 2021. A 4 mm nodule along the right fissure (87/3) similar to 2021. A 3 mm right middle lobe nodule (131/3) similar to 2021. No focal consolidation, pleural effusion, pneumothorax. The central airways are patent. Musculoskeletal: Degenerative changes of the spine. No acute osseous pathology. CT ABDOMEN PELVIS FINDINGS No intra-abdominal free air or free fluid. Hepatobiliary: No focal liver abnormality is seen. No gallstones, gallbladder wall thickening, or biliary dilatation. Pancreas: Unremarkable. No pancreatic ductal dilatation or surrounding inflammatory changes. Spleen: Normal in size without focal abnormality. Adrenals/Urinary Tract: The adrenal glands unremarkable. Nonobstructing bilateral renal calculi measure up to 7 mm in the inferior pole of the left kidney. There is no hydronephrosis on either side. The visualized ureters and urinary bladder appear unremarkable. Stomach/Bowel: There is a 3 cm duodenal diverticulum. There is sigmoid diverticulosis with muscular hypertrophy. No active inflammatory changes. There is diffuse scattered colonic diverticula. There is no bowel obstruction or active inflammation. The appendix is normal. Vascular/Lymphatic: Moderate aortoiliac atherosclerotic disease. The IVC is unremarkable. No portal venous gas. There  is no adenopathy. Reproductive: The prostate and seminal vesicles are grossly unremarkable. No pelvic mass. Other: None Musculoskeletal: Multilevel degenerative changes with disc desiccation and vacuum phenomena. No acute osseous pathology. IMPRESSION: 1. No acute intrathoracic, abdominal, or pelvic pathology. 2. Nonobstructing bilateral renal calculi. No hydronephrosis. 3. Colonic diverticulosis. No bowel obstruction. Normal appendix. 4. Small bilateral pulmonary nodules measuring up to 4 mm similar to study of 2021. No follow-up needed if patient is low-risk (and has no known or suspected primary neoplasm). Non-contrast chest CT can be considered in 12 months if patient is high-risk. This recommendation follows the consensus statement: Guidelines for Management of Incidental Pulmonary Nodules Detected on CT Images: From the Fleischner Society 2017; Radiology 2017; 284:228-243. 5. Aortic Atherosclerosis (ICD10-I70.0). Electronically Signed   By: Anner Crete M.D.   On: 10/10/2021 23:42   CT CHEST WO CONTRAST  Result Date: 10/10/2021 CLINICAL DATA:  Respiratory illness. Also flank pain. Concern for kidney stone. EXAM: CT CHEST, ABDOMEN AND PELVIS WITHOUT CONTRAST TECHNIQUE: Multidetector CT imaging of the chest, abdomen and pelvis was performed following the standard protocol without IV contrast. RADIATION DOSE  REDUCTION: This exam was performed according to the departmental dose-optimization program which includes automated exposure control, adjustment of the mA and/or kV according to patient size and/or use of iterative reconstruction technique. COMPARISON:  Chest radiograph dated 10/10/2021 and CT dated 09/17/2019 and 08/08/2020. FINDINGS: Evaluation of this exam is limited in the absence of intravenous contrast. CT CHEST FINDINGS Cardiovascular: There is no cardiomegaly or pericardial effusion. Advanced 3 vessel coronary vascular calcification. Mild atherosclerotic calcification of the thoracic aorta. No  aneurysmal dilatation. The central pulmonary arteries are grossly unremarkable. Mediastinum/Nodes: No hilar or mediastinal adenopathy. The esophagus is grossly unremarkable. No mediastinal fluid collection. Lungs/Pleura: Minimal scarring in the lingula. A 4 mm left lower lobe subpleural nodule (139/3) similar to study of 2021. A 4 mm nodule along the right fissure (87/3) similar to 2021. A 3 mm right middle lobe nodule (131/3) similar to 2021. No focal consolidation, pleural effusion, pneumothorax. The central airways are patent. Musculoskeletal: Degenerative changes of the spine. No acute osseous pathology. CT ABDOMEN PELVIS FINDINGS No intra-abdominal free air or free fluid. Hepatobiliary: No focal liver abnormality is seen. No gallstones, gallbladder wall thickening, or biliary dilatation. Pancreas: Unremarkable. No pancreatic ductal dilatation or surrounding inflammatory changes. Spleen: Normal in size without focal abnormality. Adrenals/Urinary Tract: The adrenal glands unremarkable. Nonobstructing bilateral renal calculi measure up to 7 mm in the inferior pole of the left kidney. There is no hydronephrosis on either side. The visualized ureters and urinary bladder appear unremarkable. Stomach/Bowel: There is a 3 cm duodenal diverticulum. There is sigmoid diverticulosis with muscular hypertrophy. No active inflammatory changes. There is diffuse scattered colonic diverticula. There is no bowel obstruction or active inflammation. The appendix is normal. Vascular/Lymphatic: Moderate aortoiliac atherosclerotic disease. The IVC is unremarkable. No portal venous gas. There is no adenopathy. Reproductive: The prostate and seminal vesicles are grossly unremarkable. No pelvic mass. Other: None Musculoskeletal: Multilevel degenerative changes with disc desiccation and vacuum phenomena. No acute osseous pathology. IMPRESSION: 1. No acute intrathoracic, abdominal, or pelvic pathology. 2. Nonobstructing bilateral renal  calculi. No hydronephrosis. 3. Colonic diverticulosis. No bowel obstruction. Normal appendix. 4. Small bilateral pulmonary nodules measuring up to 4 mm similar to study of 2021. No follow-up needed if patient is low-risk (and has no known or suspected primary neoplasm). Non-contrast chest CT can be considered in 12 months if patient is high-risk. This recommendation follows the consensus statement: Guidelines for Management of Incidental Pulmonary Nodules Detected on CT Images: From the Fleischner Society 2017; Radiology 2017; 284:228-243. 5. Aortic Atherosclerosis (ICD10-I70.0). Electronically Signed   By: Anner Crete M.D.   On: 10/10/2021 23:42   DG Chest Port 1 View  Result Date: 10/10/2021 CLINICAL DATA:  Questionable sepsis. EXAM: PORTABLE CHEST 1 VIEW COMPARISON:  Chest radiograph dated 08/08/2020. FINDINGS: No focal consolidation, pleural effusion, or pneumothorax. Stable cardiac silhouette. No acute osseous pathology. IMPRESSION: No active disease. Electronically Signed   By: Anner Crete M.D.   On: 10/10/2021 22:09    Echocardiogram   Subjective: Patient was seen and examined at bedside.  Overnight events noted.  Patient reports feeling much better.  Patient is being discharged home.  Discharge Exam: Vitals:   10/11/21 1400 10/11/21 1500  BP: 107/76 107/71  Pulse: 70 71  Resp:  16  Temp:    SpO2: 94% 94%   Vitals:   10/11/21 1330 10/11/21 1332 10/11/21 1400 10/11/21 1500  BP: 136/77  107/76 107/71  Pulse: 64  70 71  Resp:  14  16  Temp:  TempSrc:      SpO2: 92% 94% 94% 94%  Weight:      Height:        General: Pt is alert, awake, not in acute distress Cardiovascular: RRR, S1/S2 +, no rubs, no gallops Respiratory: CTA bilaterally, no wheezing, no rhonchi Abdominal: Soft, NT, ND, bowel sounds + Extremities: no edema, no cyanosis    The results of significant diagnostics from this hospitalization (including imaging, microbiology, ancillary and laboratory)  are listed below for reference.     Microbiology: Recent Results (from the past 240 hour(s))  Blood Culture (routine x 2)     Status: None (Preliminary result)   Collection Time: 10/10/21  9:45 PM   Specimen: BLOOD  Result Value Ref Range Status   Specimen Description BLOOD BLOOD RIGHT WRIST  Final   Special Requests   Final    BOTTLES DRAWN AEROBIC AND ANAEROBIC Blood Culture adequate volume   Culture   Final    NO GROWTH < 12 HOURS Performed at [facility], [address] [place], [place] [address]    Report Status PENDING  Incomplete  Blood Culture (routine x 2)     Status: None (Preliminary result)   Collection Time: 10/10/21  9:45 PM   Specimen: BLOOD  Result Value Ref Range Status   Specimen Description BLOOD LEFT ANTECUBITAL  Final   Special Requests   Final    BOTTLES DRAWN AEROBIC AND ANAEROBIC Blood Culture adequate volume   Culture   Final    NO GROWTH < 12 HOURS Performed at [facility], [address], [place], [place] [address]    Report Status PENDING  Incomplete  Resp Panel by RT-PCR (Flu A&B, Covid) Anterior Nasal Swab     Status: None   Collection Time: 10/10/21 10:47 PM   Specimen: Anterior Nasal Swab  Result Value Ref Range Status   SARS Coronavirus 2 by RT PCR NEGATIVE NEGATIVE Final    Comment: (NOTE) SARS-CoV-2 target nucleic acids are NOT DETECTED.  The SARS-CoV-2 RNA is generally detectable in upper respiratory specimens during the acute phase of infection. The lowest concentration of SARS-CoV-2 viral copies this assay can detect is 138 copies/mL. A negative result does not preclude SARS-Cov-2 infection and should not be used as the sole basis for treatment or other patient management decisions. A negative result may occur with  improper specimen collection/handling, submission of specimen other than nasopharyngeal swab, presence of viral mutation(s) within the areas targeted by this assay, and inadequate number of  viral copies(<138 copies/mL). A negative result must be combined with clinical observations, patient history, and epidemiological information. The expected result is Negative.  Fact Sheet for Patients:  EntrepreneurPulse.com.au  Fact Sheet for Healthcare Providers:  IncredibleEmployment.be  This test is no t yet approved or cleared by the [place] FDA and  has been authorized for detection and/or diagnosis of SARS-CoV-2 by FDA under an Emergency Use Authorization (EUA). This EUA will remain  in effect (meaning this test can be used) for the duration of the COVID-19 declaration under Section 564(b)(1) of the Act, 21 U.S.C.section 360bbb-3(b)(1), unless the authorization is terminated  or revoked sooner.       Influenza A by PCR NEGATIVE NEGATIVE Final   Influenza B by PCR NEGATIVE NEGATIVE Final    Comment: (NOTE) The Xpert Xpress SARS-CoV-2/FLU/RSV plus assay is intended as an aid in the diagnosis of influenza from Nasopharyngeal swab specimens and should not be used as a sole basis for treatment.  Nasal washings and aspirates are unacceptable for Xpert Xpress SARS-CoV-2/FLU/RSV testing.  Fact Sheet for Patients: EntrepreneurPulse.com.au  Fact Sheet for Healthcare Providers: IncredibleEmployment.be  This test is not yet approved or cleared by the [place] FDA and has been authorized for detection and/or diagnosis of SARS-CoV-2 by FDA under an Emergency Use Authorization (EUA). This EUA will remain in effect (meaning this test can be used) for the duration of the COVID-19 declaration under Section 564(b)(1) of the Act, 21 U.S.C. section 360bbb-3(b)(1), unless the authorization is terminated or revoked.  Performed at [facility], [address] [place], [place] [address]      Labs: BNP (last 3 results) Recent Labs    10/10/21 2145  BNP 00.8   Basic Metabolic Panel: Recent  Labs  Lab 10/10/21 2145 10/11/21 0549  NA 132* 134*  K 3.7 3.5  CL 93* 100  CO2 26 27  GLUCOSE 113* 133*  BUN 74* 63*  CREATININE 2.26* 1.67*  CALCIUM 8.4* 8.1*   Liver Function Tests: Recent Labs  Lab 10/10/21 2145 10/11/21 0549  AST 18 14*  ALT 18 15  ALKPHOS 64 57  BILITOT 3.4* 3.1*  PROT 7.4 6.4*  ALBUMIN 3.9 3.4*   No results for input(s): "LIPASE", "AMYLASE" in the last 168 hours. No results for input(s): "AMMONIA" in the last 168 hours. CBC: Recent Labs  Lab 10/10/21 2145 10/11/21 0549  WBC 13.4* 9.6  NEUTROABS 10.6*  --   HGB 16.8 14.8  HCT 51.2 46.0  MCV 84.9 85.2  PLT 187 143*   Cardiac Enzymes: Recent Labs  Lab 10/10/21 2145  CKTOTAL 29*   BNP: Invalid input(s): "POCBNP" CBG: No results for input(s): "GLUCAP" in the last 168 hours. D-Dimer Recent Labs    10/10/21 2145  DDIMER 0.66*   Hgb A1c No results for input(s): "HGBA1C" in the last 72 hours. Lipid Profile No results for input(s): "CHOL", "HDL", "LDLCALC", "TRIG", "CHOLHDL", "LDLDIRECT" in the last 72 hours. Thyroid function studies Recent Labs    10/10/21 2145  TSH 0.461   Anemia work up No results for input(s): "VITAMINB12", "FOLATE", "FERRITIN", "TIBC", "IRON", "RETICCTPCT" in the last 72 hours. Urinalysis    Component Value Date/Time   COLORURINE YELLOW (A) 10/11/2021 0025   APPEARANCEUR HAZY (A) 10/11/2021 0025   APPEARANCEUR Clear 10/04/2017 0911   LABSPEC 1.020 10/11/2021 0025   PHURINE 5.0 10/11/2021 0025   GLUCOSEU 150 (A) 10/11/2021 0025   HGBUR NEGATIVE 10/11/2021 0025   BILIRUBINUR NEGATIVE 10/11/2021 0025   BILIRUBINUR Negative 10/04/2017 0911   KETONESUR NEGATIVE 10/11/2021 0025   PROTEINUR 30 (A) 10/11/2021 0025   NITRITE NEGATIVE 10/11/2021 0025   LEUKOCYTESUR SMALL (A) 10/11/2021 0025   Sepsis Labs Recent Labs  Lab 10/10/21 2145 10/11/21 0549  WBC 13.4* 9.6   Microbiology Recent Results (from the past 240 hour(s))  Blood Culture (routine x 2)      Status: None (Preliminary result)   Collection Time: 10/10/21  9:45 PM   Specimen: BLOOD  Result Value Ref Range Status   Specimen Description BLOOD BLOOD RIGHT WRIST  Final   Special Requests   Final    BOTTLES DRAWN AEROBIC AND ANAEROBIC Blood Culture adequate volume   Culture   Final    NO GROWTH < 12 HOURS Performed at [facility], [address] [place], [place] [address]    Report Status PENDING  Incomplete  Blood Culture (routine x 2)     Status: None (Preliminary result)   Collection  Time: 10/10/21  9:45 PM   Specimen: BLOOD  Result Value Ref Range Status   Specimen Description BLOOD LEFT ANTECUBITAL  Final   Special Requests   Final    BOTTLES DRAWN AEROBIC AND ANAEROBIC Blood Culture adequate volume   Culture   Final    NO GROWTH < 12 HOURS Performed at [facility], [address], [place], [place] [address]    Report Status PENDING  Incomplete  Resp Panel by RT-PCR (Flu A&B, Covid) Anterior Nasal Swab     Status: None   Collection Time: 10/10/21 10:47 PM   Specimen: Anterior Nasal Swab  Result Value Ref Range Status   SARS Coronavirus 2 by RT PCR NEGATIVE NEGATIVE Final    Comment: (NOTE) SARS-CoV-2 target nucleic acids are NOT DETECTED.  The SARS-CoV-2 RNA is generally detectable in upper respiratory specimens during the acute phase of infection. The lowest concentration of SARS-CoV-2 viral copies this assay can detect is 138 copies/mL. A negative result does not preclude SARS-Cov-2 infection and should not be used as the sole basis for treatment or other patient management decisions. A negative result may occur with  improper specimen collection/handling, submission of specimen other than nasopharyngeal swab, presence of viral mutation(s) within the areas targeted by this assay, and inadequate number of viral copies(<138 copies/mL). A negative result must be combined with clinical observations, patient history, and  epidemiological information. The expected result is Negative.  Fact Sheet for Patients:  EntrepreneurPulse.com.au  Fact Sheet for Healthcare Providers:  IncredibleEmployment.be  This test is no t yet approved or cleared by the [place] FDA and  has been authorized for detection and/or diagnosis of SARS-CoV-2 by FDA under an Emergency Use Authorization (EUA). This EUA will remain  in effect (meaning this test can be used) for the duration of the COVID-19 declaration under Section 564(b)(1) of the Act, 21 U.S.C.section 360bbb-3(b)(1), unless the authorization is terminated  or revoked sooner.       Influenza A by PCR NEGATIVE NEGATIVE Final   Influenza B by PCR NEGATIVE NEGATIVE Final    Comment: (NOTE) The Xpert Xpress SARS-CoV-2/FLU/RSV plus assay is intended as an aid in the diagnosis of influenza from Nasopharyngeal swab specimens and should not be used as a sole basis for treatment. Nasal washings and aspirates are unacceptable for Xpert Xpress SARS-CoV-2/FLU/RSV testing.  Fact Sheet for Patients: EntrepreneurPulse.com.au  Fact Sheet for Healthcare Providers: IncredibleEmployment.be  This test is not yet approved or cleared by the [place] FDA and has been authorized for detection and/or diagnosis of SARS-CoV-2 by FDA under an Emergency Use Authorization (EUA). This EUA will remain in effect (meaning this test can be used) for the duration of the COVID-19 declaration under Section 564(b)(1) of the Act, 21 U.S.C. section 360bbb-3(b)(1), unless the authorization is terminated or revoked.  Performed at [facility], [address], [place],  [address]      Time coordinating discharge: Over 30 minutes  SIGNED:   Shawna Clamp, MD  [facility] 10/11/2021, 4:17 PM Pager   If 7PM-7AM, please contact night-coverage

## 2021-10-11 NOTE — Assessment & Plan Note (Addendum)
Cont asa/ statin / coreg . Cardiology consult for eval of chest pain.

## 2021-10-11 NOTE — ED Notes (Signed)
ECHO at bedside.

## 2021-10-11 NOTE — H&P (Signed)
History and Physical    Patient: Theodore Obrien WCB:[id number] DOB: 06-Jan-1951 DOA: 10/10/2021 DOS: the patient was seen and examined on 10/11/2021 PCP: Tracie Harrier, MD  Patient coming from: Home  Chief Complaint:  Chief Complaint  Patient presents with   Chest Pain   Shortness of Breath   Abdominal Pain    Coming from home c/o chest pain X2 days, SOB X3-4 days, abdominal pain X2 days, N/V/D, pt snorted some heroin this morning   HPI: Theodore Obrien is a 71 y.o. male with medical history significant of  polysubstance abuse coming with c/o chest pain, shortness of breath, abdominal pain. Patient is somewhat of a poor historian and states that its only been going on for the past few days. Reports that he has a history of heart disease and chest pain better than the abdominal pain.  States that shortness of breath has difficulty with breathing with activity and some positions.   Review of Systems: As mentioned in the history of present illness. All other systems reviewed and are negative. Past Medical History:  Diagnosis Date   CHF (congestive heart failure) (HCC)    COPD (chronic obstructive pulmonary disease) (HCC)    Dyspnea    GERD (gastroesophageal reflux disease)    History of kidney stones    Hypertension    Myocardial infarction ([place]) 2020   Past Surgical History:  Procedure Laterality Date   CARPAL TUNNEL RELEASE Left    CORONARY STENT INTERVENTION N/A 12/31/2018   Procedure: CORONARY STENT INTERVENTION;  Surgeon: Yolonda Kida, MD;  Location: [place] [facility];  Service: Cardiovascular;  Laterality: N/A;   FEMORAL HERNIA REPAIR Left 10/27/2014   Procedure: Repair of left femoral and direct hernia with mesh ;  Surgeon: Sherri Rad, MD;  Location: [facility];  Service: General;  Laterality: Left;   FRACTURE SURGERY     right leg   LEFT HEART CATH AND CORONARY ANGIOGRAPHY N/A 12/31/2018   Procedure: LEFT HEART CATH AND CORONARY ANGIOGRAPHY;  Surgeon: Corey Skains,  MD;  Location: [place] [facility];  Service: Cardiovascular;  Laterality: N/A;   XI ROBOTIC ASSISTED INGUINAL HERNIA REPAIR WITH MESH Bilateral 08/28/2019   Procedure: XI ROBOTIC ASSISTED INGUINAL HERNIA REPAIR WITH MESH;  Surgeon: Herbert Pun, MD;  Location: [facility];  Service: General;  Laterality: Bilateral;   Social History:  reports that he quit smoking about 5 years ago. His smoking use included cigarettes. He has a 6.00 pack-year smoking history. He has never used smokeless tobacco. He reports current alcohol use. He reports current drug use. Drug: IV.  Allergies  Allergen Reactions   Zyvox [Linezolid] Other (See Comments)    Nerve damage (neuropathy) Retinal hemmorhage    Family History  Problem Relation Age of Onset   Hypertension Mother    Heart disease Mother    Prostate cancer Father    Leukemia Father     Prior to Admission medications   Medication Sig Start Date End Date Taking? Authorizing Provider  albuterol (VENTOLIN HFA) 108 (90 Base) MCG/ACT inhaler Inhale 2 puffs into the lungs every 6 (six) hours as needed for wheezing or shortness of breath.     [provider]  ALPRAZolam Duanne Moron) 0.25 MG tablet Take 0.25 mg by mouth 2 (two) times daily as needed for anxiety or sleep.  08/03/17   [provider]  amLODipine (NORVASC) 5 MG tablet Take 5 mg by mouth daily.     [provider]  aspirin EC 81 MG  EC tablet Take 1 tablet (81 mg total) by mouth daily. 01/02/19   Stark Jock Jude, MD  atorvastatin (LIPITOR) 40 MG tablet Take 40 mg by mouth daily.    [provider]  carvedilol (COREG) 3.125 MG tablet Take 1 tablet (3.125 mg total) by mouth 2 (two) times daily with a meal. Hold for heart rate less than 60. 01/01/19   Ojie, Jude, MD  cloNIDine (CATAPRES) 0.1 MG tablet Take 0.1 mg by mouth 3 (three) times daily.  06/19/17   [provider]    Physical Exam: Vitals:   10/10/21 2136 10/10/21 2137 10/10/21 2347  BP: 130/79  (!)  152/97  Pulse: 70  85  Resp: 18  20  Temp: 99.6 F (37.6 C)  98.3 F (36.8 C)  TempSrc: Oral  Oral  SpO2: 98%  94%  Weight:  81.6 kg   Height:  '6\' 1"'$  (1.854 m)   Physical Exam Vitals and nursing note reviewed.  Constitutional:      General: He is not in acute distress.    Appearance: He is not ill-appearing, toxic-appearing or diaphoretic.  HENT:     Head: Normocephalic and atraumatic.     Right Ear: Hearing and external ear normal.     Left Ear: Hearing and external ear normal.     Nose: Nose normal. No nasal deformity or congestion.     Mouth/Throat:     Lips: Pink.     Mouth: Mucous membranes are moist.     Tongue: No lesions.     Pharynx: Oropharynx is clear. No oropharyngeal exudate or posterior oropharyngeal erythema.  Eyes:     Extraocular Movements: Extraocular movements intact.     Pupils: Pupils are equal, round, and reactive to light.  Cardiovascular:     Rate and Rhythm: Normal rate and regular rhythm.     Pulses: Normal pulses.     Heart sounds: Normal heart sounds.  Pulmonary:     Effort: Pulmonary effort is normal.     Breath sounds: Normal breath sounds.  Abdominal:     General: Bowel sounds are normal. There is no distension.     Palpations: Abdomen is soft. There is no mass.     Tenderness: There is no abdominal tenderness. There is no guarding.     Hernia: No hernia is present.  Musculoskeletal:     Right lower leg: No edema.     Left lower leg: No edema.  Skin:    General: Skin is warm.  Neurological:     General: No focal deficit present.     Mental Status: He is alert and oriented to person, place, and time.     GCS: GCS eye subscore is 4. GCS verbal subscore is 5. GCS motor subscore is 6.     Cranial Nerves: Cranial nerves 2-12 are intact.     Motor: Motor function is intact.  Psychiatric:        Attention and Perception: Attention normal.        Mood and Affect: Mood normal.        Speech: Speech normal.        Behavior: Behavior normal.  Behavior is cooperative.        Cognition and Memory: Cognition normal.     Data Reviewed: Results for orders placed or performed during the hospital encounter of 10/10/21 (from the past 24 hour(s))  Comprehensive metabolic panel     Status: Abnormal   Collection Time: 10/10/21  9:45 PM  Result Value Ref Range   Sodium 132 (L) 135 - 145 mmol/L   Potassium 3.7 3.5 - 5.1 mmol/L   Chloride 93 (L) 98 - 111 mmol/L   CO2 26 22 - 32 mmol/L   Glucose, Bld 113 (H) 70 - 99 mg/dL   BUN 74 (H) 8 - 23 mg/dL   Creatinine, Ser 2.26 (H) 0.61 - 1.24 mg/dL   Calcium 8.4 (L) 8.9 - 10.3 mg/dL   Total Protein 7.4 6.5 - 8.1 g/dL   Albumin 3.9 3.5 - 5.0 g/dL   AST 18 15 - 41 U/L   ALT 18 0 - 44 U/L   Alkaline Phosphatase 64 38 - 126 U/L   Total Bilirubin 3.4 (H) 0.3 - 1.2 mg/dL   GFR, Estimated 31 (L) >60 mL/min   Anion gap 13 5 - 15  CBC with Differential     Status: Abnormal   Collection Time: 10/10/21  9:45 PM  Result Value Ref Range   WBC 13.4 (H) 4.0 - 10.5 K/uL   RBC 6.03 (H) 4.22 - 5.81 MIL/uL   Hemoglobin 16.8 13.0 - 17.0 g/dL   HCT 51.2 39.0 - 52.0 %   MCV 84.9 80.0 - 100.0 fL   MCH 27.9 26.0 - 34.0 pg   MCHC 32.8 30.0 - 36.0 g/dL   RDW 14.0 11.5 - 15.5 %   Platelets 187 150 - 400 K/uL   nRBC 0.0 0.0 - 0.2 %   Neutrophils Relative % 79 %   Neutro Abs 10.6 (H) 1.7 - 7.7 K/uL   Lymphocytes Relative 10 %   Lymphs Abs 1.3 0.7 - 4.0 K/uL   Monocytes Relative 10 %   Monocytes Absolute 1.4 (H) 0.1 - 1.0 K/uL   Eosinophils Relative 0 %   Eosinophils Absolute 0.0 0.0 - 0.5 K/uL   Basophils Relative 0 %   Basophils Absolute 0.0 0.0 - 0.1 K/uL   Immature Granulocytes 1 %   Abs Immature Granulocytes 0.07 0.00 - 0.07 K/uL  Troponin I (High Sensitivity)     Status: Abnormal   Collection Time: 10/10/21  9:45 PM  Result Value Ref Range   Troponin I (High Sensitivity) 22 (H) <18 ng/L  D-dimer, quantitative     Status: Abnormal   Collection Time: 10/10/21  9:45 PM  Result Value Ref Range    D-Dimer, Quant 0.66 (H) 0.00 - 0.50 ug/mL-FEU  Lactic acid, plasma     Status: None   Collection Time: 10/10/21  9:48 PM  Result Value Ref Range   Lactic Acid, Venous 1.1 0.5 - 1.9 mmol/L  Resp Panel by RT-PCR (Flu A&B, Covid) Anterior Nasal Swab     Status: None   Collection Time: 10/10/21 10:47 PM   Specimen: Anterior Nasal Swab  Result Value Ref Range   SARS Coronavirus 2 by RT PCR NEGATIVE NEGATIVE   Influenza A by PCR NEGATIVE NEGATIVE   Influenza B by PCR NEGATIVE NEGATIVE  Urinalysis, Complete w Microscopic Urine, Clean Catch     Status: Abnormal   Collection Time: 10/11/21 12:25 AM  Result Value Ref Range   Color, Urine YELLOW (A) YELLOW   APPearance HAZY (A) CLEAR   Specific Gravity, Urine 1.020 1.005 - 1.030   pH 5.0 5.0 - 8.0   Glucose, UA 150 (A) NEGATIVE mg/dL   Hgb urine dipstick NEGATIVE NEGATIVE   Bilirubin Urine NEGATIVE NEGATIVE   Ketones, ur NEGATIVE NEGATIVE mg/dL   Protein, ur 30 (A) NEGATIVE mg/dL   Nitrite NEGATIVE NEGATIVE  Leukocytes,Ua SMALL (A) NEGATIVE   RBC / HPF 0-5 0 - 5 RBC/hpf   WBC, UA 11-20 0 - 5 WBC/hpf   Bacteria, UA NONE SEEN NONE SEEN   Squamous Epithelial / LPF NONE SEEN 0 - 5   Mucus PRESENT    Hyaline Casts, UA PRESENT      Assessment and Plan: * Chest pain Initial troponin of 22,  EKG shows sinus rhythm, right bundle branch block and no change from prior tracing. We will obtain an echocardiogram. We will obtain cardiology consult. Last cardiac cath was in 2020 showing: Prox LAD lesion is 90% stenosed. Prox Cx to Mid Cx lesion is 60% stenosed. Prox RCA lesion is 20% stenosed with 20% stenosed side branch in RV Branch. A drug-eluting stent was successfully placed using a STENT RESOLUTE ONYX 3.5X15. Post intervention, there is a 0% residual stenosis.   Last echocardiogram on 08/2019: IMPRESSIONS     1. Left ventricular ejection fraction, by estimation, is 40 to 45%. The  left ventricle has mildly decreased function. The  left ventricle  demonstrates regional wall motion abnormalities (see scoring  diagram/findings for description). Left ventricular  diastolic parameters were normal.   2. Right ventricular systolic function is normal. The right ventricular  size is normal.   3. The mitral valve is normal in structure. Mild mitral valve  regurgitation.   4. The aortic valve is normal in structure. Aortic valve regurgitation is  not visualized.  Dr. Neoma Laming is covering for [facility] clinic pt was seen by [facility] and we will consutl [place] for chest pain. PRN NTG. PRN morphine. O2 [place] for o2 sats 90 and above.      SOB (shortness of breath) We will obtain 2 d echo and bnp pending.  IV PPI. PRN albuterol.  V/Q scan as deemed appropriate - dimer pending. Supplemental oxygen for O2 sats above 90%.   Abdominal pain Abdominal pain also with chest pain for past several days.  Not sure when last cocaine use was but pt is at high risk for atherosclerosis and blockages in mesenteric vessels with cocaine and h/o smoking.  Lactic is normal. LDH pending. Cannot do contrast study due to creatinine.  Ct abd noncontrast negative and without any hydronephrosis.  Iv ppi.   Cocaine abuse ([place]) uds pending.   Opiate dependence ([place]) Pt uses heroine and recently has not done any iv but snorted it.  psych consult per am team.    Benign essential HTN Blood pressure (!) 152/97, pulse 85, temperature 98.3 F (36.8 C), temperature source Oral, resp. rate 20, height '6\' 1"'$  (1.854 m), weight 81.6 kg, SpO2 94 %. continue amlodipine, clonidine, and   coreg. PRN hydralazine.     CAD (coronary artery disease) Cont asa/ statin / coreg . Cardiology consult for eval of chest pain.   AKI (acute kidney injury) [facility]) Lab Results  Component Value Date   CREATININE 2.26 (H) 10/10/2021   CREATININE 0.92 08/09/2020   CREATININE 0.98 08/08/2020  AKI cont with ivf hydration. No hydronephrosis on ct. Renally  dose meds and abx.    Thoracic aortic aneurysm without rupture [facility]) H/o thoracic aortic aneurysm. We will obtain CTA of chest for chest pain and also abd to identify and aneurysm or embolism or bowel ischemia once creatinine returns to normal.      Advance Care Planning:    Code Status: Full Code   Consults:  Cardiology: Dr. Humphrey Rolls.   Family Communication:  Darcey Nora (Sister)  [phone] (Home  Phone)  Severity of Illness: The appropriate patient status for this patient is OBSERVATION. Observation status is judged to be reasonable and necessary in order to provide the required intensity of service to ensure the patient's safety. The patient's presenting symptoms, physical exam findings, and initial radiographic and laboratory data in the context of their medical condition is felt to place them at decreased risk for further clinical deterioration. Furthermore, it is anticipated that the patient will be medically stable for discharge from the hospital within 2 midnights of admission.   Author: Para Skeans, MD 10/11/2021 1:11 AM  For on call review [email or web address].

## 2021-10-11 NOTE — Assessment & Plan Note (Addendum)
Initial troponin of 22,  EKG shows sinus rhythm, right bundle branch block and no change from prior tracing. We will obtain an echocardiogram. We will obtain cardiology consult. Last cardiac cath was in 2020 showing: . Prox LAD lesion is 90% stenosed. . Prox Cx to Mid Cx lesion is 60% stenosed. . Prox RCA lesion is 20% stenosed with 20% stenosed side branch in RV Branch. . A drug-eluting stent was successfully placed using a STENT RESOLUTE ONYX 3.5X15. Marland Kitchen Post intervention, there is a 0% residual stenosis.   Last echocardiogram on 08/2019: IMPRESSIONS    1. Left ventricular ejection fraction, by estimation, is 40 to 45%. The  left ventricle has mildly decreased function. The left ventricle  demonstrates regional wall motion abnormalities (see scoring  diagram/findings for description). Left ventricular  diastolic parameters were normal.  2. Right ventricular systolic function is normal. The right ventricular  size is normal.  3. The mitral valve is normal in structure. Mild mitral valve  regurgitation.  4. The aortic valve is normal in structure. Aortic valve regurgitation is  not visualized.  Dr. Neoma Laming is covering for [facility] clinic pt was seen by [facility] and we will consutl [place] for chest pain. PRN NTG. PRN morphine. O2 [place] for o2 sats 90 and above.

## 2021-10-11 NOTE — Progress Notes (Signed)
Admissions profile updated 

## 2021-10-11 NOTE — Assessment & Plan Note (Signed)
We will obtain 2 d echo and bnp pending.  IV PPI. PRN albuterol.  V/Q scan as deemed appropriate - dimer pending. Supplemental oxygen for O2 sats above 90%.

## 2021-10-11 NOTE — Consult Note (Addendum)
Theodore Obrien is a 71 y.o. male  [id number]  Primary Cardiologist: Dr. 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  Patient is comfortable denies chest pain at this time.   Review of Systems: No orthopnea PND or leg swelling   Past Medical History:  Diagnosis Date   CHF (congestive heart failure) (HCC)    COPD (chronic obstructive pulmonary disease) (HCC)    Dyspnea    GERD (gastroesophageal reflux disease)    History of kidney stones    Hypertension    Myocardial infarction ([place]) 2020    (Not in a hospital admission)     amLODipine  5 mg Oral Daily   aspirin EC  81 mg Oral Daily   atorvastatin  40 mg Oral Daily   carvedilol  3.125 mg Oral BID WC   cloNIDine  0.1 mg Oral TID   heparin  5,000 Units Subcutaneous Q12H   sodium chloride flush  3 mL Intravenous Q12H    Infusions:  sodium chloride Stopped (10/11/21 0201)   sodium chloride 75 mL/hr (10/11/21 0202)    Allergies  Allergen Reactions   Zyvox [Linezolid] Other (See Comments)    Nerve damage (neuropathy) Retinal hemmorhage    Social History   Socioeconomic History   Marital status: Legally Separated    Spouse name: Not on file   Number of children: Not on file   Years of education: Not on file   Highest education level: Not on file  Occupational History   Occupation: retired  Tobacco Use   Smoking status: Former    Packs/day: 1.00    Years: 6.00    Total pack years: 6.00    Types: Cigarettes    Quit date: 2018    Years since quitting: 5.4   Smokeless tobacco: Never   Tobacco comments:    quit smoking 2018  Vaping Use   Vaping Use: Never used  Substance and Sexual Activity   Alcohol use: Yes    Comment: occasionally beer   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   Sexual  activity: Yes  Other Topics Concern   Not on file  Social History Narrative   Not on file   Social Determinants of Health   Financial Resource Strain: Not on file  Food Insecurity: Not on file  Transportation Needs: Not on file  Physical Activity: Not on file  Stress: Not on file  Social Connections: Not on file  Intimate Partner Violence: Not on file    Family History  Problem Relation Age of Onset   Hypertension Mother    Heart disease Mother    Prostate cancer Father    Leukemia Father     PHYSICAL EXAM: Vitals:   10/11/21 0530 10/11/21 0600  BP: 125/87 110/69  Pulse: (!) 54 (!) 51  Resp: 18 18  Temp:    SpO2: 97% 97%     Intake/Output Summary (Last 24 hours) at 10/11/2021 0847 Last data filed at 10/11/2021 0201 Gross per 24 hour  Intake 1176.56 ml  Output --  Net 1176.56 ml    General:  Well appearing. No respiratory difficulty HEENT: normal Neck: supple. no JVD. Carotids 2+ bilat; no bruits. No lymphadenopathy or thryomegaly appreciated. Cor: PMI nondisplaced. Regular rate & rhythm.  No rubs, gallops or murmurs. Lungs: clear Abdomen: soft, nontender, nondistended. No hepatosplenomegaly. No bruits or masses. Good bowel sounds. Extremities: no cyanosis, clubbing, rash, edema Neuro: alert & oriented x 3, cranial nerves grossly intact. moves all 4 extremities w/o difficulty. Affect pleasant.  ECG: Normal sinus rhythm with right bundle branch block and nonspecific ST-T changes  Results for orders placed or performed during the hospital encounter of 10/10/21 (from the past 24 hour(s))  Comprehensive metabolic panel     Status: Abnormal   Collection Time: 10/10/21  9:45 PM  Result Value Ref Range   Sodium 132 (L) 135 - 145 mmol/L   Potassium 3.7 3.5 - 5.1 mmol/L   Chloride 93 (L) 98 - 111 mmol/L   CO2 26 22 - 32 mmol/L   Glucose, Bld 113 (H) 70 - 99 mg/dL   BUN 74 (H) 8 - 23 mg/dL   Creatinine, Ser 2.26 (H) 0.61 - 1.24 mg/dL   Calcium 8.4 (L) 8.9 - 10.3  mg/dL   Total Protein 7.4 6.5 - 8.1 g/dL   Albumin 3.9 3.5 - 5.0 g/dL   AST 18 15 - 41 U/L   ALT 18 0 - 44 U/L   Alkaline Phosphatase 64 38 - 126 U/L   Total Bilirubin 3.4 (H) 0.3 - 1.2 mg/dL   GFR, Estimated 31 (L) >60 mL/min   Anion gap 13 5 - 15  CBC with Differential     Status: Abnormal   Collection Time: 10/10/21  9:45 PM  Result Value Ref Range   WBC 13.4 (H) 4.0 - 10.5 K/uL   RBC 6.03 (H) 4.22 - 5.81 MIL/uL   Hemoglobin 16.8 13.0 - 17.0 g/dL   HCT 51.2 39.0 - 52.0 %   MCV 84.9 80.0 - 100.0 fL   MCH 27.9 26.0 - 34.0 pg   MCHC 32.8 30.0 - 36.0 g/dL   RDW 14.0 11.5 - 15.5 %   Platelets 187 150 - 400 K/uL   nRBC 0.0 0.0 - 0.2 %   Neutrophils Relative % 79 %   Neutro Abs 10.6 (H) 1.7 - 7.7 K/uL   Lymphocytes Relative 10 %   Lymphs Abs 1.3 0.7 - 4.0 K/uL   Monocytes Relative 10 %   Monocytes Absolute 1.4 (H) 0.1 - 1.0 K/uL   Eosinophils Relative 0 %   Eosinophils Absolute 0.0 0.0 - 0.5 K/uL   Basophils Relative 0 %   Basophils Absolute 0.0 0.0 - 0.1 K/uL   Immature Granulocytes 1 %   Abs Immature Granulocytes 0.07 0.00 - 0.07 K/uL  Blood Culture (routine x 2)     Status: None (Preliminary result)   Collection Time: 10/10/21  9:45 PM   Specimen: BLOOD  Result Value Ref Range   Specimen Description BLOOD BLOOD RIGHT WRIST    Special Requests      BOTTLES DRAWN AEROBIC AND ANAEROBIC Blood Culture adequate volume   Culture      NO GROWTH < 12 HOURS Performed at [facility], [address] [place], [place] [address]    Report Status PENDING   Blood Culture (routine x 2)     Status: None (Preliminary result)   Collection Time: 10/10/21  9:45 PM   Specimen: BLOOD  Result Value Ref Range   Specimen Description BLOOD LEFT ANTECUBITAL    Special Requests      BOTTLES DRAWN AEROBIC AND ANAEROBIC Blood Culture adequate volume   Culture      NO GROWTH < 12 HOURS Performed  at [facility], [address] [place], [place] [address]    Report Status  PENDING   Troponin I (High Sensitivity)     Status: Abnormal   Collection Time: 10/10/21  9:45 PM  Result Value Ref Range   Troponin I (High Sensitivity) 22 (H) <18 ng/L  D-dimer, quantitative     Status: Abnormal   Collection Time: 10/10/21  9:45 PM  Result Value Ref Range   D-Dimer, Quant 0.66 (H) 0.00 - 0.50 ug/mL-FEU  CK     Status: Abnormal   Collection Time: 10/10/21  9:45 PM  Result Value Ref Range   Total CK 29 (L) 49 - 397 U/L  Brain natriuretic peptide     Status: None   Collection Time: 10/10/21  9:45 PM  Result Value Ref Range   B Natriuretic Peptide 20.3 0.0 - 100.0 pg/mL  Ethanol     Status: None   Collection Time: 10/10/21  9:45 PM  Result Value Ref Range   Alcohol, Ethyl (B) <10 <10 mg/dL  TSH     Status: None   Collection Time: 10/10/21  9:45 PM  Result Value Ref Range   TSH 0.461 0.350 - 4.500 uIU/mL  Lactic acid, plasma     Status: None   Collection Time: 10/10/21  9:48 PM  Result Value Ref Range   Lactic Acid, Venous 1.1 0.5 - 1.9 mmol/L  Resp Panel by RT-PCR (Flu A&B, Covid) Anterior Nasal Swab     Status: None   Collection Time: 10/10/21 10:47 PM   Specimen: Anterior Nasal Swab  Result Value Ref Range   SARS Coronavirus 2 by RT PCR NEGATIVE NEGATIVE   Influenza A by PCR NEGATIVE NEGATIVE   Influenza B by PCR NEGATIVE NEGATIVE  Urinalysis, Complete w Microscopic Urine, Clean Catch     Status: Abnormal   Collection Time: 10/11/21 12:25 AM  Result Value Ref Range   Color, Urine YELLOW (A) YELLOW   APPearance HAZY (A) CLEAR   Specific Gravity, Urine 1.020 1.005 - 1.030   pH 5.0 5.0 - 8.0   Glucose, UA 150 (A) NEGATIVE mg/dL   Hgb urine dipstick NEGATIVE NEGATIVE   Bilirubin Urine NEGATIVE NEGATIVE   Ketones, ur NEGATIVE NEGATIVE mg/dL   Protein, ur 30 (A) NEGATIVE mg/dL   Nitrite NEGATIVE NEGATIVE   Leukocytes,Ua SMALL (A) NEGATIVE   RBC / HPF 0-5 0 - 5 RBC/hpf   WBC, UA 11-20 0 - 5 WBC/hpf   Bacteria, UA NONE SEEN NONE SEEN   Squamous Epithelial  / LPF NONE SEEN 0 - 5   Mucus PRESENT    Hyaline Casts, UA PRESENT   Lactate dehydrogenase     Status: None   Collection Time: 10/11/21  5:00 AM  Result Value Ref Range   LDH 126 98 - 192 U/L  Troponin I (High Sensitivity)     Status: Abnormal   Collection Time: 10/11/21  5:48 AM  Result Value Ref Range   Troponin I (High Sensitivity) 19 (H) <18 ng/L  Comprehensive metabolic panel     Status: Abnormal   Collection Time: 10/11/21  5:49 AM  Result Value Ref Range   Sodium 134 (L) 135 - 145 mmol/L   Potassium 3.5 3.5 - 5.1 mmol/L   Chloride 100 98 - 111 mmol/L   CO2 27 22 - 32 mmol/L   Glucose, Bld 133 (H) 70 - 99 mg/dL   BUN 63 (H) 8 - 23 mg/dL   Creatinine, Ser 1.67 (H) 0.61 -  1.24 mg/dL   Calcium 8.1 (L) 8.9 - 10.3 mg/dL   Total Protein 6.4 (L) 6.5 - 8.1 g/dL   Albumin 3.4 (L) 3.5 - 5.0 g/dL   AST 14 (L) 15 - 41 U/L   ALT 15 0 - 44 U/L   Alkaline Phosphatase 57 38 - 126 U/L   Total Bilirubin 3.1 (H) 0.3 - 1.2 mg/dL   GFR, Estimated 44 (L) >60 mL/min   Anion gap 7 5 - 15  CBC     Status: Abnormal   Collection Time: 10/11/21  5:49 AM  Result Value Ref Range   WBC 9.6 4.0 - 10.5 K/uL   RBC 5.40 4.22 - 5.81 MIL/uL   Hemoglobin 14.8 13.0 - 17.0 g/dL   HCT 46.0 39.0 - 52.0 %   MCV 85.2 80.0 - 100.0 fL   MCH 27.4 26.0 - 34.0 pg   MCHC 32.2 30.0 - 36.0 g/dL   RDW 13.8 11.5 - 15.5 %   Platelets 143 (L) 150 - 400 K/uL   nRBC 0.0 0.0 - 0.2 %   CT Renal Stone Study  Result Date: 10/10/2021 CLINICAL DATA:  Respiratory illness. Also flank pain. Concern for kidney stone. EXAM: CT CHEST, ABDOMEN AND PELVIS WITHOUT CONTRAST TECHNIQUE: Multidetector CT imaging of the chest, abdomen and pelvis was performed following the standard protocol without IV contrast. RADIATION DOSE REDUCTION: This exam was performed according to the departmental dose-optimization program which includes automated exposure control, adjustment of the mA and/or kV according to patient size and/or use of iterative  reconstruction technique. COMPARISON:  Chest radiograph dated 10/10/2021 and CT dated 09/17/2019 and 08/08/2020. FINDINGS: Evaluation of this exam is limited in the absence of intravenous contrast. CT CHEST FINDINGS Cardiovascular: There is no cardiomegaly or pericardial effusion. Advanced 3 vessel coronary vascular calcification. Mild atherosclerotic calcification of the thoracic aorta. No aneurysmal dilatation. The central pulmonary arteries are grossly unremarkable. Mediastinum/Nodes: No hilar or mediastinal adenopathy. The esophagus is grossly unremarkable. No mediastinal fluid collection. Lungs/Pleura: Minimal scarring in the lingula. A 4 mm left lower lobe subpleural nodule (139/3) similar to study of 2021. A 4 mm nodule along the right fissure (87/3) similar to 2021. A 3 mm right middle lobe nodule (131/3) similar to 2021. No focal consolidation, pleural effusion, pneumothorax. The central airways are patent. Musculoskeletal: Degenerative changes of the spine. No acute osseous pathology. CT ABDOMEN PELVIS FINDINGS No intra-abdominal free air or free fluid. Hepatobiliary: No focal liver abnormality is seen. No gallstones, gallbladder wall thickening, or biliary dilatation. Pancreas: Unremarkable. No pancreatic ductal dilatation or surrounding inflammatory changes. Spleen: Normal in size without focal abnormality. Adrenals/Urinary Tract: The adrenal glands unremarkable. Nonobstructing bilateral renal calculi measure up to 7 mm in the inferior pole of the left kidney. There is no hydronephrosis on either side. The visualized ureters and urinary bladder appear unremarkable. Stomach/Bowel: There is a 3 cm duodenal diverticulum. There is sigmoid diverticulosis with muscular hypertrophy. No active inflammatory changes. There is diffuse scattered colonic diverticula. There is no bowel obstruction or active inflammation. The appendix is normal. Vascular/Lymphatic: Moderate aortoiliac atherosclerotic disease. The IVC  is unremarkable. No portal venous gas. There is no adenopathy. Reproductive: The prostate and seminal vesicles are grossly unremarkable. No pelvic mass. Other: None Musculoskeletal: Multilevel degenerative changes with disc desiccation and vacuum phenomena. No acute osseous pathology. IMPRESSION: 1. No acute intrathoracic, abdominal, or pelvic pathology. 2. Nonobstructing bilateral renal calculi. No hydronephrosis. 3. Colonic diverticulosis. No bowel obstruction. Normal appendix. 4. Small bilateral pulmonary nodules measuring  up to 4 mm similar to study of 2021. No follow-up needed if patient is low-risk (and has no known or suspected primary neoplasm). Non-contrast chest CT can be considered in 12 months if patient is high-risk. This recommendation follows the consensus statement: Guidelines for Management of Incidental Pulmonary Nodules Detected on CT Images: From the Fleischner Society 2017; Radiology 2017; [id number]. 5. Aortic Atherosclerosis (ICD10-I70.0). Electronically Signed   By: Anner Crete M.D.   On: 10/10/2021 23:42   CT CHEST WO CONTRAST  Result Date: 10/10/2021 CLINICAL DATA:  Respiratory illness. Also flank pain. Concern for kidney stone. EXAM: CT CHEST, ABDOMEN AND PELVIS WITHOUT CONTRAST TECHNIQUE: Multidetector CT imaging of the chest, abdomen and pelvis was performed following the standard protocol without IV contrast. RADIATION DOSE REDUCTION: This exam was performed according to the departmental dose-optimization program which includes automated exposure control, adjustment of the mA and/or kV according to patient size and/or use of iterative reconstruction technique. COMPARISON:  Chest radiograph dated 10/10/2021 and CT dated 09/17/2019 and 08/08/2020. FINDINGS: Evaluation of this exam is limited in the absence of intravenous contrast. CT CHEST FINDINGS Cardiovascular: There is no cardiomegaly or pericardial effusion. Advanced 3 vessel coronary vascular calcification. Mild  atherosclerotic calcification of the thoracic aorta. No aneurysmal dilatation. The central pulmonary arteries are grossly unremarkable. Mediastinum/Nodes: No hilar or mediastinal adenopathy. The esophagus is grossly unremarkable. No mediastinal fluid collection. Lungs/Pleura: Minimal scarring in the lingula. A 4 mm left lower lobe subpleural nodule (139/3) similar to study of 2021. A 4 mm nodule along the right fissure (87/3) similar to 2021. A 3 mm right middle lobe nodule (131/3) similar to 2021. No focal consolidation, pleural effusion, pneumothorax. The central airways are patent. Musculoskeletal: Degenerative changes of the spine. No acute osseous pathology. CT ABDOMEN PELVIS FINDINGS No intra-abdominal free air or free fluid. Hepatobiliary: No focal liver abnormality is seen. No gallstones, gallbladder wall thickening, or biliary dilatation. Pancreas: Unremarkable. No pancreatic ductal dilatation or surrounding inflammatory changes. Spleen: Normal in size without focal abnormality. Adrenals/Urinary Tract: The adrenal glands unremarkable. Nonobstructing bilateral renal calculi measure up to 7 mm in the inferior pole of the left kidney. There is no hydronephrosis on either side. The visualized ureters and urinary bladder appear unremarkable. Stomach/Bowel: There is a 3 cm duodenal diverticulum. There is sigmoid diverticulosis with muscular hypertrophy. No active inflammatory changes. There is diffuse scattered colonic diverticula. There is no bowel obstruction or active inflammation. The appendix is normal. Vascular/Lymphatic: Moderate aortoiliac atherosclerotic disease. The IVC is unremarkable. No portal venous gas. There is no adenopathy. Reproductive: The prostate and seminal vesicles are grossly unremarkable. No pelvic mass. Other: None Musculoskeletal: Multilevel degenerative changes with disc desiccation and vacuum phenomena. No acute osseous pathology. IMPRESSION: 1. No acute intrathoracic, abdominal, or  pelvic pathology. 2. Nonobstructing bilateral renal calculi. No hydronephrosis. 3. Colonic diverticulosis. No bowel obstruction. Normal appendix. 4. Small bilateral pulmonary nodules measuring up to 4 mm similar to study of 2021. No follow-up needed if patient is low-risk (and has no known or suspected primary neoplasm). Non-contrast chest CT can be considered in 12 months if patient is high-risk. This recommendation follows the consensus statement: Guidelines for Management of Incidental Pulmonary Nodules Detected on CT Images: From the Fleischner Society 2017; Radiology 2017; [id number]. 5. Aortic Atherosclerosis (ICD10-I70.0). Electronically Signed   By: Anner Crete M.D.   On: 10/10/2021 23:42   DG Chest Port 1 View  Result Date: 10/10/2021 CLINICAL DATA:  Questionable sepsis. EXAM: PORTABLE CHEST 1 VIEW COMPARISON:  Chest  radiograph dated 08/08/2020. FINDINGS: No focal consolidation, pleural effusion, or pneumothorax. Stable cardiac silhouette. No acute osseous pathology. IMPRESSION: No active disease. Electronically Signed   By: Anner Crete M.D.   On: 10/10/2021 22:09     ASSESSMENT AND PLAN: Chest pain, with MI being ruled out and history of extensive coronary artery disease.  Will add nitrates and can be discharged with follow-up in the office.  Creatinine is 2.26 and thus will be reluctant to do cardiac catheterization at this time especially since patient is ruled out for myocardial infarction.  Echocardiogram shows ejection fraction 45 to 48% which is unchanged from prior echoes.  Will add isosorbide and can be discharged with follow-up with Dr. Nehemiah Massed in 1 week.  Assad Harbeson A

## 2021-10-11 NOTE — Assessment & Plan Note (Signed)
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  psych consult per am team.

## 2021-10-11 NOTE — Assessment & Plan Note (Signed)
Abdominal pain also with chest pain for past several days.  Not sure when last cocaine use was but pt is at high risk for atherosclerosis and blockages in mesenteric vessels with cocaine and h/o smoking.  Lactic is normal. LDH pending. Cannot do contrast study due to creatinine.  Ct abd noncontrast negative and without any hydronephrosis.  Iv ppi.

## 2021-10-11 NOTE — ED Notes (Signed)
Patient given phone to use

## 2021-10-11 NOTE — Assessment & Plan Note (Signed)
Lab Results  Component Value Date   CREATININE 2.26 (H) 10/10/2021   CREATININE 0.92 08/09/2020   CREATININE 0.98 08/08/2020  AKI cont with ivf hydration. No hydronephrosis on ct. Renally dose meds and abx.

## 2021-10-11 NOTE — Assessment & Plan Note (Addendum)
Blood pressure (!) 152/97, pulse 85, temperature 98.3 F (36.8 C), temperature source Oral, resp. rate 20, height '6\' 1"'$  (1.854 m), weight 81.6 kg, SpO2 94 %. continue amlodipine, clonidine, and   coreg. PRN hydralazine.

## 2021-10-11 NOTE — Assessment & Plan Note (Signed)
H/o thoracic aortic aneurysm. We will obtain CTA of chest for chest pain and also abd to identify and aneurysm or embolism or bowel ischemia once creatinine returns to normal.

## 2021-10-11 NOTE — Assessment & Plan Note (Signed)
uds pending.

## 2021-10-12 LAB — URINE CULTURE

## 2021-10-15 LAB — CULTURE, BLOOD (ROUTINE X 2)
Culture: NO GROWTH
Culture: NO GROWTH
Special Requests: ADEQUATE
Special Requests: ADEQUATE

## 2021-10-29 ENCOUNTER — Inpatient Hospital Stay
Admission: EM | Admit: 2021-10-29 | Discharge: 2021-10-30 | DRG: 189 | Discharge status: Against Medical Advice | Payer: Medicare HMO | Attending: Internal Medicine | Admitting: Internal Medicine

## 2021-10-29 DIAGNOSIS — Z87891 Personal history of nicotine dependence: Secondary | ICD-10-CM

## 2021-10-29 DIAGNOSIS — Z79899 Other long term (current) drug therapy: Secondary | ICD-10-CM

## 2021-10-29 DIAGNOSIS — J441 Chronic obstructive pulmonary disease with (acute) exacerbation: Secondary | ICD-10-CM

## 2021-10-29 DIAGNOSIS — I252 Old myocardial infarction: Secondary | ICD-10-CM

## 2021-10-29 DIAGNOSIS — Z5329 Procedure and treatment not carried out because of patient's decision for other reasons: Secondary | ICD-10-CM | POA: Diagnosis present

## 2021-10-29 DIAGNOSIS — N1832 Chronic kidney disease, stage 3b: Secondary | ICD-10-CM | POA: Diagnosis present

## 2021-10-29 DIAGNOSIS — N179 Acute kidney failure, unspecified: Secondary | ICD-10-CM

## 2021-10-29 DIAGNOSIS — J9601 Acute respiratory failure with hypoxia: Secondary | ICD-10-CM | POA: Diagnosis not present

## 2021-10-29 DIAGNOSIS — Z8249 Family history of ischemic heart disease and other diseases of the circulatory system: Secondary | ICD-10-CM

## 2021-10-29 DIAGNOSIS — Z7982 Long term (current) use of aspirin: Secondary | ICD-10-CM

## 2021-10-29 DIAGNOSIS — I4510 Unspecified right bundle-branch block: Secondary | ICD-10-CM | POA: Diagnosis present

## 2021-10-29 DIAGNOSIS — I130 Hypertensive heart and chronic kidney disease with heart failure and stage 1 through stage 4 chronic kidney disease, or unspecified chronic kidney disease: Secondary | ICD-10-CM | POA: Diagnosis present

## 2021-10-29 DIAGNOSIS — Z7902 Long term (current) use of antithrombotics/antiplatelets: Secondary | ICD-10-CM

## 2021-10-29 DIAGNOSIS — Z87442 Personal history of urinary calculi: Secondary | ICD-10-CM

## 2021-10-29 DIAGNOSIS — J9602 Acute respiratory failure with hypercapnia: Secondary | ICD-10-CM | POA: Diagnosis present

## 2021-10-29 DIAGNOSIS — I160 Hypertensive urgency: Secondary | ICD-10-CM | POA: Diagnosis present

## 2021-10-29 DIAGNOSIS — I2510 Atherosclerotic heart disease of native coronary artery without angina pectoris: Secondary | ICD-10-CM | POA: Diagnosis present

## 2021-10-29 DIAGNOSIS — Z8042 Family history of malignant neoplasm of prostate: Secondary | ICD-10-CM

## 2021-10-29 DIAGNOSIS — Z806 Family history of leukemia: Secondary | ICD-10-CM

## 2021-10-29 MED ORDER — IPRATROPIUM-ALBUTEROL 0.5-2.5 (3) MG/3ML IN SOLN
RESPIRATORY_TRACT | Status: AC
  Filled 2021-10-29: qty 9

## 2021-10-29 MED ORDER — LACTATED RINGERS IV BOLUS (SEPSIS)
1000.0000 mL | Freq: Once | INTRAVENOUS | Status: AC
  Administered 2021-10-30: 1000 mL via INTRAVENOUS

## 2021-10-29 NOTE — ED Provider Notes (Signed)
[facility] Provider Note    Event Date/Time   First MD Initiated Contact with Patient 10/29/21 2354     (approximate)   History   Respiratory Distress   HPI Level 5 caveat:  history/ROS limited by acute/critical illness  Theodore Obrien is a 71 y.o. male with a known history of COPD as well as opioid dependence, history of prior hypertensive urgency, and reported history of IV drug use.  He also has a history in the computer of acute cardiogenic pulmonary edema but he denies any history of CHF.  The patient presents in severe respiratory distress by EMS.  He is not able to provide much history due to his respiratory difficulty.  Paramedics report that they were called out for respiratory difficulty and found that his O2 sat on room air was below 50% and he looked cyanotic but was awake and alert.  He was moving very minimal air.  They started him on a nonrebreather to get him out to the ambulance which improved his color.  They then put him on CPAP for transport to [facility].  Medications received prior to ED arrival included Solu-Medrol 125 mg IV, 2 albuterol breathing treatments, and magnesium 2 g IV.  Patient reports that he feels a little bit better but not much.  He denies any pain and any nausea or vomiting.  Symptoms have been reportedly getting worse over the last couple of days.     Physical Exam   ED Triage Vitals  Enc Vitals Group     BP 10/30/21 0001 (!) 188/107     Pulse Rate 10/30/21 0001 (!) 131     Resp 10/30/21 0001 20     Temp 10/30/21 0001 (!) 97.4 F (36.3 C)     Temp Source 10/30/21 0001 Axillary     SpO2 10/30/21 0001 (!) 86 %     Weight 10/30/21 0002 81.6 kg (180 lb)     Height 10/30/21 0002 1.854 m ('6\' 1"'$ )     Head Circumference --      Peak Flow --      Pain Score 10/30/21 0002 0     Pain Loc --      Pain Edu? --      Excl. in [place]? --       General: Awake, disheveled, severe respiratory distress CV:  Normal heart  sounds Resp:  Severe respiratory distress with increased respiratory effort, accessory muscle usage, intercostal retractions.  Minimal air movement throughout; tight lung sounds on the left with decreased sounds on the right compared to the left. Abd:  No distention.  No tenderness to palpation. Other:  Patient is awake and alert, no altered mental status at this time.   ED Results / Procedures / Treatments   Labs (all labs ordered are listed, but only abnormal results are displayed) Labs Reviewed  BLOOD GAS, VENOUS - Abnormal; Notable for the following components:      Result Value   pH, Ven 7.2 (*)    pCO2, Ven 71 (*)    pO2, Ven 46 (*)    All other components within normal limits  COMPREHENSIVE METABOLIC PANEL - Abnormal; Notable for the following components:   Glucose, Bld 158 (*)    BUN 32 (*)    Creatinine, Ser 2.39 (*)    Calcium 8.6 (*)    Total Bilirubin 2.1 (*)    GFR, Estimated 29 (*)    All other components within normal limits  BRAIN NATRIURETIC  PEPTIDE - Abnormal; Notable for the following components:   B Natriuretic Peptide 110.0 (*)    All other components within normal limits  CBC WITH DIFFERENTIAL/PLATELET  LIPASE, BLOOD  PROTIME-INR  MAGNESIUM     EKG  ED ECG REPORT I, Hinda Kehr, the attending physician, personally viewed and interpreted this ECG.  Date: 10/30/2021 EKG Time: 00: 12 Rate: 122 Rhythm: Sinus tachycardia with multiple PVCs QRS Axis: normal Intervals: Right bundle branch block ST/T Wave abnormalities: Non-specific ST segment / T-wave changes, but no clear evidence of acute ischemia. Narrative Interpretation: no definitive evidence of acute ischemia; does not meet STEMI criteria.    RADIOLOGY See hospital course for details: Chronic COPD, no evidence of pneumonia on chest x-ray    PROCEDURES:  Critical Care performed: Yes, see critical care procedure note(s)  .1-3 Lead EKG Interpretation  Performed by: Hinda Kehr,  MD Authorized by: Hinda Kehr, MD   .Critical Care  Performed by: Hinda Kehr, MD Authorized by: Hinda Kehr, MD   Critical care provider statement:    Critical care time (minutes):  30   Critical care time was exclusive of:  Separately billable procedures and treating other patients   Critical care was necessary to treat or prevent imminent or life-threatening deterioration of the following conditions:  Respiratory failure   Critical care was time spent personally by me on the following activities:  Development of treatment plan with patient or surrogate, evaluation of patient's response to treatment, examination of patient, obtaining history from patient or surrogate, ordering and performing treatments and interventions, ordering and review of laboratory studies, ordering and review of radiographic studies, pulse oximetry, re-evaluation of patient's condition and review of old charts    MEDICATIONS ORDERED IN ED: Medications  ipratropium-albuterol (DUONEB) 0.5-2.5 (3) MG/3ML nebulizer solution (  Given 10/30/21 0007)  lactated ringers bolus 1,000 mL (0 mLs Intravenous Stopped 10/30/21 0117)     IMPRESSION / MDM / [place] / ED COURSE  I reviewed the triage vital signs and the nursing notes.                             Differential diagnosis includes, but is not limited to, COPD exacerbation with acute respiratory failure, pneumothorax, ACS, CHF.  Patient's presentation is most consistent with acute presentation with potential threat to life or bodily function.  Patient is in severe respiratory distress and we are moving to BiPAP.  I ordered the following labs/studies: EKG, VBG, CBC with differential, BNP, magnesium, pro time-INR, lipase, comprehensive metabolic panel, portable 1 view chest x-ray.  The patient is on the cardiac monitor to evaluate for evidence of arrhythmia and/or significant heart rate changes.  I ordered 3 DuoNebs to be administered in line with the  BiPAP.  I was able to confirm that the patient is full code and wants to be intubated should the BiPAP would not be successful in improving his respiratory status.  Based on his current appearance this is a very strong possibility but I am hopeful he will turn around with BiPAP and once the medications start to take effect.   Clinical Course as of 10/30/21 0120  Sat Oct 30, 2021  0024 Blood gas, venous(!!) PCO2 elevated at 71.  We will continue to monitor as the patient is on BiPAP. [CF]  0025 CBC with Differential Normal CBC [CF]  0033 DG Chest [facility] I viewed and interpreted the patient's 1 view chest x-ray.  It appears consistent with hyperinflation and chronic COPD but without any acute findings such as pneumonia or pneumothorax.  The radiologist findings are similar with no abnormal findings. [CF]  0051 Comprehensive metabolic panel(!) Patient has a degree of acute kidney injury but looking back through his labs, his creatinine is extremely variable.  I suspect he is somewhat volume depleted due to insensible losses from his COPD exacerbation.  Otherwise his labs are generally reassuring.   [CF]  0101 Reassessed the patient.  He is moving much more air and is able to speak in full sentences.  We will continue on BiPAP, however.  I am consulting the hospitalist for admission.  He agrees with the plan. [CF]  0117 Consulted Dr. Sidney Ace with the hospitalist service by secure chat text and he will admit the patient. [CF]    Clinical Course User Index [CF] Hinda Kehr, MD     FINAL CLINICAL IMPRESSION(S) / ED DIAGNOSES   Final diagnoses:  COPD exacerbation ([place])  Acute respiratory failure with hypoxia and hypercapnia ([place])  AKI (acute kidney injury) ([place])     Rx / DC Orders   ED Discharge Orders     None        Note:  This document was prepared using Dragon voice recognition software and may include unintentional dictation errors.   Hinda Kehr, MD 10/30/21 0120

## 2021-10-29 NOTE — ED Triage Notes (Signed)
Pt comes from home via [facility] with c/o of respiratory distress. Pt O2 sat 50% upon arrival. EMS placed pt on NRB satting 70% then placed on cpap. O2 sat 75 on cpap. Pt does not usually wear O2 at home. Hx of COPD

## 2021-10-30 ENCOUNTER — Emergency Department: Payer: Medicare HMO

## 2021-10-30 DIAGNOSIS — Z8042 Family history of malignant neoplasm of prostate: Secondary | ICD-10-CM | POA: Diagnosis not present

## 2021-10-30 DIAGNOSIS — J9601 Acute respiratory failure with hypoxia: Secondary | ICD-10-CM | POA: Diagnosis present

## 2021-10-30 DIAGNOSIS — J441 Chronic obstructive pulmonary disease with (acute) exacerbation: Secondary | ICD-10-CM

## 2021-10-30 DIAGNOSIS — Z87891 Personal history of nicotine dependence: Secondary | ICD-10-CM | POA: Diagnosis not present

## 2021-10-30 DIAGNOSIS — Z5329 Procedure and treatment not carried out because of patient's decision for other reasons: Secondary | ICD-10-CM | POA: Diagnosis present

## 2021-10-30 DIAGNOSIS — I2510 Atherosclerotic heart disease of native coronary artery without angina pectoris: Secondary | ICD-10-CM | POA: Diagnosis present

## 2021-10-30 DIAGNOSIS — I252 Old myocardial infarction: Secondary | ICD-10-CM | POA: Diagnosis not present

## 2021-10-30 DIAGNOSIS — Z7902 Long term (current) use of antithrombotics/antiplatelets: Secondary | ICD-10-CM | POA: Diagnosis not present

## 2021-10-30 DIAGNOSIS — Z7982 Long term (current) use of aspirin: Secondary | ICD-10-CM | POA: Diagnosis not present

## 2021-10-30 DIAGNOSIS — J9602 Acute respiratory failure with hypercapnia: Secondary | ICD-10-CM

## 2021-10-30 DIAGNOSIS — I160 Hypertensive urgency: Secondary | ICD-10-CM | POA: Diagnosis present

## 2021-10-30 DIAGNOSIS — Z8249 Family history of ischemic heart disease and other diseases of the circulatory system: Secondary | ICD-10-CM | POA: Diagnosis not present

## 2021-10-30 DIAGNOSIS — Z806 Family history of leukemia: Secondary | ICD-10-CM | POA: Diagnosis not present

## 2021-10-30 DIAGNOSIS — I130 Hypertensive heart and chronic kidney disease with heart failure and stage 1 through stage 4 chronic kidney disease, or unspecified chronic kidney disease: Secondary | ICD-10-CM | POA: Diagnosis present

## 2021-10-30 DIAGNOSIS — N179 Acute kidney failure, unspecified: Secondary | ICD-10-CM | POA: Diagnosis present

## 2021-10-30 DIAGNOSIS — Z87442 Personal history of urinary calculi: Secondary | ICD-10-CM | POA: Diagnosis not present

## 2021-10-30 DIAGNOSIS — Z79899 Other long term (current) drug therapy: Secondary | ICD-10-CM | POA: Diagnosis not present

## 2021-10-30 DIAGNOSIS — N1832 Chronic kidney disease, stage 3b: Secondary | ICD-10-CM | POA: Diagnosis present

## 2021-10-30 DIAGNOSIS — N189 Chronic kidney disease, unspecified: Secondary | ICD-10-CM

## 2021-10-30 DIAGNOSIS — I4510 Unspecified right bundle-branch block: Secondary | ICD-10-CM | POA: Diagnosis present

## 2021-10-30 LAB — BASIC METABOLIC PANEL
Anion gap: 5 (ref 5–15)
BUN: 34 mg/dL — ABNORMAL HIGH (ref 8–23)
CO2: 27 mmol/L (ref 22–32)
Calcium: 8.7 mg/dL — ABNORMAL LOW (ref 8.9–10.3)
Chloride: 106 mmol/L (ref 98–111)
Creatinine, Ser: 2.14 mg/dL — ABNORMAL HIGH (ref 0.61–1.24)
GFR, Estimated: 33 mL/min — ABNORMAL LOW (ref >60)
Glucose, Bld: 245 mg/dL — ABNORMAL HIGH (ref 70–99)
Potassium: 5 mmol/L (ref 3.5–5.1)
Sodium: 138 mmol/L (ref 135–145)

## 2021-10-30 LAB — CBC WITH DIFFERENTIAL/PLATELET
Abs Immature Granulocytes: 0.05 10*3/uL (ref 0.00–0.07)
Basophils Absolute: 0 10*3/uL (ref 0.0–0.1)
Basophils Relative: 0 %
Eosinophils Absolute: 0.2 10*3/uL (ref 0.0–0.5)
Eosinophils Relative: 2 %
HCT: 47.5 % (ref 39.0–52.0)
Hemoglobin: 14.5 g/dL (ref 13.0–17.0)
Immature Granulocytes: 1 %
Lymphocytes Relative: 19 %
Lymphs Abs: 1.7 10*3/uL (ref 0.7–4.0)
MCH: 27.7 pg (ref 26.0–34.0)
MCHC: 30.5 g/dL (ref 30.0–36.0)
MCV: 90.6 fL (ref 80.0–100.0)
Monocytes Absolute: 0.6 10*3/uL (ref 0.1–1.0)
Monocytes Relative: 6 %
Neutro Abs: 6.5 10*3/uL (ref 1.7–7.7)
Neutrophils Relative %: 72 %
Platelets: 203 10*3/uL (ref 150–400)
RBC: 5.24 MIL/uL (ref 4.22–5.81)
RDW: 13.4 % (ref 11.5–15.5)
WBC: 9.1 10*3/uL (ref 4.0–10.5)
nRBC: 0 % (ref 0.0–0.2)

## 2021-10-30 LAB — COMPREHENSIVE METABOLIC PANEL
ALT: 34 U/L (ref 0–44)
AST: 39 U/L (ref 15–41)
Albumin: 4.5 g/dL (ref 3.5–5.0)
Alkaline Phosphatase: 86 U/L (ref 38–126)
Anion gap: 7 (ref 5–15)
BUN: 32 mg/dL — ABNORMAL HIGH (ref 8–23)
CO2: 27 mmol/L (ref 22–32)
Calcium: 8.6 mg/dL — ABNORMAL LOW (ref 8.9–10.3)
Chloride: 108 mmol/L (ref 98–111)
Creatinine, Ser: 2.39 mg/dL — ABNORMAL HIGH (ref 0.61–1.24)
GFR, Estimated: 29 mL/min — ABNORMAL LOW (ref >60)
Glucose, Bld: 158 mg/dL — ABNORMAL HIGH (ref 70–99)
Potassium: 4.6 mmol/L (ref 3.5–5.1)
Sodium: 142 mmol/L (ref 135–145)
Total Bilirubin: 2.1 mg/dL — ABNORMAL HIGH (ref 0.3–1.2)
Total Protein: 7.9 g/dL (ref 6.5–8.1)

## 2021-10-30 LAB — CBC
HCT: 42.6 % (ref 39.0–52.0)
Hemoglobin: 13 g/dL (ref 13.0–17.0)
MCH: 27.8 pg (ref 26.0–34.0)
MCHC: 30.5 g/dL (ref 30.0–36.0)
MCV: 91.2 fL (ref 80.0–100.0)
Platelets: 146 10*3/uL — ABNORMAL LOW (ref 150–400)
RBC: 4.67 MIL/uL (ref 4.22–5.81)
RDW: 13.5 % (ref 11.5–15.5)
WBC: 6.4 10*3/uL (ref 4.0–10.5)
nRBC: 0 % (ref 0.0–0.2)

## 2021-10-30 LAB — MAGNESIUM: Magnesium: 1.8 mg/dL (ref 1.7–2.4)

## 2021-10-30 LAB — PROTIME-INR
INR: 1.1 (ref 0.8–1.2)
Prothrombin Time: 13.7 seconds (ref 11.4–15.2)

## 2021-10-30 LAB — LIPASE, BLOOD: Lipase: 45 U/L (ref 11–51)

## 2021-10-30 LAB — BRAIN NATRIURETIC PEPTIDE: B Natriuretic Peptide: 110 pg/mL — ABNORMAL HIGH (ref 0.0–100.0)

## 2021-10-30 MED ORDER — ALBUTEROL SULFATE HFA 108 (90 BASE) MCG/ACT IN AERS: 2.0000 | INHALATION_SPRAY | Freq: Four times a day (QID) | RESPIRATORY_TRACT | Status: DC | PRN

## 2021-10-30 MED ORDER — GUAIFENESIN ER 600 MG PO TB12
600.0000 mg | ORAL_TABLET | Freq: Two times a day (BID) | ORAL | Status: DC
  Administered 2021-10-30: 600 mg via ORAL
  Filled 2021-10-30: qty 1

## 2021-10-30 MED ORDER — ACETAMINOPHEN 325 MG PO TABS: 650.0000 mg | ORAL_TABLET | Freq: Four times a day (QID) | ORAL | Status: DC | PRN

## 2021-10-30 MED ORDER — CARVEDILOL 6.25 MG PO TABS
3.1250 mg | ORAL_TABLET | Freq: Two times a day (BID) | ORAL | Status: DC
  Administered 2021-10-30: 3.125 mg via ORAL
  Filled 2021-10-30: qty 1

## 2021-10-30 MED ORDER — ALPRAZOLAM 0.5 MG PO TABS: 0.2500 mg | ORAL_TABLET | Freq: Two times a day (BID) | ORAL | Status: DC | PRN

## 2021-10-30 MED ORDER — PREDNISONE 20 MG PO TABS: 40.0000 mg | ORAL_TABLET | Freq: Every day | ORAL | Status: DC

## 2021-10-30 MED ORDER — CLOPIDOGREL BISULFATE 75 MG PO TABS
75.0000 mg | ORAL_TABLET | Freq: Every day | ORAL | Status: DC
  Administered 2021-10-30: 75 mg via ORAL
  Filled 2021-10-30: qty 1

## 2021-10-30 MED ORDER — SODIUM CHLORIDE 0.9 % IV SOLN
1.0000 g | Freq: Every day | INTRAVENOUS | Status: DC
  Administered 2021-10-30: 1 g via INTRAVENOUS
  Filled 2021-10-30: qty 10

## 2021-10-30 MED ORDER — ISOSORBIDE MONONITRATE ER 60 MG PO TB24
30.0000 mg | ORAL_TABLET | Freq: Every day | ORAL | Status: DC
  Administered 2021-10-30: 30 mg via ORAL
  Filled 2021-10-30: qty 1

## 2021-10-30 MED ORDER — TRAZODONE HCL 50 MG PO TABS: 25.0000 mg | ORAL_TABLET | Freq: Every evening | ORAL | Status: DC | PRN

## 2021-10-30 MED ORDER — PANTOPRAZOLE SODIUM 40 MG PO TBEC
40.0000 mg | DELAYED_RELEASE_TABLET | Freq: Two times a day (BID) | ORAL | Status: DC
  Administered 2021-10-30: 40 mg via ORAL
  Filled 2021-10-30: qty 1

## 2021-10-30 MED ORDER — ENOXAPARIN SODIUM 40 MG/0.4ML IJ SOSY
40.0000 mg | PREFILLED_SYRINGE | INTRAMUSCULAR | Status: DC
  Administered 2021-10-30: 40 mg via SUBCUTANEOUS
  Filled 2021-10-30: qty 0.4

## 2021-10-30 MED ORDER — AMLODIPINE BESYLATE 5 MG PO TABS
5.0000 mg | ORAL_TABLET | Freq: Every day | ORAL | Status: DC
  Administered 2021-10-30: 5 mg via ORAL
  Filled 2021-10-30: qty 1

## 2021-10-30 MED ORDER — ASPIRIN 81 MG PO TBEC
81.0000 mg | DELAYED_RELEASE_TABLET | Freq: Every day | ORAL | Status: DC
  Administered 2021-10-30: 81 mg via ORAL
  Filled 2021-10-30: qty 1

## 2021-10-30 MED ORDER — MAGNESIUM HYDROXIDE 400 MG/5ML PO SUSP: 30.0000 mL | Freq: Every day | ORAL | Status: DC | PRN

## 2021-10-30 MED ORDER — ALBUTEROL SULFATE (2.5 MG/3ML) 0.083% IN NEBU: 2.5000 mg | INHALATION_SOLUTION | Freq: Four times a day (QID) | RESPIRATORY_TRACT | Status: DC | PRN

## 2021-10-30 MED ORDER — CLONIDINE HCL 0.1 MG PO TABS
0.1000 mg | ORAL_TABLET | Freq: Three times a day (TID) | ORAL | Status: DC
  Administered 2021-10-30: 0.1 mg via ORAL
  Filled 2021-10-30: qty 1

## 2021-10-30 MED ORDER — ACETAMINOPHEN 325 MG RE SUPP: 650.0000 mg | Freq: Four times a day (QID) | RECTAL | Status: DC | PRN

## 2021-10-30 MED ORDER — SODIUM CHLORIDE 0.9 % IV SOLN: INTRAVENOUS | Status: DC

## 2021-10-30 MED ORDER — IPRATROPIUM-ALBUTEROL 0.5-2.5 (3) MG/3ML IN SOLN
3.0000 mL | Freq: Four times a day (QID) | RESPIRATORY_TRACT | Status: DC
  Administered 2021-10-30 (×2): 3 mL via RESPIRATORY_TRACT
  Filled 2021-10-30 (×2): qty 3

## 2021-10-30 MED ORDER — ATORVASTATIN CALCIUM 20 MG PO TABS
40.0000 mg | ORAL_TABLET | Freq: Every day | ORAL | Status: DC
  Administered 2021-10-30: 40 mg via ORAL
  Filled 2021-10-30: qty 2

## 2021-10-30 MED ORDER — METHYLPREDNISOLONE SODIUM SUCC 40 MG IJ SOLR
40.0000 mg | Freq: Two times a day (BID) | INTRAMUSCULAR | Status: DC
  Administered 2021-10-30: 40 mg via INTRAVENOUS
  Filled 2021-10-30: qty 1

## 2021-10-30 NOTE — Discharge Summary (Signed)
Physician Discharge Summary  Theodore Obrien WCH:[id number] DOB: [birth year] DOA: 10/29/2021  PCP: Theodore Harrier, MD  Admit date: 10/29/2021 Discharge date: 10/30/2021  Admitted From: home  Disposition:  Pt left AMA  Recommendations for Outpatient Follow-up:  Pt left AMA  Home Health: no Equipment/Devices:  Discharge Condition: guarded  CODE STATUS: full  Diet recommendation: Heart Healthy    Brief/Interim Summary: HPI was taken from Dr. Sidney Obrien: Theodore Obrien is a 71 y.o. Caucasian male with medical history significant for COPD, CHF, GERD, hypertension and coronary artery disease, who presented to the ER with acute onset of worsening dyspnea with associated cough productive of clear sputum as well as wheezing that started earlier during the day.  He denies any fever or chills.  No chest pain STATIONS.  No nausea or vomiting or abdominal pain.  He has no nebulizer at home but has been using his bronchodilator inhalers without help.  No dysuria, oliguria or hematuria or flank pain.   ED Course: When he came to the ER, BP was elevated and 188/107 with tachycardia of 131 and respiratory it was 22.  Pulse oximetry was 78% on room air and initially on BiPAP 86% then was up to 97%.  Labs revealed VBG with with PCO2 of 71 and pH 7.2 with HCO3 27.7 and CMP was remarkable for a BUN of 32 and creatinine 2.39 above previous levels consistent with stage IIIb CKD.  CBC was within normal.  Blood glucose was 158. EKG as reviewed by me : Sinus tachycardia with a rate of 122 with multiple PVCs and right bundle branch block. Imaging: Portable chest ray showed COPD changes with no acute abnormalities.  The patient was given 1 L bolus of IV lactated Ringer.  He was given 125 mg of IV Solu-Medrol by EMS as well as 2 albuterol nebulizer therapy and 2 g of IV magnesium sulfate.  He will be admitted to a PCU bed for further evaluation and management.   Pt was treated w/ IV abxs, steroids, bronchodilators,  supplemental oxygen & incentive spirometry. Pt was still on supplemental oxygen when pt decided to leave AMA. Pt stated he "stuff to do this holiday weekend."   Discharge Diagnoses:  Principal Problem:   Acute respiratory failure with hypoxia and hypercapnia (HCC) Active Problems:   COPD with acute exacerbation (HCC)   Acute kidney injury superimposed on chronic kidney disease (HCC)   CAD (coronary artery disease)   Hypertensive urgency  Acute hypoxic & hypercapnic respiratory failure: previously on BiPAP which has since been weaned off. Continue on supplemental oxygen and wean as tolerated, currently on HFNC. Likely secondary to COPD exacerbation    COPD exacerbation: continue on IV rocephin, bronchodilators, steroids & encourage incentive spirometry    AKI on CKDIIIb: Cr is labile    Hx of CAD: continue on coreg, imdur, statin, aspirin    Hypertensive urgency: urgency resolved but still w/ HTN. Continue on coreg, imdur, amlodipine, clonidine   Discharge Instructions   Pt left AMA   Allergies  Allergen Reactions   Zyvox [Linezolid] Other (See Comments)    Nerve damage (neuropathy) Retinal hemmorhage    Consultations:    Procedures/Studies: DG Chest Port 1 View  Result Date: 10/30/2021 CLINICAL DATA:  Questionable sepsis - evaluate for abnormality Shortness of breath. EXAM: PORTABLE CHEST 1 VIEW COMPARISON:  Radiograph and CT 10/10/2021 FINDINGS: Upper normal heart size, unchanged. Aortic atherosclerosis and tortuosity. Chronic hyperinflation and bronchial thickening. No focal airspace disease, pleural effusion, or pneumothorax. Grossly stable osseous  structures. IMPRESSION: 1. No acute abnormality. 2. Chronic hyperinflation and bronchial thickening, consistent with COPD. Electronically Signed   By: Theodore Obrien M.D.   On: 10/30/2021 00:30   ECHOCARDIOGRAM COMPLETE  Result Date: 10/11/2021    ECHOCARDIOGRAM REPORT   Patient Name:   Theodore Obrien Date of Exam: 10/11/2021  Medical Rec #:  [id number]     Height:       73.0 in Accession #:    [id number]    Weight:       180.0 lb Date of Birth:  June 16, 1950      BSA:          2.057 m Patient Age:    71 years      BP:           110/69 mmHg Patient Gender: M             HR:           60 bpm. Exam Location:  [facility] Procedure: 2D Echo, Cardiac Doppler and Color Doppler Indications:     Shortness of breath  History:         Patient has prior history of Echocardiogram examinations. CHF,                  CAD and Previous Myocardial Infarction, Cardiac stent, COPD;                  Risk Factors:Former Smoker, Hypertension and Dyslipidemia.  Sonographer:     Theodore Obrien Referring Phys:  Theodore Obrien Diagnosing Phys: Theodore Obrien  Sonographer Comments: Suboptimal apical window. Image acquisition challenging due to respiratory motion. IMPRESSIONS  1. Left ventricular ejection fraction, by estimation, is 45 to 50%. The left ventricle has mildly decreased function. The left ventricle demonstrates global hypokinesis. The left ventricular internal cavity size was mildly dilated. Left ventricular diastolic parameters are indeterminate.  2. Right ventricular systolic function is normal. The right ventricular size is normal.  3. Left atrial size was mildly dilated.  4. Right atrial size was mildly dilated.  5. The mitral valve is normal in structure. Trivial mitral valve regurgitation. No evidence of mitral stenosis.  6. The aortic valve is normal in structure. Aortic valve regurgitation is not visualized. No aortic stenosis is present.  7. The inferior vena cava is normal in size with greater than 50% respiratory variability, suggesting right atrial pressure of 3 mmHg. FINDINGS  Left Ventricle: Left ventricular ejection fraction, by estimation, is 45 to 50%. The left ventricle has mildly decreased function. The left ventricle demonstrates global hypokinesis. The left ventricular internal cavity size was mildly dilated. There is  no left ventricular  hypertrophy. Left ventricular diastolic parameters are indeterminate. Right Ventricle: The right ventricular size is normal. No increase in right ventricular wall thickness. Right ventricular systolic function is normal. Left Atrium: Left atrial size was mildly dilated. Right Atrium: Right atrial size was mildly dilated. Pericardium: There is no evidence of pericardial effusion. Mitral Valve: The mitral valve is normal in structure. Trivial mitral valve regurgitation. No evidence of mitral valve stenosis. Tricuspid Valve: The tricuspid valve is normal in structure. Tricuspid valve regurgitation is mild . No evidence of tricuspid stenosis. Aortic Valve: The aortic valve is normal in structure. Aortic valve regurgitation is not visualized. No aortic stenosis is present. Aortic valve mean gradient measures 2.0 mmHg. Aortic valve peak gradient measures 3.1 mmHg. Aortic valve area, by VTI measures 3.06 cm. Pulmonic Valve: The pulmonic valve was normal in  structure. Pulmonic valve regurgitation is not visualized. No evidence of pulmonic stenosis. Aorta: The aortic root is normal in size and structure. Venous: The inferior vena cava is normal in size with greater than 50% respiratory variability, suggesting right atrial pressure of 3 mmHg. IAS/Shunts: No atrial level shunt detected by color flow Doppler.  LEFT VENTRICLE PLAX 2D LVIDd:         4.17 cm   Diastology LVIDs:         3.16 cm   LV e' medial:  4.24 cm/s LV PW:         1.59 cm   LV e' lateral: 5.87 cm/s LV IVS:        1.34 cm LVOT diam:     2.10 cm LV SV:         55 LV SV Index:   27 LVOT Area:     3.46 cm  RIGHT VENTRICLE RV S prime:     14.40 cm/s LEFT ATRIUM         Index LA diam:    4.00 cm 1.94 cm/m  AORTIC VALVE                    PULMONIC VALVE AV Area (Vmax):    3.10 cm     PV Vmax:       0.84 m/s AV Area (Vmean):   3.10 cm     PV Vmean:      62.900 cm/s AV Area (VTI):     3.06 cm     PV VTI:        0.183 m AV Vmax:           87.50 cm/s   PV Peak  grad:  2.8 mmHg AV Vmean:          66.900 cm/s  PV Mean grad:  2.0 mmHg AV VTI:            0.179 m AV Peak Grad:      3.1 mmHg AV Mean Grad:      2.0 mmHg LVOT Vmax:         78.30 cm/s LVOT Vmean:        59.900 cm/s LVOT VTI:          0.158 m LVOT/AV VTI ratio: 0.88  AORTA Ao Root diam: 4.00 cm  SHUNTS Systemic VTI:  0.16 m Systemic Diam: 2.10 cm Theodore Obrien Electronically signed by Theodore Obrien Signature Date/Time: 10/11/2021/10:22:49 AM    Final    CT Renal Stone Study  Result Date: 10/10/2021 CLINICAL DATA:  Respiratory illness. Also flank pain. Concern for kidney stone. EXAM: CT CHEST, ABDOMEN AND PELVIS WITHOUT CONTRAST TECHNIQUE: Multidetector CT imaging of the chest, abdomen and pelvis was performed following the standard protocol without IV contrast. RADIATION DOSE REDUCTION: This exam was performed according to the departmental dose-optimization program which includes automated exposure control, adjustment of the mA and/or kV according to patient size and/or use of iterative reconstruction technique. COMPARISON:  Chest radiograph dated 10/10/2021 and CT dated 09/17/2019 and 08/08/2020. FINDINGS: Evaluation of this exam is limited in the absence of intravenous contrast. CT CHEST FINDINGS Cardiovascular: There is no cardiomegaly or pericardial effusion. Advanced 3 vessel coronary vascular calcification. Mild atherosclerotic calcification of the thoracic aorta. No aneurysmal dilatation. The central pulmonary arteries are grossly unremarkable. Mediastinum/Nodes: No hilar or mediastinal adenopathy. The esophagus is grossly unremarkable. No mediastinal fluid collection. Lungs/Pleura: Minimal scarring in the lingula. A 4 mm left lower lobe subpleural nodule (  139/3) similar to study of 2021. A 4 mm nodule along the right fissure (87/3) similar to 2021. A 3 mm right middle lobe nodule (131/3) similar to 2021. No focal consolidation, pleural effusion, pneumothorax. The central airways are patent. Musculoskeletal:  Degenerative changes of the spine. No acute osseous pathology. CT ABDOMEN PELVIS FINDINGS No intra-abdominal free air or free fluid. Hepatobiliary: No focal liver abnormality is seen. No gallstones, gallbladder wall thickening, or biliary dilatation. Pancreas: Unremarkable. No pancreatic ductal dilatation or surrounding inflammatory changes. Spleen: Normal in size without focal abnormality. Adrenals/Urinary Tract: The adrenal glands unremarkable. Nonobstructing bilateral renal calculi measure up to 7 mm in the inferior pole of the left kidney. There is no hydronephrosis on either side. The visualized ureters and urinary bladder appear unremarkable. Stomach/Bowel: There is a 3 cm duodenal diverticulum. There is sigmoid diverticulosis with muscular hypertrophy. No active inflammatory changes. There is diffuse scattered colonic diverticula. There is no bowel obstruction or active inflammation. The appendix is normal. Vascular/Lymphatic: Moderate aortoiliac atherosclerotic disease. The IVC is unremarkable. No portal venous gas. There is no adenopathy. Reproductive: The prostate and seminal vesicles are grossly unremarkable. No pelvic mass. Other: None Musculoskeletal: Multilevel degenerative changes with disc desiccation and vacuum phenomena. No acute osseous pathology. IMPRESSION: 1. No acute intrathoracic, abdominal, or pelvic pathology. 2. Nonobstructing bilateral renal calculi. No hydronephrosis. 3. Colonic diverticulosis. No bowel obstruction. Normal appendix. 4. Small bilateral pulmonary nodules measuring up to 4 mm similar to study of 2021. No follow-up needed if patient is low-risk (and has no known or suspected primary neoplasm). Non-contrast chest CT can be considered in 12 months if patient is high-risk. This recommendation follows the consensus statement: Guidelines for Management of Incidental Pulmonary Nodules Detected on CT Images: From the Fleischner Society 2017; Radiology 2017; 284:228-243. 5. Aortic  Atherosclerosis (ICD10-I70.0). Electronically Signed   By: Anner Crete M.D.   On: 10/10/2021 23:42   CT CHEST WO CONTRAST  Result Date: 10/10/2021 CLINICAL DATA:  Respiratory illness. Also flank pain. Concern for kidney stone. EXAM: CT CHEST, ABDOMEN AND PELVIS WITHOUT CONTRAST TECHNIQUE: Multidetector CT imaging of the chest, abdomen and pelvis was performed following the standard protocol without IV contrast. RADIATION DOSE REDUCTION: This exam was performed according to the departmental dose-optimization program which includes automated exposure control, adjustment of the mA and/or kV according to patient size and/or use of iterative reconstruction technique. COMPARISON:  Chest radiograph dated 10/10/2021 and CT dated 09/17/2019 and 08/08/2020. FINDINGS: Evaluation of this exam is limited in the absence of intravenous contrast. CT CHEST FINDINGS Cardiovascular: There is no cardiomegaly or pericardial effusion. Advanced 3 vessel coronary vascular calcification. Mild atherosclerotic calcification of the thoracic aorta. No aneurysmal dilatation. The central pulmonary arteries are grossly unremarkable. Mediastinum/Nodes: No hilar or mediastinal adenopathy. The esophagus is grossly unremarkable. No mediastinal fluid collection. Lungs/Pleura: Minimal scarring in the lingula. A 4 mm left lower lobe subpleural nodule (139/3) similar to study of 2021. A 4 mm nodule along the right fissure (87/3) similar to 2021. A 3 mm right middle lobe nodule (131/3) similar to 2021. No focal consolidation, pleural effusion, pneumothorax. The central airways are patent. Musculoskeletal: Degenerative changes of the spine. No acute osseous pathology. CT ABDOMEN PELVIS FINDINGS No intra-abdominal free air or free fluid. Hepatobiliary: No focal liver abnormality is seen. No gallstones, gallbladder wall thickening, or biliary dilatation. Pancreas: Unremarkable. No pancreatic ductal dilatation or surrounding inflammatory changes.  Spleen: Normal in size without focal abnormality. Adrenals/Urinary Tract: The adrenal glands unremarkable. Nonobstructing bilateral renal calculi  measure up to 7 mm in the inferior pole of the left kidney. There is no hydronephrosis on either side. The visualized ureters and urinary bladder appear unremarkable. Stomach/Bowel: There is a 3 cm duodenal diverticulum. There is sigmoid diverticulosis with muscular hypertrophy. No active inflammatory changes. There is diffuse scattered colonic diverticula. There is no bowel obstruction or active inflammation. The appendix is normal. Vascular/Lymphatic: Moderate aortoiliac atherosclerotic disease. The IVC is unremarkable. No portal venous gas. There is no adenopathy. Reproductive: The prostate and seminal vesicles are grossly unremarkable. No pelvic mass. Other: None Musculoskeletal: Multilevel degenerative changes with disc desiccation and vacuum phenomena. No acute osseous pathology. IMPRESSION: 1. No acute intrathoracic, abdominal, or pelvic pathology. 2. Nonobstructing bilateral renal calculi. No hydronephrosis. 3. Colonic diverticulosis. No bowel obstruction. Normal appendix. 4. Small bilateral pulmonary nodules measuring up to 4 mm similar to study of 2021. No follow-up needed if patient is low-risk (and has no known or suspected primary neoplasm). Non-contrast chest CT can be considered in 12 months if patient is high-risk. This recommendation follows the consensus statement: Guidelines for Management of Incidental Pulmonary Nodules Detected on CT Images: From the Fleischner Society 2017; Radiology 2017; 284:228-243. 5. Aortic Atherosclerosis (ICD10-I70.0). Electronically Signed   By: Anner Crete M.D.   On: 10/10/2021 23:42   DG Chest Port 1 View  Result Date: 10/10/2021 CLINICAL DATA:  Questionable sepsis. EXAM: PORTABLE CHEST 1 VIEW COMPARISON:  Chest radiograph dated 08/08/2020. FINDINGS: No focal consolidation, pleural effusion, or pneumothorax. Stable  cardiac silhouette. No acute osseous pathology. IMPRESSION: No active disease. Electronically Signed   By: Anner Crete M.D.   On: 10/10/2021 22:09       Subjective: Pt c/o shortness of breath    Discharge Exam: Vitals:   10/30/21 1400 10/30/21 1430  BP: 136/79 (!) 181/97  Pulse: 79 98  Resp: 14 20  Temp:    SpO2: (!) 89% (!) 86%   Vitals:   10/30/21 1100 10/30/21 1230 10/30/21 1400 10/30/21 1430  BP: 125/81 127/86 136/79 (!) 181/97  Pulse: 63 65 79 98  Resp: '18 18 14 20  '$ Temp:      TempSrc:      SpO2: 96% 96% (!) 89% (!) 86%  Weight:      Height:        General: Pt is alert, awake, not in acute distress Cardiovascular: S1/S2 +, no rubs, no gallops Respiratory: diminished breath sounds b/l  Abdominal: Soft, NT, ND, bowel sounds + Extremities: no edema, no cyanosis    The results of significant diagnostics from this hospitalization (including imaging, microbiology, ancillary and laboratory) are listed below for reference.     Microbiology: No results found for this or any previous visit (from the past 240 hour(s)).   Labs: BNP (last 3 results) Recent Labs    10/10/21 2145 10/29/21 2359  BNP 20.3 449.6*   Basic Metabolic Panel: Recent Labs  Lab 10/29/21 2359 10/30/21 0455  NA 142 138  K 4.6 5.0  CL 108 106  CO2 27 27  GLUCOSE 158* 245*  BUN 32* 34*  CREATININE 2.39* 2.14*  CALCIUM 8.6* 8.7*  MG 1.8  --    Liver Function Tests: Recent Labs  Lab 10/29/21 2359  AST 39  ALT 34  ALKPHOS 86  BILITOT 2.1*  PROT 7.9  ALBUMIN 4.5   Recent Labs  Lab 10/29/21 2359  LIPASE 45   No results for input(s): "AMMONIA" in the last 168 hours. CBC: Recent Labs  Lab 10/29/21 2359  10/30/21 0455  WBC 9.1 6.4  NEUTROABS 6.5  --   HGB 14.5 13.0  HCT 47.5 42.6  MCV 90.6 91.2  PLT 203 146*   Cardiac Enzymes: No results for input(s): "CKTOTAL", "CKMB", "CKMBINDEX", "TROPONINI" in the last 168 hours. BNP: Invalid input(s): "POCBNP" CBG: No  results for input(s): "GLUCAP" in the last 168 hours. D-Dimer No results for input(s): "DDIMER" in the last 72 hours. Hgb A1c No results for input(s): "HGBA1C" in the last 72 hours. Lipid Profile No results for input(s): "CHOL", "HDL", "LDLCALC", "TRIG", "CHOLHDL", "LDLDIRECT" in the last 72 hours. Thyroid function studies No results for input(s): "TSH", "T4TOTAL", "T3FREE", "THYROIDAB" in the last 72 hours.  Invalid input(s): "FREET3" Anemia work up No results for input(s): "VITAMINB12", "FOLATE", "FERRITIN", "TIBC", "IRON", "RETICCTPCT" in the last 72 hours. Urinalysis    Component Value Date/Time   COLORURINE YELLOW (A) 10/11/2021 0025   APPEARANCEUR HAZY (A) 10/11/2021 0025   APPEARANCEUR Clear 10/04/2017 0911   LABSPEC 1.020 10/11/2021 0025   PHURINE 5.0 10/11/2021 0025   GLUCOSEU 150 (A) 10/11/2021 0025   HGBUR NEGATIVE 10/11/2021 0025   BILIRUBINUR NEGATIVE 10/11/2021 0025   BILIRUBINUR Negative 10/04/2017 0911   KETONESUR NEGATIVE 10/11/2021 0025   PROTEINUR 30 (A) 10/11/2021 0025   NITRITE NEGATIVE 10/11/2021 0025   LEUKOCYTESUR SMALL (A) 10/11/2021 0025   Sepsis Labs Recent Labs  Lab 10/29/21 2359 10/30/21 0455  WBC 9.1 6.4   Microbiology No results found for this or any previous visit (from the past 240 hour(s)).   Time coordinating discharge: Over 30 minutes  SIGNED:   Wyvonnia Dusky, MD  [facility] 10/30/2021, 3:42 PM Pager   If 7PM-7AM, please contact night-coverage [email or web address]

## 2021-10-30 NOTE — ED Notes (Signed)
Lunch tray at bedside. ?

## 2021-10-30 NOTE — H&P (Signed)
[place]   PATIENT NAME: Theodore Obrien    MR#:  [id number]  DATE OF BIRTH:  Sep 01, 1950  DATE OF ADMISSION:  10/29/2021  PRIMARY CARE PHYSICIAN: Tracie Harrier, MD   Patient is coming from: Home  REQUESTING/REFERRING PHYSICIAN: Hinda Kehr, MD  CHIEF COMPLAINT:   Chief Complaint  Patient presents with   Respiratory Distress    HISTORY OF PRESENT ILLNESS:  Theodore Obrien is a 71 y.o. Caucasian male with medical history significant for COPD, CHF, GERD, hypertension and coronary artery disease, who presented to the ER with acute onset of worsening dyspnea with associated cough productive of clear sputum as well as wheezing that started earlier during the day.  He denies any fever or chills.  No chest pain STATIONS.  No nausea or vomiting or abdominal pain.  He has no nebulizer at home but has been using his bronchodilator inhalers without help.  No dysuria, oliguria or hematuria or flank pain.  ED Course: When he came to the ER, BP was elevated and 188/107 with tachycardia of 131 and respiratory it was 22.  Pulse oximetry was 78% on room air and initially on BiPAP 86% then was up to 97%.  Labs revealed VBG with with PCO2 of 71 and pH 7.2 with HCO3 27.7 and CMP was remarkable for a BUN of 32 and creatinine 2.39 above previous levels consistent with stage IIIb CKD.  CBC was within normal.  Blood glucose was 158. EKG as reviewed by me : Sinus tachycardia with a rate of 122 with multiple PVCs and right bundle branch block. Imaging: Portable chest ray showed COPD changes with no acute abnormalities.  The patient was given 1 L bolus of IV lactated Ringer.  He was given 125 mg of IV Solu-Medrol by EMS as well as 2 albuterol nebulizer therapy and 2 g of IV magnesium sulfate.  He will be admitted to a PCU bed for further evaluation and management. PAST MEDICAL HISTORY:   Past Medical History:  Diagnosis Date   CHF (congestive heart failure) (HCC)    COPD (chronic obstructive  pulmonary disease) (HCC)    Dyspnea    GERD (gastroesophageal reflux disease)    History of kidney stones    Hypertension    Myocardial infarction ([place]) 2020    PAST SURGICAL HISTORY:   Past Surgical History:  Procedure Laterality Date   CARPAL TUNNEL RELEASE Left    CORONARY STENT INTERVENTION N/A 12/31/2018   Procedure: CORONARY STENT INTERVENTION;  Surgeon: Yolonda Kida, MD;  Location: [facility];  Service: Cardiovascular;  Laterality: N/A;   FEMORAL HERNIA REPAIR Left 10/27/2014   Procedure: Repair of left femoral and direct hernia with mesh ;  Surgeon: Sherri Rad, MD;  Location: [facility];  Service: General;  Laterality: Left;   FRACTURE SURGERY     right leg   LEFT HEART CATH AND CORONARY ANGIOGRAPHY N/A 12/31/2018   Procedure: LEFT HEART CATH AND CORONARY ANGIOGRAPHY;  Surgeon: Corey Skains, MD;  Location: [facility];  Service: Cardiovascular;  Laterality: N/A;   XI ROBOTIC ASSISTED INGUINAL HERNIA REPAIR WITH MESH Bilateral 08/28/2019   Procedure: XI ROBOTIC ASSISTED INGUINAL HERNIA REPAIR WITH MESH;  Surgeon: Herbert Pun, MD;  Location: [facility];  Service: General;  Laterality: Bilateral;    SOCIAL HISTORY:   Social History   Tobacco Use   Smoking status: Former    Packs/day: 1.00    Years: 6.00    Total pack years:  6.00    Types: Cigarettes    Quit date: 2018    Years since quitting: 5.4   Smokeless tobacco: Never   Tobacco comments:    quit smoking 2018  Substance Use Topics   Alcohol use: Yes    Comment: occasionally beer    FAMILY HISTORY:   Family History  Problem Relation Age of Onset   Hypertension Mother    Heart disease Mother    Prostate cancer Father    Leukemia Father     DRUG ALLERGIES:   Allergies  Allergen Reactions   Zyvox [Linezolid] Other (See Comments)    Nerve damage (neuropathy) Retinal hemmorhage    REVIEW OF SYSTEMS:   ROS As per history of present illness. All pertinent systems were  reviewed above. Constitutional, HEENT, cardiovascular, respiratory, GI, GU, musculoskeletal, neuro, psychiatric, endocrine, integumentary and hematologic systems were reviewed and are otherwise negative/unremarkable except for positive findings mentioned above in the HPI.   MEDICATIONS AT HOME:   Prior to Admission medications   Medication Sig Start Date End Date Taking? Authorizing Provider  albuterol (VENTOLIN HFA) 108 (90 Base) MCG/ACT inhaler Inhale 2 puffs into the lungs every 6 (six) hours as needed for wheezing or shortness of breath.    Yes [provider]  ALPRAZolam (XANAX) 0.25 MG tablet Take 0.25 mg by mouth 2 (two) times daily as needed for anxiety or sleep.  08/03/17  Yes [provider]  amLODipine (NORVASC) 5 MG tablet Take 5 mg by mouth daily.    Yes [provider]  aspirin EC 81 MG EC tablet Take 1 tablet (81 mg total) by mouth daily. 01/02/19  Yes Ojie, Jude, MD  atorvastatin (LIPITOR) 40 MG tablet Take 40 mg by mouth daily.   Yes [provider]  carvedilol (COREG) 3.125 MG tablet Take 1 tablet (3.125 mg total) by mouth 2 (two) times daily with a meal. Hold for heart rate less than 60. 01/01/19  Yes Ojie, Jude, MD  cloNIDine (CATAPRES) 0.1 MG tablet Take 1 tablet by mouth every 8 (eight) hours. 09/24/21  Yes [provider]  clopidogrel (PLAVIX) 75 MG tablet Take 1 tablet (75 mg total) by mouth daily. 10/12/21  Yes Shawna Clamp, MD  isosorbide mononitrate (IMDUR) 30 MG 24 hr tablet Take 1 tablet (30 mg total) by mouth daily. 10/12/21  Yes Shawna Clamp, MD  pantoprazole (PROTONIX) 40 MG tablet Take 1 tablet (40 mg total) by mouth 2 (two) times daily. 10/11/21  Yes Shawna Clamp, MD      VITAL SIGNS:  Blood pressure (!) 142/84, pulse 85, temperature (!) 97.4 F (36.3 C), temperature source Axillary, resp. rate 20, height '6\' 1"'$  (1.854 m), weight 81.6 kg, SpO2 93 %.  PHYSICAL EXAMINATION:  Physical Exam  GENERAL: Acutely ill 71  y.o.-year-old patient lying in the bed with mild respiratory distress with conversational dyspnea on BiPAP.  EYES: Pupils equal, round, reactive to light and accommodation. No scleral icterus. Extraocular muscles intact.  HEENT: Head atraumatic, normocephalic. Oropharynx and nasopharynx clear.  NECK:  Supple, no jugular venous distention. No thyroid enlargement, no tenderness.  LUNGS: Diffuse expiratory wheezes with tight expiratory airflow and harsh vesicular breathing.  CARDIOVASCULAR: Regular rate and rhythm, S1, S2 normal. No murmurs, rubs, or gallops.  ABDOMEN: Soft, nondistended, nontender. Bowel sounds present. No organomegaly or mass.  EXTREMITIES: No pedal edema, cyanosis, or clubbing.  NEUROLOGIC: Cranial nerves II through XII are intact. Muscle strength 5/5 in all extremities. Sensation intact. Gait not checked.  PSYCHIATRIC: The patient is alert and oriented x 3.  Normal affect and good eye contact. SKIN: No obvious rash, lesion, or ulcer.   LABORATORY PANEL:   CBC Recent Labs  Lab 10/30/21 0455  WBC 6.4  HGB 13.0  HCT 42.6  PLT 146*   ------------------------------------------------------------------------------------------------------------------  Chemistries  Recent Labs  Lab 10/29/21 2359 10/30/21 0455  NA 142 138  K 4.6 5.0  CL 108 106  CO2 27 27  GLUCOSE 158* 245*  BUN 32* 34*  CREATININE 2.39* 2.14*  CALCIUM 8.6* 8.7*  MG 1.8  --   AST 39  --   ALT 34  --   ALKPHOS 86  --   BILITOT 2.1*  --    ------------------------------------------------------------------------------------------------------------------  Cardiac Enzymes No results for input(s): "TROPONINI" in the last 168 hours. ------------------------------------------------------------------------------------------------------------------  RADIOLOGY:  DG Chest Port 1 View  Result Date: 10/30/2021 CLINICAL DATA:  Questionable sepsis - evaluate for abnormality Shortness of breath. EXAM:  PORTABLE CHEST 1 VIEW COMPARISON:  Radiograph and CT 10/10/2021 FINDINGS: Upper normal heart size, unchanged. Aortic atherosclerosis and tortuosity. Chronic hyperinflation and bronchial thickening. No focal airspace disease, pleural effusion, or pneumothorax. Grossly stable osseous structures. IMPRESSION: 1. No acute abnormality. 2. Chronic hyperinflation and bronchial thickening, consistent with COPD. Electronically Signed   By: Keith Rake M.D.   On: 10/30/2021 00:30      IMPRESSION AND PLAN:  Assessment and Plan: * Acute respiratory failure with hypoxia and hypercapnia (HCC) - The patient will be admitted to PCU bed. - She will be continued on BiPAP and will taper off as tolerated. - We will continue steroid therapy with IV Solu-Medrol. - We will continue bronchodilator therapy with DuoNebs 4 times daily and every 4 hours as needed. - Mucolytic therapy will be provided. - We will add IV Rocephin given severity of his COPD exacerbation.  COPD with acute exacerbation ([place]) - Management as above. - This is the culprit for #1.  Acute kidney injury superimposed on chronic kidney disease ([place]) - This is post on stage IIIb chronic kidney disease. - We will follow BMP with hydration. - We will avoid nephrotoxins.  CAD (coronary artery disease) - We will continue his Coreg, Imdur and statin therapy as well as aspirin.  Hypertensive urgency - We will continue his antihypertensives. - He will be placed on as needed IV labetalol.    DVT prophylaxis: Lovenox.  Advanced Care Planning:  Code Status: full code.  Family Communication:  The plan of care was discussed in details with the patient (and family). I answered all questions. The patient agreed to proceed with the above mentioned plan. Further management will depend upon hospital course. Disposition Plan: Back to previous home environment Consults called: none.  All the records are reviewed and case discussed with ED  provider.  Status is: Inpatient  At the time of the admission, it appears that the appropriate admission status for this patient is inpatient.  This is judged to be reasonable and necessary in order to provide the required intensity of service to ensure the patient's safety given the presenting symptoms, physical exam findings and initial radiographic and laboratory data in the context of comorbid conditions.  The patient requires inpatient status due to high intensity of service, high risk of further deterioration and high frequency of surveillance required.  I certify that at the time of admission, it is my clinical judgment that the patient will require inpatient hospital care extending more than 2 midnights.  Dispo: The patient is from: Home              Anticipated d/c is to: Home              Patient currently is not medically stable to d/c.              Difficult to place patient: No  Christel Mormon M.D on 10/30/2021 at 7:18 AM  [facility]   From 7 PM-7 AM, contact night-coverage [email or web address]  CC: Primary care physician; Tracie Harrier, MD

## 2021-10-30 NOTE — ED Notes (Signed)
Breakfast tray at bedside 

## 2021-10-30 NOTE — Assessment & Plan Note (Signed)
-   We will continue his antihypertensives. - He will be placed on as needed IV labetalol. 

## 2021-10-30 NOTE — ED Notes (Signed)
Pt O2 sat 98% on Bipap. Pt tolerating well at this time

## 2021-10-30 NOTE — Assessment & Plan Note (Signed)
-   This is post on stage IIIb chronic kidney disease. - We will follow BMP with hydration. - We will avoid nephrotoxins.

## 2021-10-30 NOTE — Assessment & Plan Note (Addendum)
-   Management as above. - This is the culprit for #1.

## 2021-10-30 NOTE — Assessment & Plan Note (Signed)
-   We will continue his Coreg, Imdur and statin therapy as well as aspirin.

## 2021-10-30 NOTE — Progress Notes (Signed)
Weaned pt from Bipap to HFNC @ 10 LPM. O2 sat currently 92-93. Pt is comfortable & talkative. Will leave Bipap on s/b at bedside for now.

## 2021-10-30 NOTE — ED Notes (Addendum)
Patient refusing to stay for treatment after speaking with this RN and family. Patient aware of risk of leaving. Patient Aox4 and ambulatory upon leaving with family. Patient signed electronic AMA form. Jimmye Norman, MD aware and ED charge RN.

## 2021-10-30 NOTE — ED Notes (Signed)
Patient verbalizing wanting to leave today. Patient informed that he is currently receiving 8L O2 and informed of risk of leaving. Patient stated he will stay for a little bit longer. O2 weaned down to 6L at this time. Jimmye Norman, MD aware.

## 2021-10-30 NOTE — Assessment & Plan Note (Signed)
-   The patient will be admitted to PCU bed. - She will be continued on BiPAP and will taper off as tolerated. - We will continue steroid therapy with IV Solu-Medrol. - We will continue bronchodilator therapy with DuoNebs 4 times daily and every 4 hours as needed. - Mucolytic therapy will be provided. - We will add IV Rocephin given severity of his COPD exacerbation.

## 2021-10-31 ENCOUNTER — Inpatient Hospital Stay
Admission: EM | Admit: 2021-10-31 | Discharge: 2021-11-02 | DRG: 189 | Disposition: A | Discharge status: Home / Self Care | Payer: Medicare HMO | Attending: Internal Medicine | Admitting: Internal Medicine

## 2021-10-31 ENCOUNTER — Emergency Department: Payer: Medicare HMO

## 2021-10-31 DIAGNOSIS — J9601 Acute respiratory failure with hypoxia: Secondary | ICD-10-CM | POA: Diagnosis not present

## 2021-10-31 DIAGNOSIS — Z955 Presence of coronary angioplasty implant and graft: Secondary | ICD-10-CM

## 2021-10-31 DIAGNOSIS — J440 Chronic obstructive pulmonary disease with acute lower respiratory infection: Secondary | ICD-10-CM | POA: Diagnosis present

## 2021-10-31 DIAGNOSIS — Z87891 Personal history of nicotine dependence: Secondary | ICD-10-CM

## 2021-10-31 DIAGNOSIS — Z6821 Body mass index (BMI) 21.0-21.9, adult: Secondary | ICD-10-CM

## 2021-10-31 DIAGNOSIS — E440 Moderate protein-calorie malnutrition: Secondary | ICD-10-CM | POA: Diagnosis present

## 2021-10-31 DIAGNOSIS — K219 Gastro-esophageal reflux disease without esophagitis: Secondary | ICD-10-CM | POA: Diagnosis present

## 2021-10-31 DIAGNOSIS — Z8249 Family history of ischemic heart disease and other diseases of the circulatory system: Secondary | ICD-10-CM

## 2021-10-31 DIAGNOSIS — Z7902 Long term (current) use of antithrombotics/antiplatelets: Secondary | ICD-10-CM

## 2021-10-31 DIAGNOSIS — J9602 Acute respiratory failure with hypercapnia: Secondary | ICD-10-CM | POA: Diagnosis present

## 2021-10-31 DIAGNOSIS — Z881 Allergy status to other antibiotic agents status: Secondary | ICD-10-CM

## 2021-10-31 DIAGNOSIS — I110 Hypertensive heart disease with heart failure: Secondary | ICD-10-CM | POA: Diagnosis present

## 2021-10-31 DIAGNOSIS — I10 Essential (primary) hypertension: Secondary | ICD-10-CM | POA: Diagnosis present

## 2021-10-31 DIAGNOSIS — F1120 Opioid dependence, uncomplicated: Secondary | ICD-10-CM | POA: Diagnosis present

## 2021-10-31 DIAGNOSIS — J189 Pneumonia, unspecified organism: Secondary | ICD-10-CM | POA: Diagnosis present

## 2021-10-31 DIAGNOSIS — I5023 Acute on chronic systolic (congestive) heart failure: Secondary | ICD-10-CM | POA: Diagnosis present

## 2021-10-31 DIAGNOSIS — Z79899 Other long term (current) drug therapy: Secondary | ICD-10-CM

## 2021-10-31 DIAGNOSIS — Z7982 Long term (current) use of aspirin: Secondary | ICD-10-CM

## 2021-10-31 DIAGNOSIS — R778 Other specified abnormalities of plasma proteins: Secondary | ICD-10-CM | POA: Diagnosis present

## 2021-10-31 DIAGNOSIS — I2510 Atherosclerotic heart disease of native coronary artery without angina pectoris: Secondary | ICD-10-CM | POA: Diagnosis present

## 2021-10-31 DIAGNOSIS — I252 Old myocardial infarction: Secondary | ICD-10-CM

## 2021-10-31 DIAGNOSIS — N179 Acute kidney failure, unspecified: Secondary | ICD-10-CM | POA: Diagnosis present

## 2021-10-31 DIAGNOSIS — F1910 Other psychoactive substance abuse, uncomplicated: Secondary | ICD-10-CM | POA: Diagnosis present

## 2021-10-31 DIAGNOSIS — R451 Restlessness and agitation: Secondary | ICD-10-CM | POA: Diagnosis present

## 2021-10-31 DIAGNOSIS — J441 Chronic obstructive pulmonary disease with (acute) exacerbation: Secondary | ICD-10-CM | POA: Diagnosis present

## 2021-10-31 DIAGNOSIS — J9600 Acute respiratory failure, unspecified whether with hypoxia or hypercapnia: Secondary | ICD-10-CM | POA: Diagnosis not present

## 2021-10-31 DIAGNOSIS — I214 Non-ST elevation (NSTEMI) myocardial infarction: Secondary | ICD-10-CM

## 2021-10-31 DIAGNOSIS — E43 Unspecified severe protein-calorie malnutrition: Secondary | ICD-10-CM | POA: Diagnosis present

## 2021-10-31 DIAGNOSIS — Z20822 Contact with and (suspected) exposure to covid-19: Secondary | ICD-10-CM | POA: Diagnosis present

## 2021-10-31 LAB — BLOOD GAS, VENOUS
Acid-Base Excess: 0.4 mmol/L (ref 0.0–2.0)
Bicarbonate: 29.4 mmol/L — ABNORMAL HIGH (ref 20.0–28.0)
Delivery systems: POSITIVE
FIO2: 75 %
O2 Saturation: 97 %
Patient temperature: 37
pCO2, Ven: 67 mmHg — ABNORMAL HIGH (ref 44–60)
pH, Ven: 7.25 (ref 7.25–7.43)
pO2, Ven: 84 mmHg — ABNORMAL HIGH (ref 32–45)

## 2021-10-31 MED ORDER — LORAZEPAM 2 MG/ML IJ SOLN
2.0000 mg | Freq: Once | INTRAMUSCULAR | Status: AC
Start: 2021-10-31 — End: 2021-10-31
  Administered 2021-10-31: 2 mg via INTRAVENOUS
  Filled 2021-10-31: qty 1

## 2021-10-31 NOTE — ED Triage Notes (Signed)
Pt SOB for 6 hours. Called EMS and was at 66% when at home. 85% on CPAP. Pulled CPAP off and had to use non-rebreather. Pt pulled IV placed by EMS out

## 2021-10-31 NOTE — ED Provider Notes (Signed)
[facility] Provider Note    Event Date/Time   First MD Initiated Contact with Patient 10/31/21 2332     (approximate)   History   Respiratory Distress   Level 5 caveat:  history/ROS limited by acute/critical illness   HPI  Theodore Obrien is a 71 y.o. male with a known history of drug abuse and COPD.  Presents by EMS for severe shortness of breath.  Reportedly his room air oxygen saturation was 66% and only 85% with CPAP, but he pulled the CPAP off and ripped out his IV in route to the hospital.  He is in severe distress, using accessory muscles and gasping on a nonrebreather upon arrival.  He is reporting back pain but no chest pain.  Unable to provide additional history.     Physical Exam   ED Triage Vitals [10/31/21 2337]  Enc Vitals Group     BP (!) 184/102     Pulse Rate (!) 118     Resp (!) 31     Temp 98.4 F (36.9 C)     Temp Source Axillary     SpO2      Weight      Height      Head Circumference      Peak Flow      Pain Score      Pain Loc      Pain Edu?      Excl. in [place]?      Most recent vital signs: Vitals:   11/01/21 0430 11/01/21 0500  BP: 127/78 134/81  Pulse: 94 90  Resp:  20  Temp:    SpO2: 94% 93%     General: Awake, responds appropriately to questions, but in severe respiratory distress. CV:  Good peripheral perfusion.  Tachycardia, regular rhythm.  Normal heart sounds. Resp:  Increased respiratory effort with accessory muscle usage and intercostal retractions.  Tight breath sounds but moving good air bilaterally. Abd:  No distention.  No tenderness to palpation the abdomen. Other:  Normal mentation   ED Results / Procedures / Treatments   Labs (all labs ordered are listed, but only abnormal results are displayed) Labs Reviewed  COMPREHENSIVE METABOLIC PANEL - Abnormal; Notable for the following components:      Result Value   Glucose, Bld 168 (*)    BUN 43 (*)    Creatinine, Ser 1.76 (*)    Calcium  8.5 (*)    Total Bilirubin 2.0 (*)    GFR, Estimated 42 (*)    All other components within normal limits  BRAIN NATRIURETIC PEPTIDE - Abnormal; Notable for the following components:   B Natriuretic Peptide 1,178.1 (*)    All other components within normal limits  CBC WITH DIFFERENTIAL/PLATELET - Abnormal; Notable for the following components:   WBC 12.3 (*)    Monocytes Absolute 1.5 (*)    All other components within normal limits  BLOOD GAS, VENOUS - Abnormal; Notable for the following components:   pCO2, Ven 67 (*)    pO2, Ven 84 (*)    Bicarbonate 29.4 (*)    All other components within normal limits  TROPONIN I (HIGH SENSITIVITY) - Abnormal; Notable for the following components:   Troponin I (High Sensitivity) 399 (*)    All other components within normal limits  TROPONIN I (HIGH SENSITIVITY) - Abnormal; Notable for the following components:   Troponin I (High Sensitivity) 449 (*)    All other components within normal limits  SARS  CORONAVIRUS 2 BY RT PCR  RESPIRATORY PANEL BY PCR  EXPECTORATED SPUTUM ASSESSMENT W GRAM STAIN, RFLX TO RESP C  LIPASE, BLOOD  MAGNESIUM  ETHANOL  PROTIME-INR  APTT  URINE DRUG SCREEN, QUALITATIVE ([facility] ONLY)  HEPARIN LEVEL (UNFRACTIONATED)  HEMOGLOBIN A1C  TSH  CBC  COMPREHENSIVE METABOLIC PANEL     EKG  ED ECG REPORT I, Hinda Kehr, the attending physician, personally viewed and interpreted this ECG.  Date: 10/31/2021 EKG Time: 23: 34 Rate: 126 Rhythm: Sinus tachycardia QRS Axis: normal Intervals: Right bundle branch block and left anterior fascicular block with ventricular bigeminy ST/T Wave abnormalities: Likely some rate or exertional ischemia, but not suggestive of STEMI Narrative Interpretation: no definitive evidence of acute ischemia; does not meet STEMI criteria.    RADIOLOGY See hospital course for details: Chest x-ray shows no acute findings.    PROCEDURES:  Critical Care performed: Yes, see critical care  procedure note(s)  .1-3 Lead EKG Interpretation  Performed by: Hinda Kehr, MD Authorized by: Hinda Kehr, MD     Interpretation: abnormal     ECG rate:  120   ECG rate assessment: tachycardic     Rhythm: sinus tachycardia     Ectopy: bigeminy     Conduction: normal   .Critical Care  Performed by: Hinda Kehr, MD Authorized by: Hinda Kehr, MD   Critical care provider statement:    Critical care time (minutes):  30   Critical care time was exclusive of:  Separately billable procedures and treating other patients   Critical care was necessary to treat or prevent imminent or life-threatening deterioration of the following conditions:  Respiratory failure   Critical care was time spent personally by me on the following activities:  Development of treatment plan with patient or surrogate, evaluation of patient's response to treatment, examination of patient, obtaining history from patient or surrogate, ordering and performing treatments and interventions, ordering and review of laboratory studies, ordering and review of radiographic studies, pulse oximetry, re-evaluation of patient's condition and review of old charts    MEDICATIONS ORDERED IN ED: Medications  aspirin EC tablet 81 mg (has no administration in time range)  amLODipine (NORVASC) tablet 5 mg (has no administration in time range)  atorvastatin (LIPITOR) tablet 40 mg (has no administration in time range)  carvedilol (COREG) tablet 3.125 mg (has no administration in time range)  cloNIDine (CATAPRES) tablet 0.1 mg (has no administration in time range)  isosorbide mononitrate (IMDUR) 24 hr tablet 30 mg (has no administration in time range)  ALPRAZolam (XANAX) tablet 0.25 mg (has no administration in time range)  pantoprazole (PROTONIX) EC tablet 40 mg (has no administration in time range)  clopidogrel (PLAVIX) tablet 75 mg (has no administration in time range)  albuterol (VENTOLIN HFA) 108 (90 Base) MCG/ACT inhaler 2  puff (has no administration in time range)  cefTRIAXone (ROCEPHIN) 1 g in sodium chloride 0.9 % 100 mL IVPB (has no administration in time range)  methylPREDNISolone sodium succinate (SOLU-MEDROL) 125 mg/2 mL injection 125 mg (has no administration in time range)    Followed by  predniSONE (DELTASONE) tablet 40 mg (has no administration in time range)  ipratropium-albuterol (DUONEB) 0.5-2.5 (3) MG/3ML nebulizer solution 3 mL (has no administration in time range)  fluticasone furoate-vilanterol (BREO ELLIPTA) 100-25 MCG/ACT 1 puff (has no administration in time range)  umeclidinium bromide (INCRUSE ELLIPTA) 62.5 MCG/ACT 1 puff (has no administration in time range)  insulin aspart (novoLOG) injection 0-9 Units (has no administration in time range)  heparin injection 5,000 Units (has no administration in time range)  sodium chloride flush (NS) 0.9 % injection 3 mL (has no administration in time range)  sodium chloride flush (NS) 0.9 % injection 3 mL (has no administration in time range)  0.9 %  sodium chloride infusion (has no administration in time range)  acetaminophen (TYLENOL) tablet 650 mg (has no administration in time range)    Or  acetaminophen (TYLENOL) suppository 650 mg (has no administration in time range)  fentaNYL (SUBLIMAZE) injection 12.5-50 mcg (has no administration in time range)  polyethylene glycol (MIRALAX / GLYCOLAX) packet 17 g (has no administration in time range)  multivitamin with minerals tablet 1 tablet (has no administration in time range)  thiamine tablet 100 mg (has no administration in time range)  nicotine (NICODERM CQ - dosed in mg/24 hours) patch 21 mg (has no administration in time range)  metoprolol tartrate (LOPRESSOR) injection 5 mg (has no administration in time range)  nitroGLYCERIN (NITROSTAT) SL tablet 0.4 mg (has no administration in time range)  heparin ADULT infusion 100 units/mL (25000 units/259m) (1,050 Units/hr Intravenous New Bag/Given 11/01/21  0151)  LORazepam (ATIVAN) injection 2 mg (2 mg Intravenous Given 10/31/21 2340)  ipratropium-albuterol (DUONEB) 0.5-2.5 (3) MG/3ML nebulizer solution 3 mL (3 mLs Nebulization Given 11/01/21 0028)  ipratropium-albuterol (DUONEB) 0.5-2.5 (3) MG/3ML nebulizer solution 3 mL (3 mLs Nebulization Given 11/01/21 0028)  ipratropium-albuterol (DUONEB) 0.5-2.5 (3) MG/3ML nebulizer solution 3 mL (3 mLs Nebulization Given 11/01/21 0028)  methylPREDNISolone sodium succinate (SOLU-MEDROL) 125 mg/2 mL injection 125 mg (125 mg Intravenous Given 11/01/21 0027)  heparin bolus via infusion 4,000 Units (4,000 Units Intravenous Bolus from Bag 11/01/21 0152)     IMPRESSION / MDM / ASSESSMENT AND PLAN / ED COURSE  I reviewed the triage vital signs and the nursing notes.                              Differential diagnosis includes, but is not limited to, COPD exacerbation, pneumothorax, pneumonia, ACS, PE.  Patient's presentation is most consistent with acute presentation with potential threat to life or bodily function.  I saw this patient about 3 shifts ago with the same presentation.  He turned around quickly on BiPAP in spite of his severe respiratory distress upon arrival.  Starting BiPAP and he is tolerating.  He is more agitated this time than previous, so I ordered Ativan 2 mg IV.  Vital signs are notable for hypertension and tachycardia, likely related to his respiratory distress.  Once his respiratory status improves, if he is still hypertensive, will consider nitroglycerin, but I do not feel that this is likely to be an issue of acute pulmonary edema where immediate drop in preload is needed.  Labs/studies include:   VBG, chest x-ray, CBC with differential, COVID-19 PCR, urine drug screen, CMP, lipase, high-sensitivity troponin, BNP, ethanol level, magnesium.  Also ordered DuoNebs x3 and Solu-Medrol 125 mg IV.  He received 2 albuterol's in route to the hospital.  The patient is on the cardiac monitor to evaluate for  evidence of arrhythmia and/or significant heart rate changes.  Clinical Course as of 11/01/21 0549  Sun Oct 31, 2021  2349 pCO2, Ven(!): 67 VBG generally stable from prior with an elevated PCO2 of 67 [CF]  Mon Nov 01, 2021  0017 DG Chest Portable 1 View I viewed and interpreted the patient's chest x-ray and there are no significant changes, just chronic COPD.  I  also read the radiologist's report, which confirmed no acute findings. [CF]  0020 CBC with Differential(!) Mild leukocytosis, otherwise unremarkable [CF]  0041 Troponin I (High Sensitivity)(!!): 399 Troponin elevated at 399.  This is likely demand ischemia, but given his comorbidities and history, I am going to start heparin bolus plus infusion in case this represents an NSTEMI.  I feel that there is minimal benefit to CTA given that this is much more likely to be cardiac demand ischemia than a PE.  I reassessed him and he is sleeping comfortably on the BiPAP, moving good air, improved breath sounds.  He is somnolent as result of the Ativan, but still protecting his airway and breathing much better than before. [CF]  0042 Consulting the hospitalist for admission. [CF]  6045 Comprehensive metabolic panel(!) Comprehensive metabolic panel is stable, including his renal function. [CF]  4098 Forgot to document previously, the patient received magnesium 2 g IV in route to the hospital by EMS as well as the 2 albuterol's [CF]  0055 I consulted by phone with Dr. Kennon Rounds who is admitting tonight. [CF]  0057 B Natriuretic Peptide(!): 1,178.1 Patient's BNP is elevated at nearly 1200.  This is also a new finding.  However he has no pulmonary edema on chest x-ray and his systolic blood pressure is down to 105/61.  There is no indication to start preload reduction such as nitroglycerin nor to give furosemide.  I made Dr. Kennon Rounds aware of the result by secure chat text. [CF]    Clinical Course User Index [CF] Hinda Kehr, MD     FINAL CLINICAL  IMPRESSION(S) / ED DIAGNOSES   Final diagnoses:  Acute respiratory failure with hypoxia and hypercapnia (HCC)  COPD exacerbation (HCC)  NSTEMI (non-ST elevated myocardial infarction) ([place])     Rx / DC Orders   ED Discharge Orders     None        Note:  This document was prepared using Dragon voice recognition software and may include unintentional dictation errors.   Hinda Kehr, MD 11/01/21 [phone]

## 2021-11-01 ENCOUNTER — Encounter: Payer: Self-pay | Admitting: Family Medicine

## 2021-11-01 ENCOUNTER — Other Ambulatory Visit: Payer: Self-pay

## 2021-11-01 DIAGNOSIS — J9602 Acute respiratory failure with hypercapnia: Secondary | ICD-10-CM | POA: Diagnosis present

## 2021-11-01 DIAGNOSIS — Z881 Allergy status to other antibiotic agents status: Secondary | ICD-10-CM | POA: Diagnosis not present

## 2021-11-01 DIAGNOSIS — R778 Other specified abnormalities of plasma proteins: Secondary | ICD-10-CM | POA: Diagnosis present

## 2021-11-01 DIAGNOSIS — Z87891 Personal history of nicotine dependence: Secondary | ICD-10-CM | POA: Diagnosis not present

## 2021-11-01 DIAGNOSIS — N179 Acute kidney failure, unspecified: Secondary | ICD-10-CM | POA: Diagnosis present

## 2021-11-01 DIAGNOSIS — J441 Chronic obstructive pulmonary disease with (acute) exacerbation: Secondary | ICD-10-CM

## 2021-11-01 DIAGNOSIS — K219 Gastro-esophageal reflux disease without esophagitis: Secondary | ICD-10-CM | POA: Diagnosis present

## 2021-11-01 DIAGNOSIS — I2510 Atherosclerotic heart disease of native coronary artery without angina pectoris: Secondary | ICD-10-CM | POA: Diagnosis present

## 2021-11-01 DIAGNOSIS — I110 Hypertensive heart disease with heart failure: Secondary | ICD-10-CM | POA: Diagnosis present

## 2021-11-01 DIAGNOSIS — J189 Pneumonia, unspecified organism: Secondary | ICD-10-CM | POA: Diagnosis present

## 2021-11-01 DIAGNOSIS — J9600 Acute respiratory failure, unspecified whether with hypoxia or hypercapnia: Secondary | ICD-10-CM | POA: Diagnosis present

## 2021-11-01 DIAGNOSIS — I252 Old myocardial infarction: Secondary | ICD-10-CM | POA: Diagnosis not present

## 2021-11-01 DIAGNOSIS — Z7982 Long term (current) use of aspirin: Secondary | ICD-10-CM | POA: Diagnosis not present

## 2021-11-01 DIAGNOSIS — Z8249 Family history of ischemic heart disease and other diseases of the circulatory system: Secondary | ICD-10-CM | POA: Diagnosis not present

## 2021-11-01 DIAGNOSIS — F1120 Opioid dependence, uncomplicated: Secondary | ICD-10-CM | POA: Diagnosis present

## 2021-11-01 DIAGNOSIS — J9601 Acute respiratory failure with hypoxia: Secondary | ICD-10-CM | POA: Diagnosis present

## 2021-11-01 DIAGNOSIS — I5023 Acute on chronic systolic (congestive) heart failure: Secondary | ICD-10-CM | POA: Diagnosis present

## 2021-11-01 DIAGNOSIS — E43 Unspecified severe protein-calorie malnutrition: Secondary | ICD-10-CM | POA: Diagnosis present

## 2021-11-01 DIAGNOSIS — Z20822 Contact with and (suspected) exposure to covid-19: Secondary | ICD-10-CM | POA: Diagnosis present

## 2021-11-01 DIAGNOSIS — Z955 Presence of coronary angioplasty implant and graft: Secondary | ICD-10-CM | POA: Diagnosis not present

## 2021-11-01 DIAGNOSIS — Z7902 Long term (current) use of antithrombotics/antiplatelets: Secondary | ICD-10-CM | POA: Diagnosis not present

## 2021-11-01 DIAGNOSIS — Z6821 Body mass index (BMI) 21.0-21.9, adult: Secondary | ICD-10-CM | POA: Diagnosis not present

## 2021-11-01 DIAGNOSIS — J440 Chronic obstructive pulmonary disease with acute lower respiratory infection: Secondary | ICD-10-CM | POA: Diagnosis present

## 2021-11-01 DIAGNOSIS — Z79899 Other long term (current) drug therapy: Secondary | ICD-10-CM | POA: Diagnosis not present

## 2021-11-01 DIAGNOSIS — R451 Restlessness and agitation: Secondary | ICD-10-CM | POA: Diagnosis present

## 2021-11-01 LAB — CBC WITH DIFFERENTIAL/PLATELET
Abs Immature Granulocytes: 0.05 10*3/uL (ref 0.00–0.07)
Basophils Absolute: 0 10*3/uL (ref 0.0–0.1)
Basophils Relative: 0 %
Eosinophils Absolute: 0.2 10*3/uL (ref 0.0–0.5)
Eosinophils Relative: 2 %
HCT: 43.1 % (ref 39.0–52.0)
Hemoglobin: 13.4 g/dL (ref 13.0–17.0)
Immature Granulocytes: 0 %
Lymphocytes Relative: 27 %
Lymphs Abs: 3.3 10*3/uL (ref 0.7–4.0)
MCH: 28 pg (ref 26.0–34.0)
MCHC: 31.1 g/dL (ref 30.0–36.0)
MCV: 90 fL (ref 80.0–100.0)
Monocytes Absolute: 1.5 10*3/uL — ABNORMAL HIGH (ref 0.1–1.0)
Monocytes Relative: 12 %
Neutro Abs: 7.3 10*3/uL (ref 1.7–7.7)
Neutrophils Relative %: 59 %
Platelets: 192 10*3/uL (ref 150–400)
RBC: 4.79 MIL/uL (ref 4.22–5.81)
RDW: 13.4 % (ref 11.5–15.5)
WBC: 12.3 10*3/uL — ABNORMAL HIGH (ref 4.0–10.5)
nRBC: 0 % (ref 0.0–0.2)

## 2021-11-01 LAB — COMPREHENSIVE METABOLIC PANEL
ALT: 42 U/L (ref 0–44)
AST: 36 U/L (ref 15–41)
Albumin: 4 g/dL (ref 3.5–5.0)
Alkaline Phosphatase: 116 U/L (ref 38–126)
Anion gap: 10 (ref 5–15)
BUN: 43 mg/dL — ABNORMAL HIGH (ref 8–23)
CO2: 25 mmol/L (ref 22–32)
Calcium: 8.5 mg/dL — ABNORMAL LOW (ref 8.9–10.3)
Chloride: 102 mmol/L (ref 98–111)
Creatinine, Ser: 1.76 mg/dL — ABNORMAL HIGH (ref 0.61–1.24)
GFR, Estimated: 42 mL/min — ABNORMAL LOW (ref >60)
Glucose, Bld: 168 mg/dL — ABNORMAL HIGH (ref 70–99)
Potassium: 4.2 mmol/L (ref 3.5–5.1)
Sodium: 137 mmol/L (ref 135–145)
Total Bilirubin: 2 mg/dL — ABNORMAL HIGH (ref 0.3–1.2)
Total Protein: 7.1 g/dL (ref 6.5–8.1)

## 2021-11-01 LAB — RESPIRATORY PANEL BY PCR

## 2021-11-01 LAB — CBC
HCT: 39.3 % (ref 39.0–52.0)
Hemoglobin: 12.3 g/dL — ABNORMAL LOW (ref 13.0–17.0)
MCH: 27.6 pg (ref 26.0–34.0)
MCHC: 31.3 g/dL (ref 30.0–36.0)
MCV: 88.1 fL (ref 80.0–100.0)
Platelets: 126 K/uL — ABNORMAL LOW (ref 150–400)
RBC: 4.46 MIL/uL (ref 4.22–5.81)
RDW: 13.4 % (ref 11.5–15.5)
WBC: 3.4 K/uL — ABNORMAL LOW (ref 4.0–10.5)
nRBC: 0 % (ref 0.0–0.2)

## 2021-11-01 LAB — TROPONIN I (HIGH SENSITIVITY)
Troponin I (High Sensitivity): 165 ng/L (ref <18)
Troponin I (High Sensitivity): 399 ng/L (ref <18)
Troponin I (High Sensitivity): 449 ng/L (ref <18)

## 2021-11-01 LAB — COMPREHENSIVE METABOLIC PANEL WITH GFR
ALT: 40 U/L (ref 0–44)
AST: 26 U/L (ref 15–41)
Albumin: 3.3 g/dL — ABNORMAL LOW (ref 3.5–5.0)
Alkaline Phosphatase: 102 U/L (ref 38–126)
Anion gap: 5 (ref 5–15)
BUN: 40 mg/dL — ABNORMAL HIGH (ref 8–23)
CO2: 25 mmol/L (ref 22–32)
Calcium: 8.9 mg/dL (ref 8.9–10.3)
Chloride: 106 mmol/L (ref 98–111)
Creatinine, Ser: 1.52 mg/dL — ABNORMAL HIGH (ref 0.61–1.24)
GFR, Estimated: 50 mL/min — ABNORMAL LOW
Glucose, Bld: 238 mg/dL — ABNORMAL HIGH (ref 70–99)
Potassium: 4.2 mmol/L (ref 3.5–5.1)
Sodium: 136 mmol/L (ref 135–145)
Total Bilirubin: 1.7 mg/dL — ABNORMAL HIGH (ref 0.3–1.2)
Total Protein: 6.6 g/dL (ref 6.5–8.1)

## 2021-11-01 LAB — URINE DRUG SCREEN, QUALITATIVE (ARMC ONLY)
Amphetamines, Ur Screen: NOT DETECTED
Barbiturates, Ur Screen: NOT DETECTED
Benzodiazepine, Ur Scrn: NOT DETECTED
Cannabinoid 50 Ng, Ur ~~LOC~~: POSITIVE — AB
Cocaine Metabolite,Ur ~~LOC~~: POSITIVE — AB
MDMA (Ecstasy)Ur Screen: NOT DETECTED
Methadone Scn, Ur: NOT DETECTED
Opiate, Ur Screen: NOT DETECTED
Phencyclidine (PCP) Ur S: NOT DETECTED
Tricyclic, Ur Screen: NOT DETECTED

## 2021-11-01 LAB — T4, FREE: Free T4: 1.33 ng/dL — ABNORMAL HIGH (ref 0.61–1.12)

## 2021-11-01 LAB — CBG MONITORING, ED
Glucose-Capillary: 236 mg/dL — ABNORMAL HIGH (ref 70–99)
Glucose-Capillary: 222 mg/dL — ABNORMAL HIGH (ref 70–99)

## 2021-11-01 LAB — HEPARIN LEVEL (UNFRACTIONATED)
Heparin Unfractionated: 0.12 [IU]/mL — ABNORMAL LOW (ref 0.30–0.70)
Heparin Unfractionated: 0.17 IU/mL — ABNORMAL LOW (ref 0.30–0.70)

## 2021-11-01 LAB — BRAIN NATRIURETIC PEPTIDE: B Natriuretic Peptide: 1178.1 pg/mL — ABNORMAL HIGH (ref 0.0–100.0)

## 2021-11-01 LAB — PROCALCITONIN: Procalcitonin: 0.56 ng/mL

## 2021-11-01 LAB — LIPASE, BLOOD: Lipase: 32 U/L (ref 11–51)

## 2021-11-01 LAB — EXPECTORATED SPUTUM ASSESSMENT W GRAM STAIN, RFLX TO RESP C

## 2021-11-01 LAB — PROTIME-INR
INR: 1.1 (ref 0.8–1.2)
Prothrombin Time: 14.3 seconds (ref 11.4–15.2)

## 2021-11-01 LAB — TSH: TSH: 0.164 u[IU]/mL — ABNORMAL LOW (ref 0.350–4.500)

## 2021-11-01 LAB — MAGNESIUM: Magnesium: 1.9 mg/dL (ref 1.7–2.4)

## 2021-11-01 LAB — GLUCOSE, CAPILLARY: Glucose-Capillary: 198 mg/dL — ABNORMAL HIGH (ref 70–99)

## 2021-11-01 LAB — SARS CORONAVIRUS 2 BY RT PCR: SARS Coronavirus 2 by RT PCR: NEGATIVE

## 2021-11-01 LAB — APTT: aPTT: 32 seconds (ref 24–36)

## 2021-11-01 LAB — ETHANOL: Alcohol, Ethyl (B): <10 mg/dL (ref <10)

## 2021-11-01 MED ORDER — INSULIN ASPART 100 UNIT/ML IJ SOLN
0.0000 [IU] | Freq: Three times a day (TID) | INTRAMUSCULAR | Status: DC
  Administered 2021-11-01 (×2): 3 [IU] via SUBCUTANEOUS
  Administered 2021-11-02: 2 [IU] via SUBCUTANEOUS
  Administered 2021-11-02: 3 [IU] via SUBCUTANEOUS
  Filled 2021-11-01 (×4): qty 1

## 2021-11-01 MED ORDER — IPRATROPIUM-ALBUTEROL 0.5-2.5 (3) MG/3ML IN SOLN
3.0000 mL | Freq: Two times a day (BID) | RESPIRATORY_TRACT | Status: DC
  Administered 2021-11-02: 3 mL via RESPIRATORY_TRACT
  Filled 2021-11-01: qty 3

## 2021-11-01 MED ORDER — NICOTINE 21 MG/24HR TD PT24
21.0000 mg | MEDICATED_PATCH | Freq: Every day | TRANSDERMAL | Status: DC
Start: 2021-11-01 — End: 2021-11-02
  Filled 2021-11-01: qty 1

## 2021-11-01 MED ORDER — HEPARIN SODIUM (PORCINE) 5000 UNIT/ML IJ SOLN: 5000.0000 [IU] | Freq: Three times a day (TID) | INTRAMUSCULAR | Status: DC

## 2021-11-01 MED ORDER — SODIUM CHLORIDE 0.9% FLUSH
3.0000 mL | Freq: Two times a day (BID) | INTRAVENOUS | Status: DC
Start: 2021-11-01 — End: 2021-11-02
  Administered 2021-11-01 – 2021-11-02 (×3): 3 mL via INTRAVENOUS

## 2021-11-01 MED ORDER — SODIUM CHLORIDE 0.9 % IV SOLN
1.0000 g | INTRAVENOUS | Status: DC
  Administered 2021-11-01 – 2021-11-02 (×2): 1 g via INTRAVENOUS
  Filled 2021-11-01: qty 10
  Filled 2021-11-01: qty 1

## 2021-11-01 MED ORDER — IPRATROPIUM-ALBUTEROL 0.5-2.5 (3) MG/3ML IN SOLN
3.0000 mL | Freq: Once | RESPIRATORY_TRACT | Status: AC
  Administered 2021-11-01: 3 mL via RESPIRATORY_TRACT
  Filled 2021-11-01: qty 3

## 2021-11-01 MED ORDER — IPRATROPIUM-ALBUTEROL 0.5-2.5 (3) MG/3ML IN SOLN
3.0000 mL | Freq: Four times a day (QID) | RESPIRATORY_TRACT | Status: DC
  Administered 2021-11-01 (×3): 3 mL via RESPIRATORY_TRACT
  Filled 2021-11-01 (×3): qty 3

## 2021-11-01 MED ORDER — CARVEDILOL 3.125 MG PO TABS
3.1250 mg | ORAL_TABLET | Freq: Two times a day (BID) | ORAL | Status: DC
  Administered 2021-11-01 – 2021-11-02 (×4): 3.125 mg via ORAL
  Filled 2021-11-01 (×4): qty 1

## 2021-11-01 MED ORDER — HEPARIN (PORCINE) 25000 UT/250ML-% IV SOLN
1600.0000 [IU]/h | INTRAVENOUS | Status: DC
Start: 2021-11-01 — End: 2021-11-02
  Administered 2021-11-01: 1050 [IU]/h via INTRAVENOUS
  Administered 2021-11-02: 1600 [IU]/h via INTRAVENOUS
  Filled 2021-11-01 (×3): qty 250

## 2021-11-01 MED ORDER — METHYLPREDNISOLONE SODIUM SUCC 125 MG IJ SOLR
125.0000 mg | Freq: Two times a day (BID) | INTRAMUSCULAR | Status: AC
  Administered 2021-11-01: 125 mg via INTRAVENOUS
  Filled 2021-11-01 (×2): qty 2

## 2021-11-01 MED ORDER — AMLODIPINE BESYLATE 5 MG PO TABS
5.0000 mg | ORAL_TABLET | Freq: Every day | ORAL | Status: DC
  Administered 2021-11-01 – 2021-11-02 (×2): 5 mg via ORAL
  Filled 2021-11-01 (×2): qty 1

## 2021-11-01 MED ORDER — ISOSORBIDE MONONITRATE ER 30 MG PO TB24
30.0000 mg | ORAL_TABLET | Freq: Every day | ORAL | Status: DC
  Administered 2021-11-01 – 2021-11-02 (×2): 30 mg via ORAL
  Filled 2021-11-01 (×2): qty 1

## 2021-11-01 MED ORDER — ACETAMINOPHEN 325 MG PO TABS: 650.0000 mg | ORAL_TABLET | Freq: Four times a day (QID) | ORAL | Status: DC | PRN

## 2021-11-01 MED ORDER — AZITHROMYCIN 250 MG PO TABS
250.0000 mg | ORAL_TABLET | Freq: Every day | ORAL | Status: DC
  Administered 2021-11-02: 250 mg via ORAL
  Filled 2021-11-01: qty 1

## 2021-11-01 MED ORDER — SODIUM CHLORIDE 0.9% FLUSH: 3.0000 mL | INTRAVENOUS | Status: DC | PRN

## 2021-11-01 MED ORDER — ALBUTEROL SULFATE HFA 108 (90 BASE) MCG/ACT IN AERS: 2.0000 | INHALATION_SPRAY | Freq: Four times a day (QID) | RESPIRATORY_TRACT | Status: DC | PRN

## 2021-11-01 MED ORDER — ACETAMINOPHEN 650 MG RE SUPP: 650.0000 mg | Freq: Four times a day (QID) | RECTAL | Status: DC | PRN

## 2021-11-01 MED ORDER — CLONIDINE HCL 0.1 MG PO TABS
0.1000 mg | ORAL_TABLET | Freq: Three times a day (TID) | ORAL | Status: DC
  Administered 2021-11-01 – 2021-11-02 (×5): 0.1 mg via ORAL
  Filled 2021-11-01 (×5): qty 1

## 2021-11-01 MED ORDER — ALBUTEROL SULFATE (2.5 MG/3ML) 0.083% IN NEBU: 2.5000 mg | INHALATION_SOLUTION | Freq: Four times a day (QID) | RESPIRATORY_TRACT | Status: DC | PRN

## 2021-11-01 MED ORDER — METOPROLOL TARTRATE 5 MG/5ML IV SOLN: 5.0000 mg | Freq: Four times a day (QID) | INTRAVENOUS | Status: DC | PRN

## 2021-11-01 MED ORDER — FLUTICASONE FUROATE-VILANTEROL 100-25 MCG/ACT IN AEPB
1.0000 | INHALATION_SPRAY | Freq: Every day | RESPIRATORY_TRACT | Status: DC
  Administered 2021-11-02: 1 via RESPIRATORY_TRACT
  Filled 2021-11-01: qty 28

## 2021-11-01 MED ORDER — ASPIRIN 81 MG PO TBEC
81.0000 mg | DELAYED_RELEASE_TABLET | Freq: Every day | ORAL | Status: DC
  Administered 2021-11-01 – 2021-11-02 (×2): 81 mg via ORAL
  Filled 2021-11-01 (×2): qty 1

## 2021-11-01 MED ORDER — PREDNISONE 20 MG PO TABS
40.0000 mg | ORAL_TABLET | Freq: Every day | ORAL | Status: DC
  Administered 2021-11-02: 40 mg via ORAL
  Filled 2021-11-01: qty 2

## 2021-11-01 MED ORDER — SODIUM CHLORIDE 0.9 % IV SOLN: 250.0000 mL | INTRAVENOUS | Status: DC | PRN

## 2021-11-01 MED ORDER — FUROSEMIDE 10 MG/ML IJ SOLN
40.0000 mg | Freq: Two times a day (BID) | INTRAMUSCULAR | Status: DC
Start: 2021-11-01 — End: 2021-11-02
  Administered 2021-11-01 – 2021-11-02 (×3): 40 mg via INTRAVENOUS
  Filled 2021-11-01 (×4): qty 4

## 2021-11-01 MED ORDER — FENTANYL CITRATE PF 50 MCG/ML IJ SOSY
12.5000 ug | PREFILLED_SYRINGE | INTRAMUSCULAR | Status: DC | PRN
  Administered 2021-11-02: 12.5 ug via INTRAVENOUS
  Administered 2021-11-02: 25 ug via INTRAVENOUS
  Administered 2021-11-02 (×2): 50 ug via INTRAVENOUS
  Administered 2021-11-02: 12.5 ug via INTRAVENOUS
  Filled 2021-11-01 (×5): qty 1

## 2021-11-01 MED ORDER — PANTOPRAZOLE SODIUM 40 MG PO TBEC
40.0000 mg | DELAYED_RELEASE_TABLET | Freq: Two times a day (BID) | ORAL | Status: DC
  Administered 2021-11-01 – 2021-11-02 (×3): 40 mg via ORAL
  Filled 2021-11-01 (×3): qty 1

## 2021-11-01 MED ORDER — ACETAMINOPHEN 325 MG RE SUPP
650.0000 mg | Freq: Four times a day (QID) | RECTAL | Status: DC | PRN
Start: 2021-11-01 — End: 2021-11-01

## 2021-11-01 MED ORDER — HEPARIN BOLUS VIA INFUSION
2500.0000 [IU] | Freq: Once | INTRAVENOUS | Status: AC
Start: 2021-11-01 — End: 2021-11-01
  Administered 2021-11-01: 2500 [IU] via INTRAVENOUS
  Filled 2021-11-01: qty 2500

## 2021-11-01 MED ORDER — POLYETHYLENE GLYCOL 3350 17 G PO PACK
17.0000 g | PACK | Freq: Every day | ORAL | Status: DC | PRN
Start: 2021-11-01 — End: 2021-11-02

## 2021-11-01 MED ORDER — METHYLPREDNISOLONE SODIUM SUCC 125 MG IJ SOLR
125.0000 mg | Freq: Once | INTRAMUSCULAR | Status: AC
Start: 2021-11-01 — End: 2021-11-01
  Administered 2021-11-01: 125 mg via INTRAVENOUS
  Filled 2021-11-01: qty 2

## 2021-11-01 MED ORDER — NITROGLYCERIN 0.4 MG SL SUBL: 0.4000 mg | SUBLINGUAL_TABLET | SUBLINGUAL | Status: DC | PRN

## 2021-11-01 MED ORDER — ATORVASTATIN CALCIUM 20 MG PO TABS
40.0000 mg | ORAL_TABLET | Freq: Every day | ORAL | Status: DC
  Administered 2021-11-01 – 2021-11-02 (×2): 40 mg via ORAL
  Filled 2021-11-01 (×2): qty 2

## 2021-11-01 MED ORDER — ADULT MULTIVITAMIN W/MINERALS CH
1.0000 | ORAL_TABLET | Freq: Every day | ORAL | Status: DC
  Administered 2021-11-01 – 2021-11-02 (×2): 1 via ORAL
  Filled 2021-11-01 (×2): qty 1

## 2021-11-01 MED ORDER — CLOPIDOGREL BISULFATE 75 MG PO TABS
75.0000 mg | ORAL_TABLET | Freq: Every day | ORAL | Status: DC
  Administered 2021-11-01 – 2021-11-02 (×2): 75 mg via ORAL
  Filled 2021-11-01 (×2): qty 1

## 2021-11-01 MED ORDER — HEPARIN BOLUS VIA INFUSION
2500.0000 [IU] | Freq: Once | INTRAVENOUS | Status: AC
  Administered 2021-11-01: 2500 [IU] via INTRAVENOUS
  Filled 2021-11-01: qty 2500

## 2021-11-01 MED ORDER — AZITHROMYCIN 500 MG PO TABS
500.0000 mg | ORAL_TABLET | Freq: Every day | ORAL | Status: AC
  Administered 2021-11-01: 500 mg via ORAL
  Filled 2021-11-01: qty 1

## 2021-11-01 MED ORDER — UMECLIDINIUM BROMIDE 62.5 MCG/ACT IN AEPB
1.0000 | INHALATION_SPRAY | Freq: Every day | RESPIRATORY_TRACT | Status: DC
Start: 2021-11-02 — End: 2021-11-02
  Administered 2021-11-02: 1 via RESPIRATORY_TRACT
  Filled 2021-11-01: qty 7

## 2021-11-01 MED ORDER — HEPARIN BOLUS VIA INFUSION
4000.0000 [IU] | Freq: Once | INTRAVENOUS | Status: AC
  Administered 2021-11-01: 4000 [IU] via INTRAVENOUS
  Filled 2021-11-01: qty 4000

## 2021-11-01 MED ORDER — THIAMINE HCL 100 MG PO TABS
100.0000 mg | ORAL_TABLET | Freq: Every day | ORAL | Status: DC
  Administered 2021-11-01 – 2021-11-02 (×2): 100 mg via ORAL
  Filled 2021-11-01 (×2): qty 1

## 2021-11-01 MED ORDER — ALPRAZOLAM 0.25 MG PO TABS
0.2500 mg | ORAL_TABLET | Freq: Two times a day (BID) | ORAL | Status: DC | PRN
  Administered 2021-11-01: 0.25 mg via ORAL
  Filled 2021-11-01: qty 1

## 2021-11-01 NOTE — H&P (Signed)
History and Physical    Patient: Theodore Obrien OJJ:[id number] DOB: 04-29-1951 DOA: 10/31/2021 DOS: the patient was seen and examined on 11/01/2021 PCP: Tracie Harrier, MD  Patient coming from: Home  Chief Complaint:  Chief Complaint  Patient presents with   Respiratory Distress   HPI: Theodore Obrien is a 71 y.o. male with medical history significant of CHF, HTN, CAD, GERD, substance use, tobacco use who was previously seen on 80 and admitted with the exact same complaints.  He came in at acutely short of breath with sats in the 50s to 60s that improved on BiPAP.  Apparently he stayed overnight and left the next day [place].  He said he had things to do this holiday weekend.  He called EMS today feeling acutely short of breath as well and when EMS arrived he was noted to have low oxygen saturations in the 60s.  He was placed on CPAP but then removed this and his IV during the trip with EMS. In the ED he was placed on BiPAP and his sats improved to the mid 90s.  He is noted to have acute renal insufficiency with a creatinine of 1.76 which is improved from previous.  He had a bump in his troponin at 399 as well as an elevated BNP.  Clinically he does not look fluid overloaded but does have a recent echo that showed his EF to be 45 to 50%.  Patient has a history of coronary artery disease and is status post cath with stent placement in 2020 and remains on aspirin and Plavix.  Review of Systems: unable to review all systems due to the inability of the patient to answer questions. Past Medical History:  Diagnosis Date   CHF (congestive heart failure) (HCC)    COPD (chronic obstructive pulmonary disease) (HCC)    Dyspnea    GERD (gastroesophageal reflux disease)    History of kidney stones    Hypertension    Myocardial infarction ([place]) 2020   Past Surgical History:  Procedure Laterality Date   CARPAL TUNNEL RELEASE Left    CORONARY STENT INTERVENTION N/A 12/31/2018   Procedure:  CORONARY STENT INTERVENTION;  Surgeon: Yolonda Kida, MD;  Location: [facility];  Service: Cardiovascular;  Laterality: N/A;   FEMORAL HERNIA REPAIR Left 10/27/2014   Procedure: Repair of left femoral and direct hernia with mesh ;  Surgeon: Sherri Rad, MD;  Location: [facility];  Service: General;  Laterality: Left;   FRACTURE SURGERY     right leg   LEFT HEART CATH AND CORONARY ANGIOGRAPHY N/A 12/31/2018   Procedure: LEFT HEART CATH AND CORONARY ANGIOGRAPHY;  Surgeon: Corey Skains, MD;  Location: [facility];  Service: Cardiovascular;  Laterality: N/A;   XI ROBOTIC ASSISTED INGUINAL HERNIA REPAIR WITH MESH Bilateral 08/28/2019   Procedure: XI ROBOTIC ASSISTED INGUINAL HERNIA REPAIR WITH MESH;  Surgeon: Herbert Pun, MD;  Location: [facility];  Service: General;  Laterality: Bilateral;   Social History:  reports that he quit smoking about 5 years ago. His smoking use included cigarettes. He has a 6.00 pack-year smoking history. He has never used smokeless tobacco. He reports current alcohol use. He reports current drug use. Drug: IV.  Allergies  Allergen Reactions   Zyvox [Linezolid] Other (See Comments)    Nerve damage (neuropathy) Retinal hemmorhage    Family History  Problem Relation Age of Onset   Hypertension Mother    Heart disease Mother    Prostate cancer Father  Leukemia Father     Prior to Admission medications   Medication Sig Start Date End Date Taking? Authorizing Provider  albuterol (VENTOLIN HFA) 108 (90 Base) MCG/ACT inhaler Inhale 2 puffs into the lungs every 6 (six) hours as needed for wheezing or shortness of breath.    Yes [provider]  ALPRAZolam (XANAX) 0.25 MG tablet Take 0.25 mg by mouth 2 (two) times daily as needed for anxiety or sleep.  08/03/17  Yes [provider]  amLODipine (NORVASC) 5 MG tablet Take 5 mg by mouth daily.    Yes [provider]  aspirin EC 81 MG EC tablet Take 1 tablet (81 mg  total) by mouth daily. 01/02/19  Yes Ojie, Jude, MD  atorvastatin (LIPITOR) 40 MG tablet Take 40 mg by mouth daily.   Yes [provider]  carvedilol (COREG) 3.125 MG tablet Take 1 tablet (3.125 mg total) by mouth 2 (two) times daily with a meal. Hold for heart rate less than 60. 01/01/19  Yes Ojie, Jude, MD  cloNIDine (CATAPRES) 0.1 MG tablet Take 1 tablet by mouth every 8 (eight) hours. 09/24/21  Yes [provider]  clopidogrel (PLAVIX) 75 MG tablet Take 1 tablet (75 mg total) by mouth daily. 10/12/21  Yes Shawna Clamp, MD  isosorbide mononitrate (IMDUR) 30 MG 24 hr tablet Take 1 tablet (30 mg total) by mouth daily. 10/12/21  Yes Shawna Clamp, MD  pantoprazole (PROTONIX) 40 MG tablet Take 1 tablet (40 mg total) by mouth 2 (two) times daily. 10/11/21  Yes Shawna Clamp, MD    Physical Exam: Vitals:   10/31/21 2340 10/31/21 2358 11/01/21 0000 11/01/21 0030  BP:   97/69 105/61  Pulse: (!) 116 (!) 107 (!) 107 97  Resp: (!) 28 20 (!) 24 17  Temp:      TempSrc:      SpO2: 100% 95% 94% 93%   Physical Examination: General appearance - in mild to moderate distress and chronically ill appearing and unkempt Mental status -patient is quite somnolent and unable to participate in his care. Chest -moving good air.  There is accessory muscle use noted Heart -tachycardia with regular rhythm Abdomen -soft, distended unable to determine any tenderness. Neurological -patient is quite somnolent and did not really rouse for me to get a good impression of his neuro function. Extremities -enlarged veins noted on his arms Skin - normal coloration and turgor, no rashes, no suspicious skin lesions noted  Data Reviewed: Results for orders placed or performed during the hospital encounter of 10/31/21 (from the past 24 hour(s))  Blood gas, venous     Status: Abnormal   Collection Time: 10/31/21 11:34 PM  Result Value Ref Range   FIO2 75 %   Delivery systems BILEVEL POSITIVE AIRWAY PRESSURE     pH, Ven 7.25 7.25 - 7.43   pCO2, Ven 67 (H) 44 - 60 mmHg   pO2, Ven 84 (H) 32 - 45 mmHg   Bicarbonate 29.4 (H) 20.0 - 28.0 mmol/L   Acid-Base Excess 0.4 0.0 - 2.0 mmol/L   O2 Saturation 97 %   Patient temperature 37.0    Collection site VEIN   Comprehensive metabolic panel     Status: Abnormal   Collection Time: 10/31/21 11:42 PM  Result Value Ref Range   Sodium 137 135 - 145 mmol/L   Potassium 4.2 3.5 - 5.1 mmol/L   Chloride 102 98 - 111 mmol/L   CO2 25 22 - 32 mmol/L   Glucose, Bld 168 (  H) 70 - 99 mg/dL   BUN 43 (H) 8 - 23 mg/dL   Creatinine, Ser 1.76 (H) 0.61 - 1.24 mg/dL   Calcium 8.5 (L) 8.9 - 10.3 mg/dL   Total Protein 7.1 6.5 - 8.1 g/dL   Albumin 4.0 3.5 - 5.0 g/dL   AST 36 15 - 41 U/L   ALT 42 0 - 44 U/L   Alkaline Phosphatase 116 38 - 126 U/L   Total Bilirubin 2.0 (H) 0.3 - 1.2 mg/dL   GFR, Estimated 42 (L) >60 mL/min   Anion gap 10 5 - 15  Brain natriuretic peptide     Status: Abnormal   Collection Time: 10/31/21 11:42 PM  Result Value Ref Range   B Natriuretic Peptide 1,178.1 (H) 0.0 - 100.0 pg/mL  Lipase, blood     Status: None   Collection Time: 10/31/21 11:42 PM  Result Value Ref Range   Lipase 32 11 - 51 U/L  CBC with Differential     Status: Abnormal   Collection Time: 10/31/21 11:42 PM  Result Value Ref Range   WBC 12.3 (H) 4.0 - 10.5 K/uL   RBC 4.79 4.22 - 5.81 MIL/uL   Hemoglobin 13.4 13.0 - 17.0 g/dL   HCT 43.1 39.0 - 52.0 %   MCV 90.0 80.0 - 100.0 fL   MCH 28.0 26.0 - 34.0 pg   MCHC 31.1 30.0 - 36.0 g/dL   RDW 13.4 11.5 - 15.5 %   Platelets 192 150 - 400 K/uL   nRBC 0.0 0.0 - 0.2 %   Neutrophils Relative % 59 %   Neutro Abs 7.3 1.7 - 7.7 K/uL   Lymphocytes Relative 27 %   Lymphs Abs 3.3 0.7 - 4.0 K/uL   Monocytes Relative 12 %   Monocytes Absolute 1.5 (H) 0.1 - 1.0 K/uL   Eosinophils Relative 2 %   Eosinophils Absolute 0.2 0.0 - 0.5 K/uL   Basophils Relative 0 %   Basophils Absolute 0.0 0.0 - 0.1 K/uL   Immature Granulocytes 0 %   Abs  Immature Granulocytes 0.05 0.00 - 0.07 K/uL  Troponin I (High Sensitivity)     Status: Abnormal   Collection Time: 10/31/21 11:42 PM  Result Value Ref Range   Troponin I (High Sensitivity) 399 (HH) <18 ng/L  Magnesium     Status: None   Collection Time: 10/31/21 11:42 PM  Result Value Ref Range   Magnesium 1.9 1.7 - 2.4 mg/dL  Ethanol     Status: None   Collection Time: 10/31/21 11:42 PM  Result Value Ref Range   Alcohol, Ethyl (B) <10 <10 mg/dL   DG Chest Portable 1 View  Result Date: 11/01/2021 CLINICAL DATA:  Acute dyspnea EXAM: PORTABLE CHEST 1 VIEW COMPARISON:  10/30/2021 FINDINGS: Cardiac shadow is prominent but stable. Aortic calcifications are again seen. Lungs are well aerated without focal infiltrate. Chronic bronchial thickening is again noted similar to that seen on the previous day. No new focal abnormality is noted. IMPRESSION: No change from the previous day.  No acute abnormality seen. Electronically Signed   By: Inez Catalina M.D.   On: 11/01/2021 00:08   DG Chest Port 1 View  Result Date: 10/30/2021 CLINICAL DATA:  Questionable sepsis - evaluate for abnormality Shortness of breath. EXAM: PORTABLE CHEST 1 VIEW COMPARISON:  Radiograph and CT 10/10/2021 FINDINGS: Upper normal heart size, unchanged. Aortic atherosclerosis and tortuosity. Chronic hyperinflation and bronchial thickening. No focal airspace disease, pleural effusion, or pneumothorax. Grossly stable osseous structures. IMPRESSION:  1. No acute abnormality. 2. Chronic hyperinflation and bronchial thickening, consistent with COPD. Electronically Signed   By: Keith Rake M.D.   On: 10/30/2021 00:30   ECHOCARDIOGRAM COMPLETE  Result Date: 10/11/2021    ECHOCARDIOGRAM REPORT   Patient Name:   Theodore Obrien Date of Exam: 10/11/2021 Medical Rec #:  [id number]     Height:       73.0 in Accession #:    [id number]    Weight:       180.0 lb Date of Birth:  May 13, 1950      BSA:          2.057 m Patient Age:    43 years      BP:            110/69 mmHg Patient Gender: M             HR:           60 bpm. Exam Location:  [facility] Procedure: 2D Echo, Cardiac Doppler and Color Doppler Indications:     Shortness of breath  History:         Patient has prior history of Echocardiogram examinations. CHF,                  CAD and Previous Myocardial Infarction, Cardiac stent, COPD;                  Risk Factors:Former Smoker, Hypertension and Dyslipidemia.  Sonographer:     Rosalia Hammers Referring Phys:  [place] Diagnosing Phys: Neoma Laming  Sonographer Comments: Suboptimal apical window. Image acquisition challenging due to respiratory motion. IMPRESSIONS  1. Left ventricular ejection fraction, by estimation, is 45 to 50%. The left ventricle has mildly decreased function. The left ventricle demonstrates global hypokinesis. The left ventricular internal cavity size was mildly dilated. Left ventricular diastolic parameters are indeterminate.  2. Right ventricular systolic function is normal. The right ventricular size is normal.  3. Left atrial size was mildly dilated.  4. Right atrial size was mildly dilated.  5. The mitral valve is normal in structure. Trivial mitral valve regurgitation. No evidence of mitral stenosis.  6. The aortic valve is normal in structure. Aortic valve regurgitation is not visualized. No aortic stenosis is present.  7. The inferior vena cava is normal in size with greater than 50% respiratory variability, suggesting right atrial pressure of 3 mmHg. FINDINGS  Left Ventricle: Left ventricular ejection fraction, by estimation, is 45 to 50%. The left ventricle has mildly decreased function. The left ventricle demonstrates global hypokinesis. The left ventricular internal cavity size was mildly dilated. There is  no left ventricular hypertrophy. Left ventricular diastolic parameters are indeterminate. Right Ventricle: The right ventricular size is normal. No increase in right ventricular wall thickness. Right ventricular  systolic function is normal. Left Atrium: Left atrial size was mildly dilated. Right Atrium: Right atrial size was mildly dilated. Pericardium: There is no evidence of pericardial effusion. Mitral Valve: The mitral valve is normal in structure. Trivial mitral valve regurgitation. No evidence of mitral valve stenosis. Tricuspid Valve: The tricuspid valve is normal in structure. Tricuspid valve regurgitation is mild . No evidence of tricuspid stenosis. Aortic Valve: The aortic valve is normal in structure. Aortic valve regurgitation is not visualized. No aortic stenosis is present. Aortic valve mean gradient measures 2.0 mmHg. Aortic valve peak gradient measures 3.1 mmHg. Aortic valve area, by VTI measures 3.06 cm. Pulmonic Valve: The pulmonic valve was normal in structure. Pulmonic  valve regurgitation is not visualized. No evidence of pulmonic stenosis. Aorta: The aortic root is normal in size and structure. Venous: The inferior vena cava is normal in size with greater than 50% respiratory variability, suggesting right atrial pressure of 3 mmHg. IAS/Shunts: No atrial level shunt detected by color flow Doppler.  LEFT VENTRICLE PLAX 2D LVIDd:         4.17 cm   Diastology LVIDs:         3.16 cm   LV e' medial:  4.24 cm/s LV PW:         1.59 cm   LV e' lateral: 5.87 cm/s LV IVS:        1.34 cm LVOT diam:     2.10 cm LV SV:         55 LV SV Index:   27 LVOT Area:     3.46 cm  RIGHT VENTRICLE RV S prime:     14.40 cm/s LEFT ATRIUM         Index LA diam:    4.00 cm 1.94 cm/m  AORTIC VALVE                    PULMONIC VALVE AV Area (Vmax):    3.10 cm     PV Vmax:       0.84 m/s AV Area (Vmean):   3.10 cm     PV Vmean:      62.900 cm/s AV Area (VTI):     3.06 cm     PV VTI:        0.183 m AV Vmax:           87.50 cm/s   PV Peak grad:  2.8 mmHg AV Vmean:          66.900 cm/s  PV Mean grad:  2.0 mmHg AV VTI:            0.179 m AV Peak Grad:      3.1 mmHg AV Mean Grad:      2.0 mmHg LVOT Vmax:         78.30 cm/s LVOT Vmean:         59.900 cm/s LVOT VTI:          0.158 m LVOT/AV VTI ratio: 0.88  AORTA Ao Root diam: 4.00 cm  SHUNTS Systemic VTI:  0.16 m Systemic Diam: 2.10 cm Neoma Laming Electronically signed by Neoma Laming Signature Date/Time: 10/11/2021/10:22:49 AM    Final    CT Renal Stone Study  Result Date: 10/10/2021 CLINICAL DATA:  Respiratory illness. Also flank pain. Concern for kidney stone. EXAM: CT CHEST, ABDOMEN AND PELVIS WITHOUT CONTRAST TECHNIQUE: Multidetector CT imaging of the chest, abdomen and pelvis was performed following the standard protocol without IV contrast. RADIATION DOSE REDUCTION: This exam was performed according to the departmental dose-optimization program which includes automated exposure control, adjustment of the mA and/or kV according to patient size and/or use of iterative reconstruction technique. COMPARISON:  Chest radiograph dated 10/10/2021 and CT dated 09/17/2019 and 08/08/2020. FINDINGS: Evaluation of this exam is limited in the absence of intravenous contrast. CT CHEST FINDINGS Cardiovascular: There is no cardiomegaly or pericardial effusion. Advanced 3 vessel coronary vascular calcification. Mild atherosclerotic calcification of the thoracic aorta. No aneurysmal dilatation. The central pulmonary arteries are grossly unremarkable. Mediastinum/Nodes: No hilar or mediastinal adenopathy. The esophagus is grossly unremarkable. No mediastinal fluid collection. Lungs/Pleura: Minimal scarring in the lingula. A 4 mm left lower lobe subpleural nodule (139/3) similar  to study of 2021. A 4 mm nodule along the right fissure (87/3) similar to 2021. A 3 mm right middle lobe nodule (131/3) similar to 2021. No focal consolidation, pleural effusion, pneumothorax. The central airways are patent. Musculoskeletal: Degenerative changes of the spine. No acute osseous pathology. CT ABDOMEN PELVIS FINDINGS No intra-abdominal free air or free fluid. Hepatobiliary: No focal liver abnormality is seen. No  gallstones, gallbladder wall thickening, or biliary dilatation. Pancreas: Unremarkable. No pancreatic ductal dilatation or surrounding inflammatory changes. Spleen: Normal in size without focal abnormality. Adrenals/Urinary Tract: The adrenal glands unremarkable. Nonobstructing bilateral renal calculi measure up to 7 mm in the inferior pole of the left kidney. There is no hydronephrosis on either side. The visualized ureters and urinary bladder appear unremarkable. Stomach/Bowel: There is a 3 cm duodenal diverticulum. There is sigmoid diverticulosis with muscular hypertrophy. No active inflammatory changes. There is diffuse scattered colonic diverticula. There is no bowel obstruction or active inflammation. The appendix is normal. Vascular/Lymphatic: Moderate aortoiliac atherosclerotic disease. The IVC is unremarkable. No portal venous gas. There is no adenopathy. Reproductive: The prostate and seminal vesicles are grossly unremarkable. No pelvic mass. Other: None Musculoskeletal: Multilevel degenerative changes with disc desiccation and vacuum phenomena. No acute osseous pathology. IMPRESSION: 1. No acute intrathoracic, abdominal, or pelvic pathology. 2. Nonobstructing bilateral renal calculi. No hydronephrosis. 3. Colonic diverticulosis. No bowel obstruction. Normal appendix. 4. Small bilateral pulmonary nodules measuring up to 4 mm similar to study of 2021. No follow-up needed if patient is low-risk (and has no known or suspected primary neoplasm). Non-contrast chest CT can be considered in 12 months if patient is high-risk. This recommendation follows the consensus statement: Guidelines for Management of Incidental Pulmonary Nodules Detected on CT Images: From the Fleischner Society 2017; Radiology 2017; 284:228-243. 5. Aortic Atherosclerosis (ICD10-I70.0). Electronically Signed   By: Anner Crete M.D.   On: 10/10/2021 23:42   CT CHEST WO CONTRAST  Result Date: 10/10/2021 CLINICAL DATA:  Respiratory  illness. Also flank pain. Concern for kidney stone. EXAM: CT CHEST, ABDOMEN AND PELVIS WITHOUT CONTRAST TECHNIQUE: Multidetector CT imaging of the chest, abdomen and pelvis was performed following the standard protocol without IV contrast. RADIATION DOSE REDUCTION: This exam was performed according to the departmental dose-optimization program which includes automated exposure control, adjustment of the mA and/or kV according to patient size and/or use of iterative reconstruction technique. COMPARISON:  Chest radiograph dated 10/10/2021 and CT dated 09/17/2019 and 08/08/2020. FINDINGS: Evaluation of this exam is limited in the absence of intravenous contrast. CT CHEST FINDINGS Cardiovascular: There is no cardiomegaly or pericardial effusion. Advanced 3 vessel coronary vascular calcification. Mild atherosclerotic calcification of the thoracic aorta. No aneurysmal dilatation. The central pulmonary arteries are grossly unremarkable. Mediastinum/Nodes: No hilar or mediastinal adenopathy. The esophagus is grossly unremarkable. No mediastinal fluid collection. Lungs/Pleura: Minimal scarring in the lingula. A 4 mm left lower lobe subpleural nodule (139/3) similar to study of 2021. A 4 mm nodule along the right fissure (87/3) similar to 2021. A 3 mm right middle lobe nodule (131/3) similar to 2021. No focal consolidation, pleural effusion, pneumothorax. The central airways are patent. Musculoskeletal: Degenerative changes of the spine. No acute osseous pathology. CT ABDOMEN PELVIS FINDINGS No intra-abdominal free air or free fluid. Hepatobiliary: No focal liver abnormality is seen. No gallstones, gallbladder wall thickening, or biliary dilatation. Pancreas: Unremarkable. No pancreatic ductal dilatation or surrounding inflammatory changes. Spleen: Normal in size without focal abnormality. Adrenals/Urinary Tract: The adrenal glands unremarkable. Nonobstructing bilateral renal calculi measure up to  7 mm in the inferior pole of  the left kidney. There is no hydronephrosis on either side. The visualized ureters and urinary bladder appear unremarkable. Stomach/Bowel: There is a 3 cm duodenal diverticulum. There is sigmoid diverticulosis with muscular hypertrophy. No active inflammatory changes. There is diffuse scattered colonic diverticula. There is no bowel obstruction or active inflammation. The appendix is normal. Vascular/Lymphatic: Moderate aortoiliac atherosclerotic disease. The IVC is unremarkable. No portal venous gas. There is no adenopathy. Reproductive: The prostate and seminal vesicles are grossly unremarkable. No pelvic mass. Other: None Musculoskeletal: Multilevel degenerative changes with disc desiccation and vacuum phenomena. No acute osseous pathology. IMPRESSION: 1. No acute intrathoracic, abdominal, or pelvic pathology. 2. Nonobstructing bilateral renal calculi. No hydronephrosis. 3. Colonic diverticulosis. No bowel obstruction. Normal appendix. 4. Small bilateral pulmonary nodules measuring up to 4 mm similar to study of 2021. No follow-up needed if patient is low-risk (and has no known or suspected primary neoplasm). Non-contrast chest CT can be considered in 12 months if patient is high-risk. This recommendation follows the consensus statement: Guidelines for Management of Incidental Pulmonary Nodules Detected on CT Images: From the Fleischner Society 2017; Radiology 2017; [id number]. 5. Aortic Atherosclerosis (ICD10-I70.0). Electronically Signed   By: Anner Crete M.D.   On: 10/10/2021 23:42   DG Chest Port 1 View  Result Date: 10/10/2021 CLINICAL DATA:  Questionable sepsis. EXAM: PORTABLE CHEST 1 VIEW COMPARISON:  Chest radiograph dated 08/08/2020. FINDINGS: No focal consolidation, pleural effusion, or pneumothorax. Stable cardiac silhouette. No acute osseous pathology. IMPRESSION: No active disease. Electronically Signed   By: Anner Crete M.D.   On: 10/10/2021 22:09     Assessment and Plan: * Acute  respiratory failure ([place]) Patient was admitted for the same issue approximately 2 days ago and left AGAINST MEDICAL ADVICE IV Solu-Medrol Antibiotics Inhaled steroids Short and long-acting beta muscarinic agonist. On BiPAP admitted to progressive care unit  Opiate dependence ([place]) COWS score with nursing  Benign essential HTN Continue amlodipine, Coreg, clonidine, Imdur   CAD (coronary artery disease) With history of stent placement in the past elevated troponin today likely demand ischemia On aspirin and Plavix Continue statin  AKI (acute kidney injury) (HCC) Serum creatinine is 1.76 today which is down from prior levels Avoid nephrotoxic agents Trend  GERD (gastroesophageal reflux disease) Consist continue Protonix  Elevated troponin Likely related to demand ischemia but with history of coronary artery disease will trend potential cardiology consult in the a.m.  IV drug abuse ([place]) No evidence of sepsis at present      Advance Care Planning:   Code Status: Prior full  Consults: Likely need cardiology consult in the morning  Family Communication: None at bedside  Severity of Illness: The appropriate patient status for this patient is INPATIENT. Inpatient status is judged to be reasonable and necessary in order to provide the required intensity of service to ensure the patient's safety. The patient's presenting symptoms, physical exam findings, and initial radiographic and laboratory data in the context of their chronic comorbidities is felt to place them at high risk for further clinical deterioration. Furthermore, it is not anticipated that the patient will be medically stable for discharge from the hospital within 2 midnights of admission.   * I certify that at the point of admission it is my clinical judgment that the patient will require inpatient hospital care spanning beyond 2 midnights from the point of admission due to high intensity of service, high risk for  further deterioration and high frequency of surveillance  required.*  Author: Donnamae Jude, MD 11/01/2021 1:27 AM  For on call review [email or web address].

## 2021-11-01 NOTE — ED Notes (Signed)
Pt assisted with phone use and also given a pillow by request.

## 2021-11-01 NOTE — Assessment & Plan Note (Addendum)
Secondary to acute systolic CHF exacerbation and COPD exacerbation plus pneumonia.  Continue supplemental oxygen.  Patient able to be weaned off of BiPAP and then high flow oxygen and down to 2 L nasal cannula with keeping oxygen saturations above 90% room air at rest, but with ambulation, oxygen saturations on room air went down to 87%.

## 2021-11-01 NOTE — Assessment & Plan Note (Addendum)
Likely related to CHF, but with history of coronary artery disease, will follow numbers.

## 2021-11-01 NOTE — ED Notes (Signed)
Informed RN bed assigned 

## 2021-11-01 NOTE — ED Notes (Signed)
Pt is satting in the 91-90%. RT is aware. Was told pt needs to blow off CO2. Will continue to monitor

## 2021-11-01 NOTE — Assessment & Plan Note (Signed)
Echocardiogram done 3 weeks ago noted ejection fraction of 45 to 50% with normal diastolic function.  As compared to an echocardiogram done several years ago, this is actually a mild improvement.  He was treated with Lasix and by time of discharge, had diuresed almost 6 L.

## 2021-11-01 NOTE — Assessment & Plan Note (Addendum)
Monitor for withdrawals.  Urine drug screen today positive for cocaine and THC.  Patient has done IV heroin in the past.  He denies smoking crack.

## 2021-11-01 NOTE — Progress Notes (Signed)
ANTICOAGULATION CONSULT NOTE  Pharmacy Consult for heparin infusion Indication: ACS/STEMI  Allergies  Allergen Reactions   Zyvox [Linezolid] Other (See Comments)    Nerve damage (neuropathy) Retinal hemmorhage    Patient Measurements:   Heparin Dosing Weight: 81.6 kg  Vital Signs: Temp: 98.4 F (36.9 C) (07/02 2337) Temp Source: Axillary (07/02 2337) BP: 105/61 (07/03 0030) Pulse Rate: 97 (07/03 0030)  Labs: Recent Labs    10/29/21 2359 10/30/21 0455 10/31/21 2342  HGB 14.5 13.0 13.4  HCT 47.5 42.6 43.1  PLT 203 146* 192  LABPROT 13.7  --   --   INR 1.1  --   --   CREATININE 2.39* 2.14* 1.76*  TROPONINIHS  --   --  399*    Estimated Creatinine Clearance: 45.4 mL/min (A) (by C-G formula based on SCr of 1.76 mg/dL (H)).   Medical History: Past Medical History:  Diagnosis Date   CHF (congestive heart failure) (HCC)    COPD (chronic obstructive pulmonary disease) (HCC)    Dyspnea    GERD (gastroesophageal reflux disease)    History of kidney stones    Hypertension    Myocardial infarction [facility]) 2020   Assessment: Pt is 71 yo male w/ hx of CVD returning to ED via EMS c/o SOB after leaving AMA on 10/31/21, now found w/ elevated troponin I.  Goal of Therapy:  Heparin level 0.3-0.7 units/ml Monitor platelets by anticoagulation protocol: Yes   Plan:  Bolus 4000 units x 1 Start heparin infusion at 1050 units/hr Check HL in 6 hr after start of infusion CBC daily while on heparin.  Renda Rolls, PharmD, MBA 11/01/2021 1:23 AM

## 2021-11-01 NOTE — Assessment & Plan Note (Signed)
Consist continue Protonix

## 2021-11-01 NOTE — Progress Notes (Addendum)
[facility] Progress Note  Patient: Theodore Obrien    ZDG:[id number]  DOA: 10/31/2021    Date of Service: the patient was seen and examined on 11/01/2021  Brief hospital course: Patient is a 71 year old male with past medical history of systolic heart failure with recent echocardiogram noting ejection fraction of 45%, polysubstance abuse including IV drug use and hypertension who presented to the emergency room on 7/1 with shortness of breath and at that time, required BiPAP until oxygen saturations improved down to 8 L.  Patient was found to have a COPD exacerbation and was admitted to hospitalist service.  However once he was able to wean off of BiPAP onto high flow nasal cannula, patient left AGAINST MEDICAL ADVICE.  He returned back on the night of 7/2 with more shortness of breath.  Work-up revealed acute hypoxia requiring BiPAP and felt to be secondary to pneumonia, COPD exacerbation and acute systolic heart failure.  Patient started on IV Lasix, steroids, nebulizers, antibiotics and oxygen.  He was able to be weaned off of BiPAP and is currently at 10 L high flow nasal cannula.  Assessment and Plan: Assessment and Plan: * Acute respiratory failure with hypoxia ([place]) Secondary to acute systolic CHF exacerbation and COPD exacerbation plus pneumonia.  Continue supplemental oxygen.  Currently patient on his 15 L high flow.  Acute on chronic systolic CHF (congestive heart failure) (HCC) Echocardiogram done 3 weeks ago noted ejection fraction of 45 to 50% with normal diastolic function.  As compared to an echocardiogram done several years ago, this is actually a mild improvement.  Continue Lasix, monitoring renal function.  COPD with acute exacerbation (HCC) Continue steroids and nebulizers plus supplemental oxygen  CAP (community acquired pneumonia) Elevated procalcitonin level confirmed infection.  For now, Rocephin and Zithromax.  (Patient does have previous history of prolonged QT, however  EKG on admission reviewed and is normal.  Therefore, okay for Zithromax.)  Follow-up procalcitonin.  Elevated troponin Likely related to CHF, but with history of coronary artery disease, will follow numbers.  Polysubstance abuse ([place]) Monitor for withdrawals.  Urine drug screen today positive for cocaine and THC.  Patient has done IV heroin in the past.  He denies smoking crack.  AKI (acute kidney injury) ([place]) Patient serum creatinine was normal back in April 2022.  A week or so ago, elevated creatinine, although this likely from decompensated heart failure and dehydration.  For now, monitor while on Lasix.  IV drug abuse (HCC) No evidence of sepsis at present  CAD (coronary artery disease) With history of stent placement in the past elevated troponin today likely demand ischemia On aspirin and Plavix Continue statin  Malnutrition of moderate degree Nutrition to see  Benign essential HTN Continue amlodipine, Coreg, clonidine, Imdur   GERD (gastroesophageal reflux disease) Consist continue Protonix       Body mass index is 21.76 kg/m.        Consultants: None  Procedures: None  Antimicrobials: IV Rocephin and Zithromax 7/3-present  Code Status: Full code   Subjective: Patient seen the emergency room, states that breathing is a little bit better  Objective: Vital signs were reviewed and unremarkable. Vitals:   11/01/21 1710 11/01/21 1811  BP: 128/79 (!) 151/98  Pulse: 90 91  Resp:  20  Temp:  98.1 F (36.7 C)  SpO2:  97%    Intake/Output Summary (Last 24 hours) at 11/01/2021 1856 Last data filed at 11/01/2021 1811 Gross per 24 hour  Intake 471.51 ml  Output 2175 ml  Net -1703.49 ml   Filed Weights   11/01/21 1811  Weight: 74.8 kg   Body mass index is 21.76 kg/m.  Exam:  General: Alert and oriented x3, no acute distress HEENT: Normocephalic, atraumatic, mucous membranes are slightly dry Cardiovascular: Regular rate and rhythm, S1-S2, 2 out  of 6 systolic ejection murmur Respiratory: Scattered Rales bilaterally Abdomen: Soft, nontender, nondistended, positive bowel sounds Musculoskeletal: No clubbing or cyanosis, trace pitting edema Skin: No skin breaks, tears or lesions Psychiatry: Appropriate, no evidence of psychoses Neurology: No focal deficits  Data Reviewed: Renal function improving.  Follow-up troponin pending.  Elevated BNP of almost 1200.  Procalcitonin of 0.56.  Disposition:  Status is: Inpatient Remains inpatient appropriate because: Stabilization of hypoxia    Anticipated discharge date: 7/5 or 7/6  Remaining issues to be resolved so that patient can be discharged:  -Full diuresis -Decreasing troponins -Wean off of oxygen -Heparin infusion  Family Communication: No family DVT Prophylaxis:     Author: Annita Brod ,MD 11/01/2021 6:56 PM  To reach On-call, see care teams to locate the attending and reach out via [email or web address]. Between 7PM-7AM, please contact night-coverage If you still have difficulty reaching the attending provider, please page the [facility] (Director on Call) for [facility] on amion for assistance.

## 2021-11-01 NOTE — Assessment & Plan Note (Addendum)
No evidence of sepsis at present

## 2021-11-01 NOTE — Assessment & Plan Note (Signed)
With history of stent placement in the past elevated troponin today likely demand ischemia On aspirin and Plavix Continue statin

## 2021-11-01 NOTE — Assessment & Plan Note (Signed)
Patient was admitted for the same issue approximately 2 days ago and left AGAINST MEDICAL ADVICE IV Solu-Medrol Antibiotics Inhaled steroids Short and long-acting beta muscarinic agonist. On BiPAP admitted to progressive care unit

## 2021-11-01 NOTE — Assessment & Plan Note (Signed)
Continue steroids and nebulizers plus supplemental oxygen

## 2021-11-01 NOTE — Assessment & Plan Note (Addendum)
Patient serum creatinine was normal back in April 2022.  A week or so ago, elevated creatinine, although this likely from decompensated heart failure and dehydration.  Despite Lasix, by day of discharge, creatinine down to 1.35.

## 2021-11-01 NOTE — Assessment & Plan Note (Signed)
  Nutrition to see 

## 2021-11-01 NOTE — Progress Notes (Signed)
ANTICOAGULATION CONSULT NOTE  Pharmacy Consult for heparin infusion Indication: ACS/STEMI  Allergies  Allergen Reactions   Zyvox [Linezolid] Other (See Comments)    Nerve damage (neuropathy) Retinal hemmorhage    Patient Measurements:   Heparin Dosing Weight: 81.6 kg  Vital Signs: Temp: 98.4 F (36.9 C) (07/02 2337) Temp Source: Axillary (07/02 2337) BP: 157/102 (07/03 0827) Pulse Rate: 85 (07/03 0827)  Labs: Recent Labs    10/29/21 2359 10/30/21 0455 10/31/21 2342 11/01/21 0110 11/01/21 0824  HGB 14.5 13.0 13.4  --  12.3*  HCT 47.5 42.6 43.1  --  39.3  PLT 203 146* 192  --  126*  APTT  --   --  32  --   --   LABPROT 13.7  --  14.3  --   --   INR 1.1  --  1.1  --   --   HEPARINUNFRC  --   --   --   --  0.12*  CREATININE 2.39* 2.14* 1.76*  --   --   TROPONINIHS  --   --  399* 449*  --      Estimated Creatinine Clearance: 45.4 mL/min (A) (by C-G formula based on SCr of 1.76 mg/dL (H)).   Medical History: Past Medical History:  Diagnosis Date   CHF (congestive heart failure) (HCC)    COPD (chronic obstructive pulmonary disease) (HCC)    Dyspnea    GERD (gastroesophageal reflux disease)    History of kidney stones    Hypertension    Myocardial infarction [facility]) 2020   Assessment: Pt is 71 yo male w/ hx of CVD returning to ED via EMS c/o SOB after leaving AMA on 10/31/21, now found w/ elevated troponin I.  7/3 0824 HL 0.12   Goal of Therapy:  Heparin level 0.3-0.7 units/ml Monitor platelets by anticoagulation protocol: Yes   Plan:  Heparin level is subtherapeutic. Will give heparin bolus of 2500 units x 1 and increase heparin infusion to 1300 units/hr. Recheck heparin level in 6 hours. CBC daily while on heparin.    Eleonore Chiquito, PharmD, 11/01/2021 9:09 AM

## 2021-11-01 NOTE — Assessment & Plan Note (Signed)
Continue amlodipine, Coreg, clonidine, Imdur

## 2021-11-01 NOTE — Progress Notes (Signed)
ANTICOAGULATION CONSULT NOTE  Pharmacy Consult for heparin infusion Indication: ACS/STEMI  Allergies  Allergen Reactions   Zyvox [Linezolid] Other (See Comments)    Nerve damage (neuropathy) Retinal hemmorhage    Patient Measurements:   Heparin Dosing Weight: 81.6 kg  Vital Signs: Temp: 98.1 F (36.7 C) (07/03 1811) Temp Source: Oral (07/03 1811) BP: 151/98 (07/03 1811) Pulse Rate: 91 (07/03 1811)  Labs: Recent Labs    10/29/21 2359 10/30/21 0455 10/31/21 2342 11/01/21 0110 11/01/21 0824 11/01/21 1718  HGB 14.5 13.0 13.4  --  12.3*  --   HCT 47.5 42.6 43.1  --  39.3  --   PLT 203 146* 192  --  126*  --   APTT  --   --  32  --   --   --   LABPROT 13.7  --  14.3  --   --   --   INR 1.1  --  1.1  --   --   --   HEPARINUNFRC  --   --   --   --  0.12* 0.17*  CREATININE 2.39* 2.14* 1.76*  --  1.52*  --   TROPONINIHS  --   --  399* 449*  --  165*    Estimated Creatinine Clearance: 52.6 mL/min (A) (by C-G formula based on SCr of 1.52 mg/dL (H)).   Medical History: Past Medical History:  Diagnosis Date   CHF (congestive heart failure) (HCC)    COPD (chronic obstructive pulmonary disease) (HCC)    Dyspnea    GERD (gastroesophageal reflux disease)    History of kidney stones    Hypertension    Myocardial infarction [facility]) 2020   Assessment: Pt is 71 yo male w/ hx of CVD returning to ED via EMS c/o SOB after leaving AMA on 10/31/21, now found w/ elevated troponin I.  Date Time HL Rate/comment 7/3  0824  0.12  1050 un/hr, subtherapeutic 7/3  1718  0.17 1300 un/hr, subtherapeutic  Goal of Therapy:  Heparin level 0.3-0.7 units/ml Monitor platelets by anticoagulation protocol: Yes   Plan:  Heparin level is subtherapeutic Will give heparin bolus of 2500 units x 1 and increase heparin infusion to 1600 units/hr Recheck heparin level in 6 hours Monitor daily CBC, s/s of bleed   Darnelle Bos, PharmD 11/01/2021 6:15 PM

## 2021-11-01 NOTE — Assessment & Plan Note (Signed)
Elevated procalcitonin level confirmed infection.  For now, Rocephin and Zithromax.  (Patient does have previous history of prolonged QT, however EKG on admission reviewed and is normal.  Therefore, okay for Zithromax.)  Follow-up procalcitonin.

## 2021-11-01 NOTE — Hospital Course (Signed)
Patient is a 71 year old male with past medical history of systolic heart failure with recent echocardiogram noting ejection fraction of 45%, polysubstance abuse including IV drug use and hypertension who presented to the emergency room on 7/1 with shortness of breath and at that time, required BiPAP until oxygen saturations improved down to 8 L.  Patient was found to have a COPD exacerbation and was admitted to hospitalist service.  However once he was able to wean off of BiPAP onto high flow nasal cannula, patient left AGAINST MEDICAL ADVICE.  He returned back on the night of 7/2 with more shortness of breath.  Work-up revealed acute hypoxia requiring BiPAP and felt to be secondary to pneumonia, COPD exacerbation and acute systolic heart failure.  Patient started on IV Lasix, steroids, nebulizers, antibiotics and oxygen.  He was able to be weaned off of BiPAP.  By 7/4, patient's breathing had much improved and on room air at rest, at 92%.  With ambulation, suction saturations were down to 87%.  We had extensive discussion had patient said that no matter what, he was going to leave on 7/4.  We were able to compromise and that he was formally discharged with prescriptions for Lasix, steroid taper and antibiotics.

## 2021-11-02 DIAGNOSIS — N179 Acute kidney failure, unspecified: Secondary | ICD-10-CM | POA: Diagnosis not present

## 2021-11-02 DIAGNOSIS — E43 Unspecified severe protein-calorie malnutrition: Secondary | ICD-10-CM | POA: Insufficient documentation

## 2021-11-02 DIAGNOSIS — J441 Chronic obstructive pulmonary disease with (acute) exacerbation: Secondary | ICD-10-CM | POA: Diagnosis not present

## 2021-11-02 DIAGNOSIS — I5023 Acute on chronic systolic (congestive) heart failure: Secondary | ICD-10-CM | POA: Diagnosis not present

## 2021-11-02 DIAGNOSIS — J9601 Acute respiratory failure with hypoxia: Secondary | ICD-10-CM | POA: Diagnosis not present

## 2021-11-02 LAB — CBC
HCT: 35.7 % — ABNORMAL LOW (ref 39.0–52.0)
Hemoglobin: 11.8 g/dL — ABNORMAL LOW (ref 13.0–17.0)
MCH: 27.6 pg (ref 26.0–34.0)
MCHC: 33.1 g/dL (ref 30.0–36.0)
MCV: 83.6 fL (ref 80.0–100.0)
Platelets: 142 10*3/uL — ABNORMAL LOW (ref 150–400)
RBC: 4.27 MIL/uL (ref 4.22–5.81)
RDW: 13.1 % (ref 11.5–15.5)
WBC: 6.4 10*3/uL (ref 4.0–10.5)
nRBC: 0 % (ref 0.0–0.2)

## 2021-11-02 LAB — BASIC METABOLIC PANEL
Anion gap: 10 (ref 5–15)
BUN: 38 mg/dL — ABNORMAL HIGH (ref 8–23)
CO2: 30 mmol/L (ref 22–32)
Calcium: 9 mg/dL (ref 8.9–10.3)
Chloride: 97 mmol/L — ABNORMAL LOW (ref 98–111)
Creatinine, Ser: 1.35 mg/dL — ABNORMAL HIGH (ref 0.61–1.24)
GFR, Estimated: 57 mL/min — ABNORMAL LOW (ref >60)
Glucose, Bld: 195 mg/dL — ABNORMAL HIGH (ref 70–99)
Potassium: 3.4 mmol/L — ABNORMAL LOW (ref 3.5–5.1)
Sodium: 137 mmol/L (ref 135–145)

## 2021-11-02 LAB — GLUCOSE, CAPILLARY
Glucose-Capillary: 193 mg/dL — ABNORMAL HIGH (ref 70–99)
Glucose-Capillary: 210 mg/dL — ABNORMAL HIGH (ref 70–99)
Glucose-Capillary: 187 mg/dL — ABNORMAL HIGH (ref 70–99)

## 2021-11-02 LAB — HEPARIN LEVEL (UNFRACTIONATED)
Heparin Unfractionated: 0.34 IU/mL (ref 0.30–0.70)
Heparin Unfractionated: 0.35 IU/mL (ref 0.30–0.70)

## 2021-11-02 LAB — PROCALCITONIN: Procalcitonin: 0.38 ng/mL

## 2021-11-02 LAB — HEMOGLOBIN A1C
Hgb A1c MFr Bld: 6.5 % — ABNORMAL HIGH (ref 4.8–5.6)
Mean Plasma Glucose: 140 mg/dL

## 2021-11-02 MED ORDER — METHOCARBAMOL 500 MG PO TABS
750.0000 mg | ORAL_TABLET | Freq: Once | ORAL | Status: AC
  Administered 2021-11-02: 750 mg via ORAL
  Filled 2021-11-02: qty 2

## 2021-11-02 MED ORDER — ENSURE MAX PROTEIN PO LIQD
11.0000 [oz_av] | Freq: Two times a day (BID) | ORAL | Status: DC
  Filled 2021-11-02: qty 330

## 2021-11-02 MED ORDER — FUROSEMIDE 20 MG PO TABS: 20.0000 mg | ORAL_TABLET | Freq: Every day | ORAL | 11 refills | Status: AC

## 2021-11-02 MED ORDER — FUROSEMIDE 10 MG/ML IJ SOLN
40.0000 mg | Freq: Once | INTRAMUSCULAR | Status: AC
Start: 2021-11-02 — End: 2021-11-02
  Administered 2021-11-02: 40 mg via INTRAVENOUS
  Filled 2021-11-02: qty 4

## 2021-11-02 MED ORDER — AZITHROMYCIN 250 MG PO TABS
250.0000 mg | ORAL_TABLET | Freq: Every day | ORAL | 0 refills | Status: AC
Start: 2021-11-02 — End: ?

## 2021-11-02 MED ORDER — PREDNISONE 20 MG PO TABS: ORAL_TABLET | ORAL | 0 refills | Status: AC

## 2021-11-02 NOTE — Progress Notes (Signed)
[place] for heparin infusion Indication: ACS/STEMI  Allergies  Allergen Reactions   Zyvox [Linezolid] Other (See Comments)    Nerve damage (neuropathy) Retinal hemmorhage    Patient Measurements: Height: '6\' 1"'$  (185.4 cm) Weight: 74.8 kg (164 lb 14.4 oz) IBW/kg (Calculated) : 79.9 Heparin Dosing Weight: 81.6 kg  Vital Signs: Temp: 98.1 F (36.7 C) (07/03 2018) Temp Source: Oral (07/03 1811) BP: 155/97 (07/03 2018) Pulse Rate: 90 (07/03 2018)  Labs: Recent Labs    10/30/21 0455 10/31/21 2342 11/01/21 0110 11/01/21 0824 11/01/21 1718 11/02/21 0048  HGB 13.0 13.4  --  12.3*  --   --   HCT 42.6 43.1  --  39.3  --   --   PLT 146* 192  --  126*  --   --   APTT  --  32  --   --   --   --   LABPROT  --  14.3  --   --   --   --   INR  --  1.1  --   --   --   --   HEPARINUNFRC  --   --   --  0.12* 0.17* 0.34  CREATININE 2.14* 1.76*  --  1.52*  --   --   TROPONINIHS  --  399* 449*  --  165*  --      Estimated Creatinine Clearance: 49.2 mL/min (A) (by C-G formula based on SCr of 1.52 mg/dL (H)).   Medical History: Past Medical History:  Diagnosis Date   CHF (congestive heart failure) (HCC)    COPD (chronic obstructive pulmonary disease) (HCC)    Dyspnea    GERD (gastroesophageal reflux disease)    History of kidney stones    Hypertension    Myocardial infarction [facility]) 2020   Assessment: Pt is 71 yo male w/ hx of CVD returning to ED via EMS c/o SOB after leaving AMA on 10/31/21, now found w/ elevated troponin I.  Date Time HL Rate/comment 7/3  0824  0.12  1050 un/hr, subtherapeutic 7/3  1718  0.17 1300 un/hr, subtherapeutic 7/4       0048   0.34      1600 un/hr,  therapeutic X 1   Goal of Therapy:  Heparin level 0.3-0.7 units/ml Monitor platelets by anticoagulation protocol: Yes   Plan:  7/4:  HL @ 6440 = 0.34, therapeutic X 1 Will continue pt on current rate and recheck HL on 7/4 @ 0700.   Sharine Cadle D,  PharmD 11/02/2021 3:10 AM

## 2021-11-02 NOTE — Progress Notes (Signed)
[place] for heparin infusion Indication: ACS/STEMI  Allergies  Allergen Reactions   Zyvox [Linezolid] Other (See Comments)    Nerve damage (neuropathy) Retinal hemmorhage    Patient Measurements: Height: '6\' 1"'$  (185.4 cm) Weight: 74.9 kg (165 lb 2 oz) IBW/kg (Calculated) : 79.9 Heparin Dosing Weight: 81.6 kg  Vital Signs: Temp: 98.5 F (36.9 C) (07/04 0752) Temp Source: Oral (07/04 0414) BP: 162/93 (07/04 0752) Pulse Rate: 70 (07/04 0752)  Labs: Recent Labs    10/31/21 2342 10/31/21 2342 11/01/21 0110 11/01/21 0824 11/01/21 1718 11/02/21 0048 11/02/21 0656  HGB 13.4  --   --  12.3*  --   --  11.8*  HCT 43.1  --   --  39.3  --   --  35.7*  PLT 192  --   --  126*  --   --  142*  APTT 32  --   --   --   --   --   --   LABPROT 14.3  --   --   --   --   --   --   INR 1.1  --   --   --   --   --   --   HEPARINUNFRC  --    < >  --  0.12* 0.17* 0.34 0.35  CREATININE 1.76*  --   --  1.52*  --   --  1.35*  TROPONINIHS 399*  --  449*  --  165*  --   --    < > = values in this interval not displayed.     Estimated Creatinine Clearance: 55.5 mL/min (A) (by C-G formula based on SCr of 1.35 mg/dL (H)).   Medical History: Past Medical History:  Diagnosis Date   CHF (congestive heart failure) (HCC)    COPD (chronic obstructive pulmonary disease) (HCC)    Dyspnea    GERD (gastroesophageal reflux disease)    History of kidney stones    Hypertension    Myocardial infarction [facility]) 2020   Assessment: Pt is 71 yo male w/ hx of CVD returning to ED via EMS c/o SOB after leaving AMA on 10/31/21, now found w/ elevated troponin I.  Date Time HL Rate/comment 7/3  0824  0.12  1050 un/hr, subtherapeutic 7/3  1718  0.17 1300 un/hr, subtherapeutic 7/4       0048   0.34      1600 un/hr,  therapeutic X 1  7/4   0656 0.35  therapeutic X 2  Goal of Therapy:  Heparin level 0.3-0.7 units/ml Monitor platelets by anticoagulation protocol: Yes    Plan:  7/4:  HL @ 0656= 0.35, therapeutic X 2 Will continue pt at 1600 units/hr and recheck HL on 7/5 with am labs CBC daily  Cayci Mcnabb A, PharmD 11/02/2021 8:07 AM

## 2021-11-02 NOTE — Discharge Summary (Signed)
Physician Discharge Summary   Patient: Theodore Obrien MRN: [id number] DOB: 24-Dec-1950  Admit date:     10/31/2021  Discharge date: {dischdate:26783}  Discharge Physician: Annita Brod   PCP: Tracie Harrier, MD   Recommendations at discharge:  {Tip this will not be part of the note when signed- Example include specific recommendations for outpatient follow-up, pending tests to follow-up on. (Optional):26781}  ***  Discharge Diagnoses: Principal Problem:   Acute respiratory failure with hypoxia (HCC) Active Problems:   Acute on chronic systolic CHF (congestive heart failure) (HCC)   COPD with acute exacerbation (HCC)   CAP (community acquired pneumonia)   Polysubstance abuse ([place])   Elevated troponin   IV drug abuse (HCC)   AKI (acute kidney injury) ([place])   CAD (coronary artery disease)   Malnutrition of moderate degree   Benign essential HTN   GERD (gastroesophageal reflux disease)   Protein-calorie malnutrition, severe  Resolved Problems:   * No resolved hospital problems. [facility] Course: Patient is a 71 year old male with past medical history of systolic heart failure with recent echocardiogram noting ejection fraction of 45%, polysubstance abuse including IV drug use and hypertension who presented to the emergency room on 7/1 with shortness of breath and at that time, required BiPAP until oxygen saturations improved down to 8 L.  Patient was found to have a COPD exacerbation and was admitted to hospitalist service.  However once he was able to wean off of BiPAP onto high flow nasal cannula, patient left AGAINST MEDICAL ADVICE.  He returned back on the night of 7/2 with more shortness of breath.  Work-up revealed acute hypoxia requiring BiPAP and felt to be secondary to pneumonia, COPD exacerbation and acute systolic heart failure.  Patient started on IV Lasix, steroids, nebulizers, antibiotics and oxygen.  He was able to be weaned off of BiPAP and is currently at 10 L  high flow nasal cannula.  Assessment and Plan: * Acute respiratory failure with hypoxia (HCC) Secondary to acute systolic CHF exacerbation and COPD exacerbation plus pneumonia.  Continue supplemental oxygen.  Currently patient on his 15 L high flow.  Acute on chronic systolic CHF (congestive heart failure) (HCC) Echocardiogram done 3 weeks ago noted ejection fraction of 45 to 50% with normal diastolic function.  As compared to an echocardiogram done several years ago, this is actually a mild improvement.  Continue Lasix, monitoring renal function.  COPD with acute exacerbation (HCC) Continue steroids and nebulizers plus supplemental oxygen  CAP (community acquired pneumonia) Elevated procalcitonin level confirmed infection.  For now, Rocephin and Zithromax.  (Patient does have previous history of prolonged QT, however EKG on admission reviewed and is normal.  Therefore, okay for Zithromax.)  Follow-up procalcitonin.  Elevated troponin Likely related to CHF, but with history of coronary artery disease, will follow numbers.  Polysubstance abuse ([place]) Monitor for withdrawals.  Urine drug screen today positive for cocaine and THC.  Patient has done IV heroin in the past.  He denies smoking crack.  AKI (acute kidney injury) ([place]) Patient serum creatinine was normal back in April 2022.  A week or so ago, elevated creatinine, although this likely from decompensated heart failure and dehydration.  For now, monitor while on Lasix.  IV drug abuse (HCC) No evidence of sepsis at present  CAD (coronary artery disease) With history of stent placement in the past elevated troponin today likely demand ischemia On aspirin and Plavix Continue statin  Malnutrition of moderate degree Nutrition to see  Benign essential  HTN Continue amlodipine, Coreg, clonidine, Imdur   GERD (gastroesophageal reflux disease) Consist continue Protonix      {Tip this will not be part of the note when signed Body  mass index is 21.79 kg/m. ,  Nutrition Documentation    [facility] to Hosp-Admission (Current) from 10/31/2021 in [facility]  Nutrition Problem Severe Malnutrition  Etiology chronic illness  [COPD, substance abuse]  Nutrition Goal Patient will meet greater than or equal to 90% of their needs     ,  (Optional):26781}  {(NOTE) Pain control PDMP Statment (Optional):26782} Consultants: *** Procedures performed: ***  Disposition: {Plan; Disposition:26390} Diet recommendation:  Discharge Diet Orders (From admission, onward)     Start     Ordered   11/02/21 0000  Diet - low sodium heart healthy        11/02/21 1638           {Diet_Plan:26776} DISCHARGE MEDICATION: Allergies as of 11/02/2021       Reactions   Zyvox [linezolid] Other (See Comments)   Nerve damage (neuropathy) Retinal hemmorhage        Medication List     TAKE these medications    albuterol 108 (90 Base) MCG/ACT inhaler Commonly known as: VENTOLIN HFA Inhale 2 puffs into the lungs every 6 (six) hours as needed for wheezing or shortness of breath.   ALPRAZolam 0.25 MG tablet Commonly known as: XANAX Take 0.25 mg by mouth 2 (two) times daily as needed for anxiety or sleep.   amLODipine 5 MG tablet Commonly known as: NORVASC Take 5 mg by mouth daily.   aspirin EC 81 MG tablet Take 1 tablet (81 mg total) by mouth daily.   atorvastatin 40 MG tablet Commonly known as: LIPITOR Take 40 mg by mouth daily.   azithromycin 250 MG tablet Commonly known as: ZITHROMAX Take 1 tablet (250 mg total) by mouth daily.   carvedilol 3.125 MG tablet Commonly known as: COREG Take 1 tablet (3.125 mg total) by mouth 2 (two) times daily with a meal. Hold for heart rate less than 60.   cloNIDine 0.1 MG tablet Commonly known as: CATAPRES Take 1 tablet by mouth every 8 (eight) hours.   clopidogrel 75 MG tablet Commonly known as: PLAVIX Take 1 tablet (75 mg total) by mouth daily.    furosemide 20 MG tablet Commonly known as: Lasix Take 1 tablet (20 mg total) by mouth daily.   isosorbide mononitrate 30 MG 24 hr tablet Commonly known as: IMDUR Take 1 tablet (30 mg total) by mouth daily.   pantoprazole 40 MG tablet Commonly known as: PROTONIX Take 1 tablet (40 mg total) by mouth 2 (two) times daily.   predniSONE 20 MG tablet Commonly known as: DELTASONE Take 2 tablets (40 mg total) by mouth daily with breakfast for 2 days, THEN 1.5 tablets (30 mg total) daily with breakfast for 2 days, THEN 1 tablet (20 mg total) daily with breakfast for 2 days, THEN 0.5 tablets (10 mg total) daily with breakfast for 2 days. Start taking on: November 03, 2021        Discharge Exam: Danley Danker Weights   11/01/21 1811 11/02/21 0414  Weight: 74.8 kg 74.9 kg   ***  Condition at discharge: {DC Condition:26389}  The results of significant diagnostics from this hospitalization (including imaging, microbiology, ancillary and laboratory) are listed below for reference.   Imaging Studies: DG Chest Portable 1 View  Result Date: 11/01/2021 CLINICAL DATA:  Acute dyspnea EXAM: PORTABLE CHEST  1 VIEW COMPARISON:  10/30/2021 FINDINGS: Cardiac shadow is prominent but stable. Aortic calcifications are again seen. Lungs are well aerated without focal infiltrate. Chronic bronchial thickening is again noted similar to that seen on the previous day. No new focal abnormality is noted. IMPRESSION: No change from the previous day.  No acute abnormality seen. Electronically Signed   By: Inez Catalina M.D.   On: 11/01/2021 00:08   DG Chest Port 1 View  Result Date: 10/30/2021 CLINICAL DATA:  Questionable sepsis - evaluate for abnormality Shortness of breath. EXAM: PORTABLE CHEST 1 VIEW COMPARISON:  Radiograph and CT 10/10/2021 FINDINGS: Upper normal heart size, unchanged. Aortic atherosclerosis and tortuosity. Chronic hyperinflation and bronchial thickening. No focal airspace disease, pleural effusion, or  pneumothorax. Grossly stable osseous structures. IMPRESSION: 1. No acute abnormality. 2. Chronic hyperinflation and bronchial thickening, consistent with COPD. Electronically Signed   By: Keith Rake M.D.   On: 10/30/2021 00:30   ECHOCARDIOGRAM COMPLETE  Result Date: 10/11/2021    ECHOCARDIOGRAM REPORT   Patient Name:   PERSEUS WESTALL Date of Exam: 10/11/2021 Medical Rec #:  [id number]     Height:       73.0 in Accession #:    [id number]    Weight:       180.0 lb Date of Birth:  [birth year]      BSA:          2.057 m Patient Age:    87 years      BP:           110/69 mmHg Patient Gender: M             HR:           60 bpm. Exam Location:  [facility] Procedure: 2D Echo, Cardiac Doppler and Color Doppler Indications:     Shortness of breath  History:         Patient has prior history of Echocardiogram examinations. CHF,                  CAD and Previous Myocardial Infarction, Cardiac stent, COPD;                  Risk Factors:Former Smoker, Hypertension and Dyslipidemia.  Sonographer:     Rosalia Hammers Referring Phys:  [place] Diagnosing Phys: Neoma Laming  Sonographer Comments: Suboptimal apical window. Image acquisition challenging due to respiratory motion. IMPRESSIONS  1. Left ventricular ejection fraction, by estimation, is 45 to 50%. The left ventricle has mildly decreased function. The left ventricle demonstrates global hypokinesis. The left ventricular internal cavity size was mildly dilated. Left ventricular diastolic parameters are indeterminate.  2. Right ventricular systolic function is normal. The right ventricular size is normal.  3. Left atrial size was mildly dilated.  4. Right atrial size was mildly dilated.  5. The mitral valve is normal in structure. Trivial mitral valve regurgitation. No evidence of mitral stenosis.  6. The aortic valve is normal in structure. Aortic valve regurgitation is not visualized. No aortic stenosis is present.  7. The inferior vena cava is normal in size with  greater than 50% respiratory variability, suggesting right atrial pressure of 3 mmHg. FINDINGS  Left Ventricle: Left ventricular ejection fraction, by estimation, is 45 to 50%. The left ventricle has mildly decreased function. The left ventricle demonstrates global hypokinesis. The left ventricular internal cavity size was mildly dilated. There is  no left ventricular hypertrophy. Left ventricular diastolic parameters are indeterminate. Right Ventricle: The right  ventricular size is normal. No increase in right ventricular wall thickness. Right ventricular systolic function is normal. Left Atrium: Left atrial size was mildly dilated. Right Atrium: Right atrial size was mildly dilated. Pericardium: There is no evidence of pericardial effusion. Mitral Valve: The mitral valve is normal in structure. Trivial mitral valve regurgitation. No evidence of mitral valve stenosis. Tricuspid Valve: The tricuspid valve is normal in structure. Tricuspid valve regurgitation is mild . No evidence of tricuspid stenosis. Aortic Valve: The aortic valve is normal in structure. Aortic valve regurgitation is not visualized. No aortic stenosis is present. Aortic valve mean gradient measures 2.0 mmHg. Aortic valve peak gradient measures 3.1 mmHg. Aortic valve area, by VTI measures 3.06 cm. Pulmonic Valve: The pulmonic valve was normal in structure. Pulmonic valve regurgitation is not visualized. No evidence of pulmonic stenosis. Aorta: The aortic root is normal in size and structure. Venous: The inferior vena cava is normal in size with greater than 50% respiratory variability, suggesting right atrial pressure of 3 mmHg. IAS/Shunts: No atrial level shunt detected by color flow Doppler.  LEFT VENTRICLE PLAX 2D LVIDd:         4.17 cm   Diastology LVIDs:         3.16 cm   LV e' medial:  4.24 cm/s LV PW:         1.59 cm   LV e' lateral: 5.87 cm/s LV IVS:        1.34 cm LVOT diam:     2.10 cm LV SV:         55 LV SV Index:   27 LVOT Area:      3.46 cm  RIGHT VENTRICLE RV S prime:     14.40 cm/s LEFT ATRIUM         Index LA diam:    4.00 cm 1.94 cm/m  AORTIC VALVE                    PULMONIC VALVE AV Area (Vmax):    3.10 cm     PV Vmax:       0.84 m/s AV Area (Vmean):   3.10 cm     PV Vmean:      62.900 cm/s AV Area (VTI):     3.06 cm     PV VTI:        0.183 m AV Vmax:           87.50 cm/s   PV Peak grad:  2.8 mmHg AV Vmean:          66.900 cm/s  PV Mean grad:  2.0 mmHg AV VTI:            0.179 m AV Peak Grad:      3.1 mmHg AV Mean Grad:      2.0 mmHg LVOT Vmax:         78.30 cm/s LVOT Vmean:        59.900 cm/s LVOT VTI:          0.158 m LVOT/AV VTI ratio: 0.88  AORTA Ao Root diam: 4.00 cm  SHUNTS Systemic VTI:  0.16 m Systemic Diam: 2.10 cm Neoma Laming Electronically signed by Neoma Laming Signature Date/Time: 10/11/2021/10:22:49 AM    Final    CT Renal Stone Study  Result Date: 10/10/2021 CLINICAL DATA:  Respiratory illness. Also flank pain. Concern for kidney stone. EXAM: CT CHEST, ABDOMEN AND PELVIS WITHOUT CONTRAST TECHNIQUE: Multidetector CT imaging of the chest, abdomen and pelvis was  performed following the standard protocol without IV contrast. RADIATION DOSE REDUCTION: This exam was performed according to the departmental dose-optimization program which includes automated exposure control, adjustment of the mA and/or kV according to patient size and/or use of iterative reconstruction technique. COMPARISON:  Chest radiograph dated 10/10/2021 and CT dated 09/17/2019 and 08/08/2020. FINDINGS: Evaluation of this exam is limited in the absence of intravenous contrast. CT CHEST FINDINGS Cardiovascular: There is no cardiomegaly or pericardial effusion. Advanced 3 vessel coronary vascular calcification. Mild atherosclerotic calcification of the thoracic aorta. No aneurysmal dilatation. The central pulmonary arteries are grossly unremarkable. Mediastinum/Nodes: No hilar or mediastinal adenopathy. The esophagus is grossly unremarkable. No  mediastinal fluid collection. Lungs/Pleura: Minimal scarring in the lingula. A 4 mm left lower lobe subpleural nodule (139/3) similar to study of 2021. A 4 mm nodule along the right fissure (87/3) similar to 2021. A 3 mm right middle lobe nodule (131/3) similar to 2021. No focal consolidation, pleural effusion, pneumothorax. The central airways are patent. Musculoskeletal: Degenerative changes of the spine. No acute osseous pathology. CT ABDOMEN PELVIS FINDINGS No intra-abdominal free air or free fluid. Hepatobiliary: No focal liver abnormality is seen. No gallstones, gallbladder wall thickening, or biliary dilatation. Pancreas: Unremarkable. No pancreatic ductal dilatation or surrounding inflammatory changes. Spleen: Normal in size without focal abnormality. Adrenals/Urinary Tract: The adrenal glands unremarkable. Nonobstructing bilateral renal calculi measure up to 7 mm in the inferior pole of the left kidney. There is no hydronephrosis on either side. The visualized ureters and urinary bladder appear unremarkable. Stomach/Bowel: There is a 3 cm duodenal diverticulum. There is sigmoid diverticulosis with muscular hypertrophy. No active inflammatory changes. There is diffuse scattered colonic diverticula. There is no bowel obstruction or active inflammation. The appendix is normal. Vascular/Lymphatic: Moderate aortoiliac atherosclerotic disease. The IVC is unremarkable. No portal venous gas. There is no adenopathy. Reproductive: The prostate and seminal vesicles are grossly unremarkable. No pelvic mass. Other: None Musculoskeletal: Multilevel degenerative changes with disc desiccation and vacuum phenomena. No acute osseous pathology. IMPRESSION: 1. No acute intrathoracic, abdominal, or pelvic pathology. 2. Nonobstructing bilateral renal calculi. No hydronephrosis. 3. Colonic diverticulosis. No bowel obstruction. Normal appendix. 4. Small bilateral pulmonary nodules measuring up to 4 mm similar to study of 2021. No  follow-up needed if patient is low-risk (and has no known or suspected primary neoplasm). Non-contrast chest CT can be considered in 12 months if patient is high-risk. This recommendation follows the consensus statement: Guidelines for Management of Incidental Pulmonary Nodules Detected on CT Images: From the Fleischner Society 2017; Radiology 2017; 284:228-243. 5. Aortic Atherosclerosis (ICD10-I70.0). Electronically Signed   By: Anner Crete M.D.   On: 10/10/2021 23:42   CT CHEST WO CONTRAST  Result Date: 10/10/2021 CLINICAL DATA:  Respiratory illness. Also flank pain. Concern for kidney stone. EXAM: CT CHEST, ABDOMEN AND PELVIS WITHOUT CONTRAST TECHNIQUE: Multidetector CT imaging of the chest, abdomen and pelvis was performed following the standard protocol without IV contrast. RADIATION DOSE REDUCTION: This exam was performed according to the departmental dose-optimization program which includes automated exposure control, adjustment of the mA and/or kV according to patient size and/or use of iterative reconstruction technique. COMPARISON:  Chest radiograph dated 10/10/2021 and CT dated 09/17/2019 and 08/08/2020. FINDINGS: Evaluation of this exam is limited in the absence of intravenous contrast. CT CHEST FINDINGS Cardiovascular: There is no cardiomegaly or pericardial effusion. Advanced 3 vessel coronary vascular calcification. Mild atherosclerotic calcification of the thoracic aorta. No aneurysmal dilatation. The central pulmonary arteries are grossly unremarkable. Mediastinum/Nodes: No hilar  or mediastinal adenopathy. The esophagus is grossly unremarkable. No mediastinal fluid collection. Lungs/Pleura: Minimal scarring in the lingula. A 4 mm left lower lobe subpleural nodule (139/3) similar to study of 2021. A 4 mm nodule along the right fissure (87/3) similar to 2021. A 3 mm right middle lobe nodule (131/3) similar to 2021. No focal consolidation, pleural effusion, pneumothorax. The central airways  are patent. Musculoskeletal: Degenerative changes of the spine. No acute osseous pathology. CT ABDOMEN PELVIS FINDINGS No intra-abdominal free air or free fluid. Hepatobiliary: No focal liver abnormality is seen. No gallstones, gallbladder wall thickening, or biliary dilatation. Pancreas: Unremarkable. No pancreatic ductal dilatation or surrounding inflammatory changes. Spleen: Normal in size without focal abnormality. Adrenals/Urinary Tract: The adrenal glands unremarkable. Nonobstructing bilateral renal calculi measure up to 7 mm in the inferior pole of the left kidney. There is no hydronephrosis on either side. The visualized ureters and urinary bladder appear unremarkable. Stomach/Bowel: There is a 3 cm duodenal diverticulum. There is sigmoid diverticulosis with muscular hypertrophy. No active inflammatory changes. There is diffuse scattered colonic diverticula. There is no bowel obstruction or active inflammation. The appendix is normal. Vascular/Lymphatic: Moderate aortoiliac atherosclerotic disease. The IVC is unremarkable. No portal venous gas. There is no adenopathy. Reproductive: The prostate and seminal vesicles are grossly unremarkable. No pelvic mass. Other: None Musculoskeletal: Multilevel degenerative changes with disc desiccation and vacuum phenomena. No acute osseous pathology. IMPRESSION: 1. No acute intrathoracic, abdominal, or pelvic pathology. 2. Nonobstructing bilateral renal calculi. No hydronephrosis. 3. Colonic diverticulosis. No bowel obstruction. Normal appendix. 4. Small bilateral pulmonary nodules measuring up to 4 mm similar to study of 2021. No follow-up needed if patient is low-risk (and has no known or suspected primary neoplasm). Non-contrast chest CT can be considered in 12 months if patient is high-risk. This recommendation follows the consensus statement: Guidelines for Management of Incidental Pulmonary Nodules Detected on CT Images: From the Fleischner Society 2017; Radiology  2017; 284:228-243. 5. Aortic Atherosclerosis (ICD10-I70.0). Electronically Signed   By: Anner Crete M.D.   On: 10/10/2021 23:42   DG Chest Port 1 View  Result Date: 10/10/2021 CLINICAL DATA:  Questionable sepsis. EXAM: PORTABLE CHEST 1 VIEW COMPARISON:  Chest radiograph dated 08/08/2020. FINDINGS: No focal consolidation, pleural effusion, or pneumothorax. Stable cardiac silhouette. No acute osseous pathology. IMPRESSION: No active disease. Electronically Signed   By: Anner Crete M.D.   On: 10/10/2021 22:09    Microbiology: Results for orders placed or performed during the hospital encounter of 10/31/21  Respiratory (~20 pathogens) panel by PCR     Status: None   Collection Time: 10/31/21  5:41 AM   Specimen: Nasopharyngeal Swab; Respiratory  Result Value Ref Range Status   Adenovirus NOT DETECTED NOT DETECTED Final   Coronavirus 229E NOT DETECTED NOT DETECTED Final    Comment: (NOTE) The Coronavirus on the Respiratory Panel, DOES NOT test for the novel  Coronavirus (2019 nCoV)    Coronavirus HKU1 NOT DETECTED NOT DETECTED Final   Coronavirus NL63 NOT DETECTED NOT DETECTED Final   Coronavirus OC43 NOT DETECTED NOT DETECTED Final   Metapneumovirus NOT DETECTED NOT DETECTED Final   Rhinovirus / Enterovirus NOT DETECTED NOT DETECTED Final   Influenza A NOT DETECTED NOT DETECTED Final   Influenza B NOT DETECTED NOT DETECTED Final   Parainfluenza Virus 1 NOT DETECTED NOT DETECTED Final   Parainfluenza Virus 2 NOT DETECTED NOT DETECTED Final   Parainfluenza Virus 3 NOT DETECTED NOT DETECTED Final   Parainfluenza Virus 4 NOT DETECTED NOT  DETECTED Final   Respiratory Syncytial Virus NOT DETECTED NOT DETECTED Final   Bordetella pertussis NOT DETECTED NOT DETECTED Final   Bordetella Parapertussis NOT DETECTED NOT DETECTED Final   Chlamydophila pneumoniae NOT DETECTED NOT DETECTED Final   Mycoplasma pneumoniae NOT DETECTED NOT DETECTED Final    Comment: Performed at [facility], [place] [address], [place], [place] [address]  SARS Coronavirus 2 by RT PCR (hospital order, performed in [facility] hospital lab) *cepheid single result test* Anterior Nasal Swab     Status: None   Collection Time: 10/31/21 11:44 PM   Specimen: Anterior Nasal Swab  Result Value Ref Range Status   SARS Coronavirus 2 by RT PCR NEGATIVE NEGATIVE Final    Comment: Performed at [facility], [address] [place], [place] [address]  Expectorated Sputum Assessment w Gram Stain, Rflx to Resp Cult     Status: None   Collection Time: 11/01/21  8:30 AM   Specimen: Sputum  Result Value Ref Range Status   Specimen Description SPUTUM SPUTUM  Final   Special Requests NONE  Final   Sputum evaluation   Final    THIS SPECIMEN IS ACCEPTABLE FOR SPUTUM CULTURE Performed at [facility], [address] [place], [place] [address]    Report Status 11/01/2021 FINAL  Final  Culture, Respiratory w Gram Stain     Status: None (Preliminary result)   Collection Time: 11/01/21  8:30 AM   Specimen: SPU  Result Value Ref Range Status   Specimen Description   Final    SPUTUM SPUTUM Performed at [facility], [address] [place], [place] [address]    Special Requests   Final    NONE Reflexed from [id number] Performed at [facility], [address] [place], [place] [address]    Gram Stain   Final    FEW WBC PRESENT, PREDOMINANTLY PMN ABUNDANT SQUAMOUS EPITHELIAL CELLS PRESENT ABUNDANT GRAM POSITIVE COCCI IN CLUSTERS MODERATE GRAM POSITIVE COCCI IN CHAINS ABUNDANT GRAM NEGATIVE RODS MODERATE GRAM POSITIVE RODS RARE YEAST    Culture   Final    CULTURE REINCUBATED FOR BETTER GROWTH Performed at [facility], [place] [address], [place], [place] [address]    Report Status PENDING  Incomplete    Labs: CBC: Recent Labs  Lab 10/29/21 2359 10/30/21 0455 10/31/21 2342 11/01/21 0824 11/02/21 0656  WBC 9.1 6.4 12.3* 3.4* 6.4  NEUTROABS 6.5  --  7.3   --   --   HGB 14.5 13.0 13.4 12.3* 11.8*  HCT 47.5 42.6 43.1 39.3 35.7*  MCV 90.6 91.2 90.0 88.1 83.6  PLT 203 146* 192 126* 650*   Basic Metabolic Panel: Recent Labs  Lab 10/29/21 2359 10/30/21 0455 10/31/21 2342 11/01/21 0824 11/02/21 0656  NA 142 138 137 136 137  K 4.6 5.0 4.2 4.2 3.4*  CL 108 106 102 106 97*  CO2 '27 27 25 25 30  '$ GLUCOSE 158* 245* 168* 238* 195*  BUN 32* 34* 43* 40* 38*  CREATININE 2.39* 2.14* 1.76* 1.52* 1.35*  CALCIUM 8.6* 8.7* 8.5* 8.9 9.0  MG 1.8  --  1.9  --   --    Liver Function Tests: Recent Labs  Lab 10/29/21 2359 10/31/21 2342 11/01/21 0824  AST 39 36 26  ALT 34 42 40  ALKPHOS 86 116 102  BILITOT 2.1* 2.0* 1.7*  PROT 7.9 7.1 6.6  ALBUMIN 4.5 4.0 3.3*   CBG: Recent Labs  Lab 11/01/21 1229  11/01/21 2020 11/02/21 0754 11/02/21 1241 11/02/21 [id number]  GLUCAP 222* 198* 193* 210* 187*    Discharge time spent: {LESS THAN/GREATER THAN:26388} 30 minutes.  Signed: Annita Brod, MD [facility] 11/02/2021

## 2021-11-02 NOTE — Progress Notes (Signed)
Initial Nutrition Assessment  DOCUMENTATION CODES:   Severe malnutrition in context of chronic illness  INTERVENTION:   Ensure Max protein supplement BID, each supplement provides 150kcal and 30g of protein.  MVI po daily   Pt at high refeed risk; recommend monitor potassium, magnesium and phosphorus labs daily until stable  NUTRITION DIAGNOSIS:   Severe Malnutrition related to chronic illness (COPD, substance abuse) as evidenced by severe fat depletion, severe muscle depletion.  GOAL:   Patient will meet greater than or equal to 90% of their needs  MONITOR:   PO intake, Supplement acceptance, Labs, Weight trends, Skin, I & O's  REASON FOR ASSESSMENT:   Consult Assessment of nutrition requirement/status  ASSESSMENT:   71 year old male with past medical history of systolic heart failure with recent echocardiogram noting ejection fraction of 45%, polysubstance abuse including IV drug use, COPD, GERD, CAD and hypertension who presented to the emergency room on 7/1 with shortness of breath and AKI.  Met with pt in room today. Pt reports good appetite and oral intake at baseline but reports that his oral intake is decreased in the hospital. Pt reports eating only ice cream for breakfast this morning and no lunch but pt is documented to have eaten 75% of his breakfast. Spoke with pt regarding the importance of adequate nutrition needed to preserve lean muscle. Pt is willing to drink chocolate supplements in hospital. RD will add supplements to help pt meet his estimated needs. Pt is likely at refeed risk. Per chart, pt is down 15lbs(8%) over the past month; this is significant weight loss. Suspect pt with poor oral intake at baseline r/t substance abuse.   Medications reviewed and include: aspirin, azithromycin, plavix, lasix, insulin, MVI, protonix, nicotine, thiamine, ceftriaxone, heparin   Labs reviewed: K 3.4(L), BUN 38(H), creat 1.35(H) Cbgs- 210, 193 x 24 hrs AIC 6.5(H)-  7/3  NUTRITION - FOCUSED PHYSICAL EXAM:  Flowsheet Row Most Recent Value  Orbital Region Mild depletion  Upper Arm Region Severe depletion  Thoracic and Lumbar Region Severe depletion  Buccal Region Moderate depletion  Temple Region Moderate depletion  Clavicle Bone Region Severe depletion  Clavicle and Acromion Bone Region Severe depletion  Scapular Bone Region Severe depletion  Dorsal Hand Severe depletion  Patellar Region Moderate depletion  Anterior Thigh Region Moderate depletion  Posterior Calf Region Moderate depletion  Edema (RD Assessment) None  Hair Reviewed  Eyes Reviewed  Mouth Reviewed  Skin Reviewed  Nails Reviewed   Diet Order:   Diet Order             Diet heart healthy/carb modified Room service appropriate? Yes; Fluid consistency: Thin  Diet effective now                  EDUCATION NEEDS:   Education needs have been addressed  Skin:  Skin Assessment: Reviewed RN Assessment (ecchymosis)  Last BM:  7/2  Height:   Ht Readings from Last 1 Encounters:  11/01/21 '6\' 1"'  (1.854 m)    Weight:   Wt Readings from Last 1 Encounters:  11/02/21 74.9 kg    Ideal Body Weight:  83.6 kg  BMI:  Body mass index is 21.79 kg/m.  Estimated Nutritional Needs:   Kcal:  2100-2400kcal/day  Protein:  105-120g/day  Fluid:  1.9-2.2L/day  Koleen Distance MS, RD, LDN Please refer to [facility] for RD and/or RD on-call/weekend/after hours pager

## 2021-11-04 LAB — BLOOD GAS, VENOUS
Acid-base deficit: 2 mmol/L (ref 0.0–2.0)
Bicarbonate: 27.7 mmol/L (ref 20.0–28.0)
O2 Saturation: 70.6 %
Patient temperature: 37
pCO2, Ven: 71 mmHg (ref 44–60)
pH, Ven: 7.2 — ABNORMAL LOW (ref 7.25–7.43)
pO2, Ven: 46 mmHg — ABNORMAL HIGH (ref 32–45)

## 2021-11-04 LAB — CULTURE, RESPIRATORY W GRAM STAIN: Culture: NORMAL

## 2022-05-25 ENCOUNTER — Ambulatory Visit: Payer: Medicare HMO | Admitting: Internal Medicine

## 2022-06-02 DEATH — deceased
